# Patient Record
Sex: Female | Born: 1985 | Race: White | Hispanic: No | Marital: Married | Smoking: Former smoker
Health system: Southern US, Community
[De-identification: ages and names within clinical notes are randomized; demographics above are authoritative.]

## PROBLEM LIST (undated history)

## (undated) ENCOUNTER — Inpatient Hospital Stay (HOSPITAL_COMMUNITY): Payer: Self-pay

## (undated) DIAGNOSIS — R768 Other specified abnormal immunological findings in serum: Secondary | ICD-10-CM

## (undated) DIAGNOSIS — R87629 Unspecified abnormal cytological findings in specimens from vagina: Secondary | ICD-10-CM

## (undated) DIAGNOSIS — F419 Anxiety disorder, unspecified: Secondary | ICD-10-CM

## (undated) DIAGNOSIS — O039 Complete or unspecified spontaneous abortion without complication: Secondary | ICD-10-CM

## (undated) HISTORY — DX: Anxiety disorder, unspecified: F41.9

## (undated) HISTORY — PX: CERVICAL BIOPSY  W/ LOOP ELECTRODE EXCISION: SUR135

## (undated) HISTORY — PX: OTHER SURGICAL HISTORY: SHX169

## (undated) HISTORY — PX: WISDOM TOOTH EXTRACTION: SHX21

---

## 2007-05-26 ENCOUNTER — Emergency Department (HOSPITAL_COMMUNITY): Admission: EM | Admit: 2007-05-26 | Discharge: 2007-05-26 | Payer: Self-pay | Admitting: Emergency Medicine

## 2007-10-17 ENCOUNTER — Ambulatory Visit: Payer: Self-pay | Admitting: Obstetrics & Gynecology

## 2007-10-19 ENCOUNTER — Ambulatory Visit (HOSPITAL_COMMUNITY): Admission: RE | Admit: 2007-10-19 | Discharge: 2007-10-19 | Payer: Self-pay | Admitting: Obstetrics and Gynecology

## 2007-10-24 ENCOUNTER — Ambulatory Visit: Payer: Self-pay | Admitting: *Deleted

## 2007-10-25 ENCOUNTER — Ambulatory Visit: Payer: Self-pay | Admitting: Obstetrics & Gynecology

## 2007-10-25 ENCOUNTER — Inpatient Hospital Stay (HOSPITAL_COMMUNITY): Admission: AD | Admit: 2007-10-25 | Discharge: 2007-10-25 | Payer: Self-pay | Admitting: Obstetrics & Gynecology

## 2007-10-25 ENCOUNTER — Inpatient Hospital Stay (HOSPITAL_COMMUNITY): Admission: AD | Admit: 2007-10-25 | Discharge: 2007-10-28 | Payer: Self-pay | Admitting: Obstetrics & Gynecology

## 2007-11-13 ENCOUNTER — Other Ambulatory Visit: Payer: Self-pay | Admitting: Family Medicine

## 2007-11-13 ENCOUNTER — Ambulatory Visit: Payer: Self-pay | Admitting: Gynecology

## 2007-11-13 ENCOUNTER — Observation Stay (HOSPITAL_COMMUNITY): Admission: AD | Admit: 2007-11-13 | Discharge: 2007-11-14 | Payer: Self-pay | Admitting: Gynecology

## 2007-11-13 ENCOUNTER — Other Ambulatory Visit: Payer: Self-pay

## 2008-02-11 ENCOUNTER — Emergency Department (HOSPITAL_COMMUNITY): Admission: EM | Admit: 2008-02-11 | Discharge: 2008-02-11 | Payer: Self-pay | Admitting: Emergency Medicine

## 2008-03-28 IMAGING — US US OB LIMITED
1 series · 14 of 19 positions shown · non-contrast
Comparison: None.

CLINICAL DATA: Abdominal pain following an injury. Approximately 5 months
pregnant.

LIMITED OBSTETRICAL ULTRASOUND

[Series 1: unknown · 0.32mm/px · 14 of 19 slices shown]
[im 1/19]
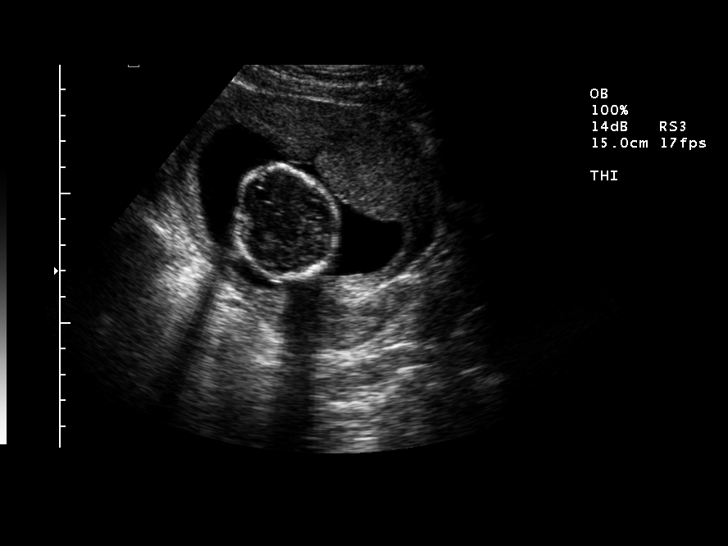
[im 3/19]
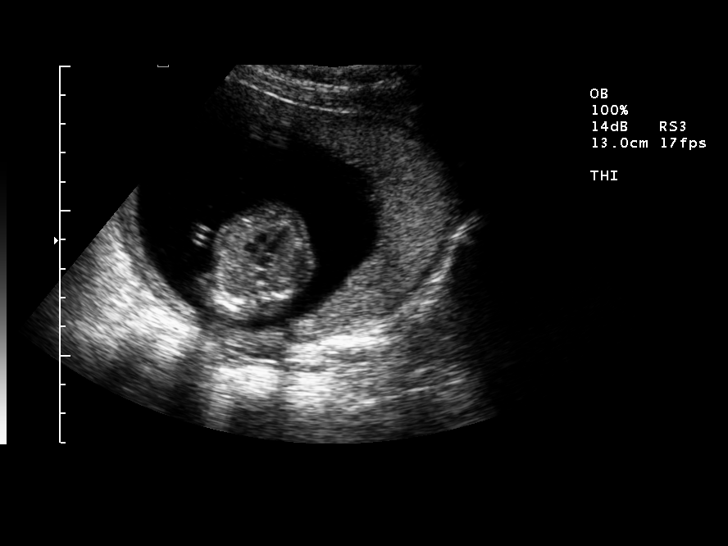
[im 4/19]
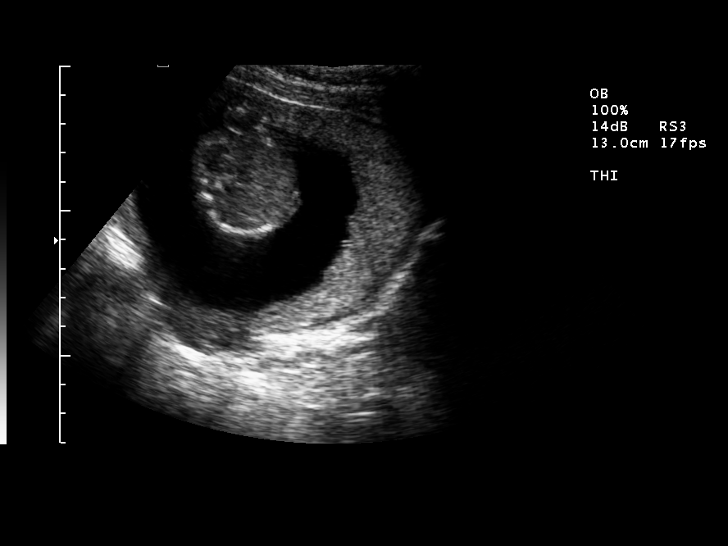
[im 5/19]
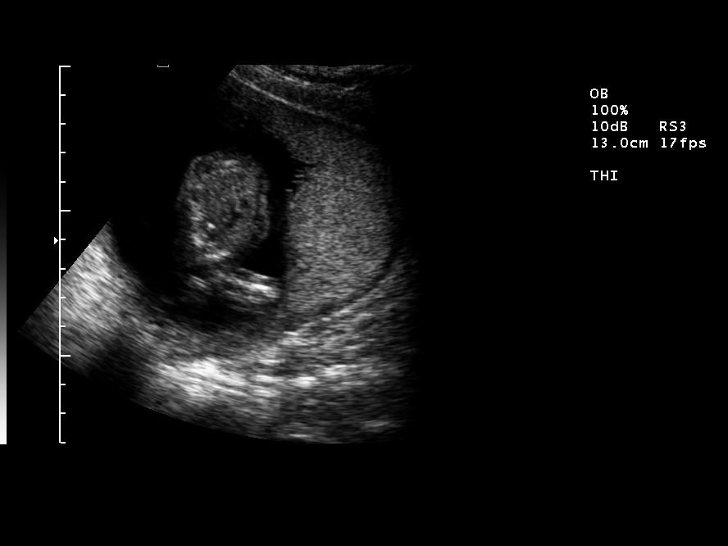
[im 7/19]
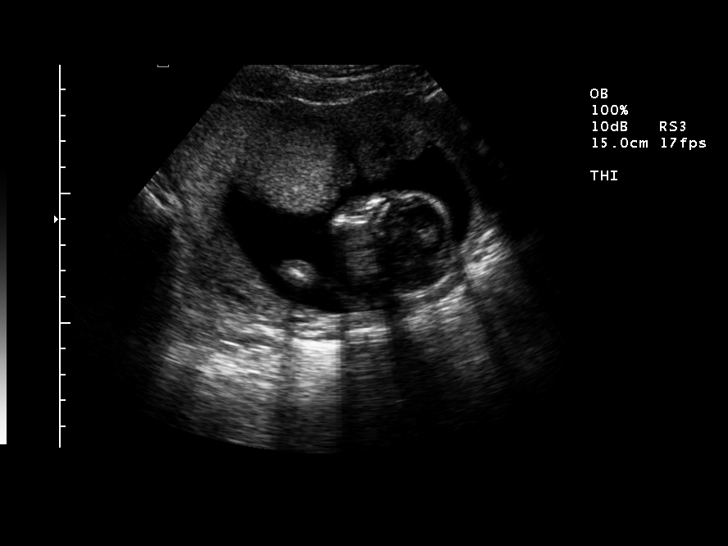
[im 8/19]
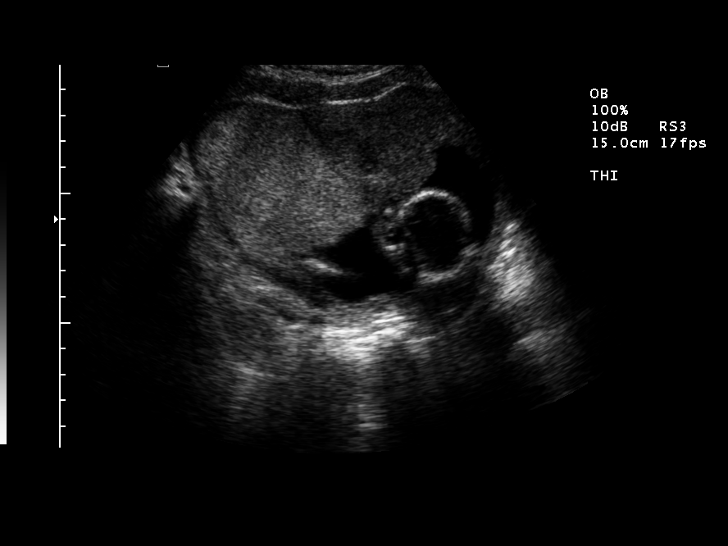
[im 9/19]
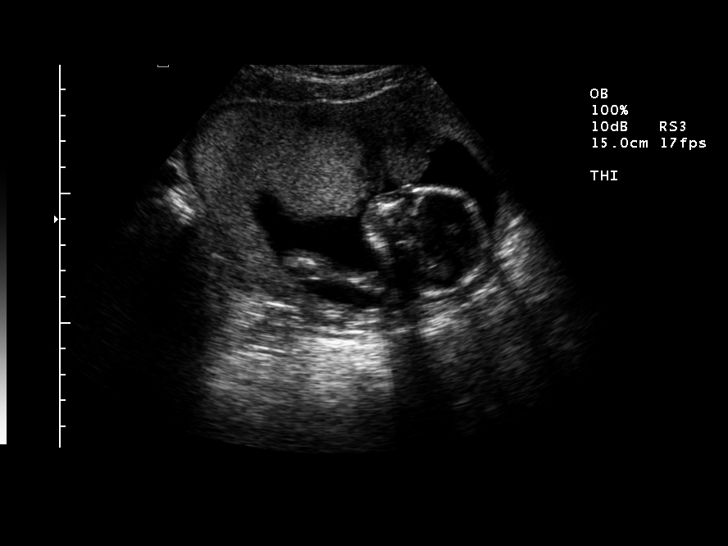
[im 11/19]
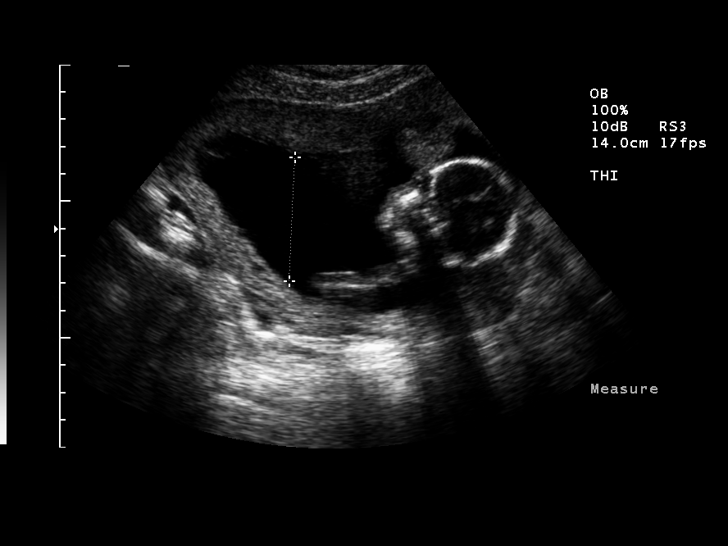
[im 12/19]
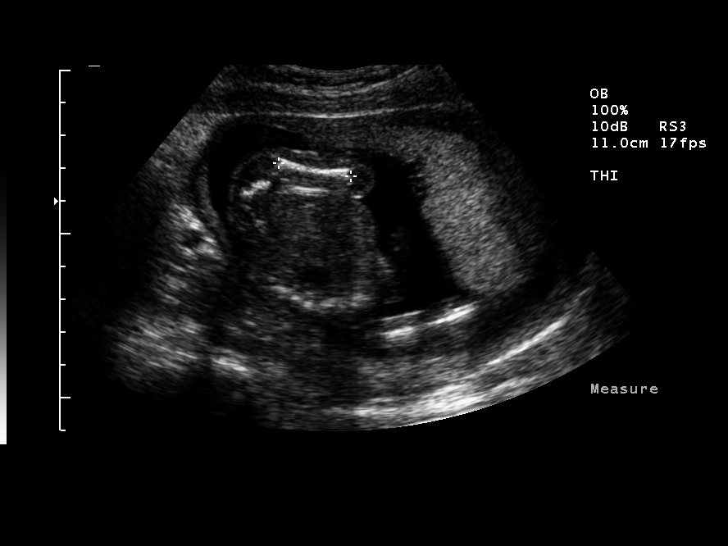
[im 13/19]
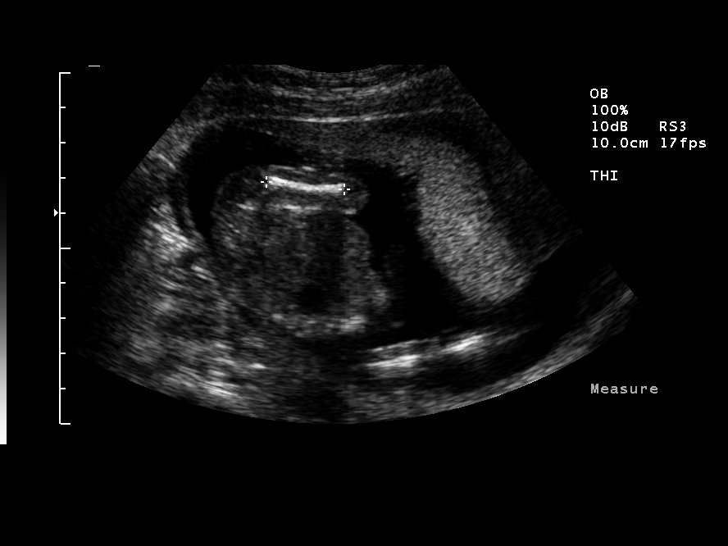
[im 15/19]
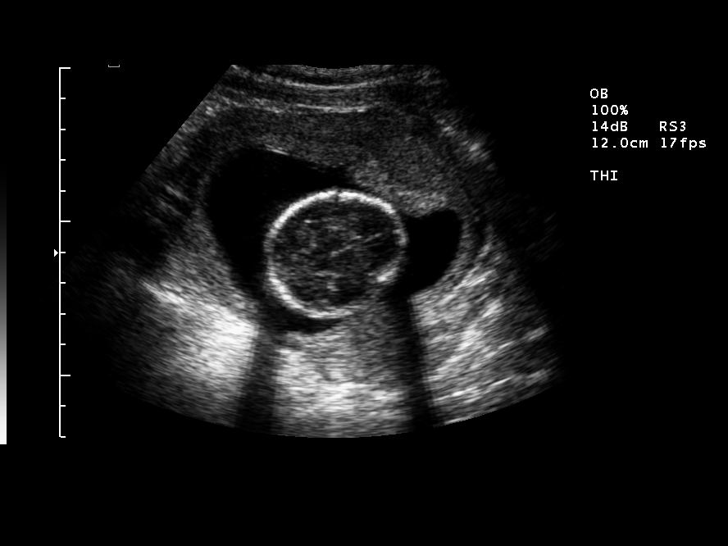
[im 16/19]
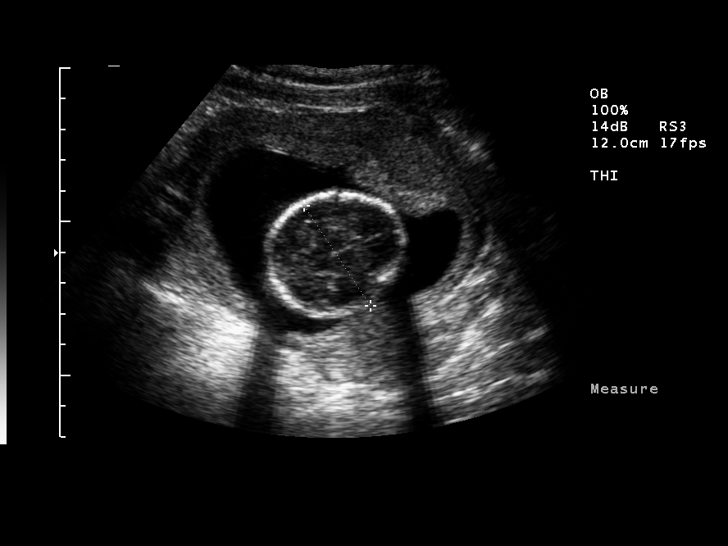
[im 17/19]
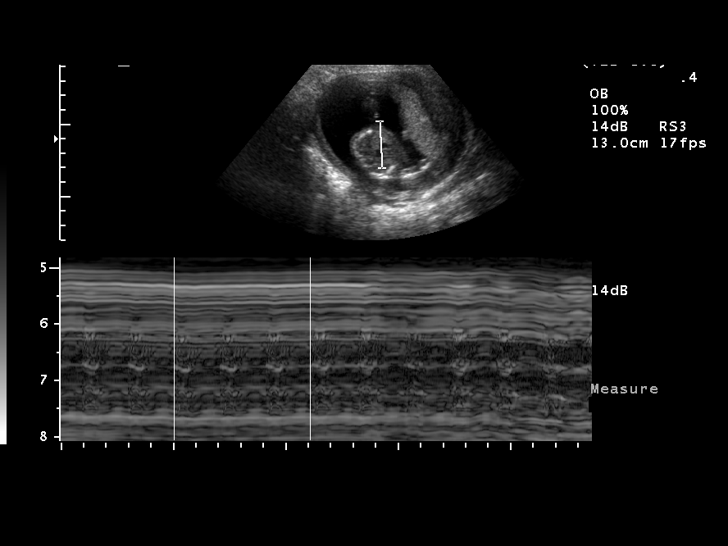
[im 19/19]
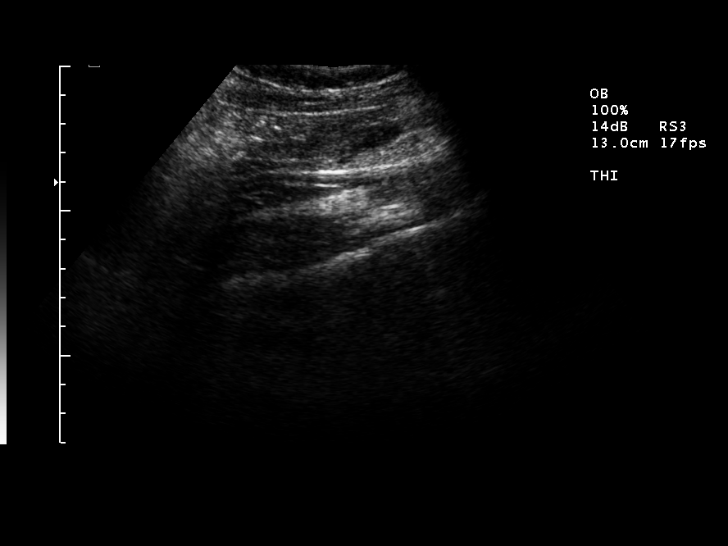

[14 of 19 positions shown; findings below may reference images not displayed]

FINDINGS: Transabdominal sonographic examination of the gravid uterus
demonstrates a single intrauterine gestation in a transverse presentation. A
normal amount of amniotic fluid is present and the placenta is located laterally
on the right without evidence of previa. Normal fetal cardiac and limb activity
with a fetal heart rate of 149 beats per minute.

The fetal BPD is 3.9 cm, giving an estimated gestational age of 17 weeks and 6
days. The femur length is 2.23 cm, giving an estimated gestational age of 16
weeks and 5 days. The maternal cervix is closed. Neither maternal ovary was
visualized.

IMPRESSION

Single live intrauterine gestation in a transverse presentation with an
estimated gestational age by today's measurements of 17 weeks and 2 days. No
complicating features visualized.

## 2008-08-21 IMAGING — US US OB COMP +14 WK
1 series · 14 of 28 positions shown · non-contrast
Comparison: none

OBSTETRICAL ULTRASOUND:

 This ultrasound exam was performed in the [HOSPITAL] Ultrasound Department.  The OB US report was generated in the AS system, and faxed to the ordering physician.  This report is also available in [REDACTED] PACS.

[Series 1: us ob comp +14 wk · 0.28mm/px · 33 acquisitions, 14 frames shown]
[im 2/33]
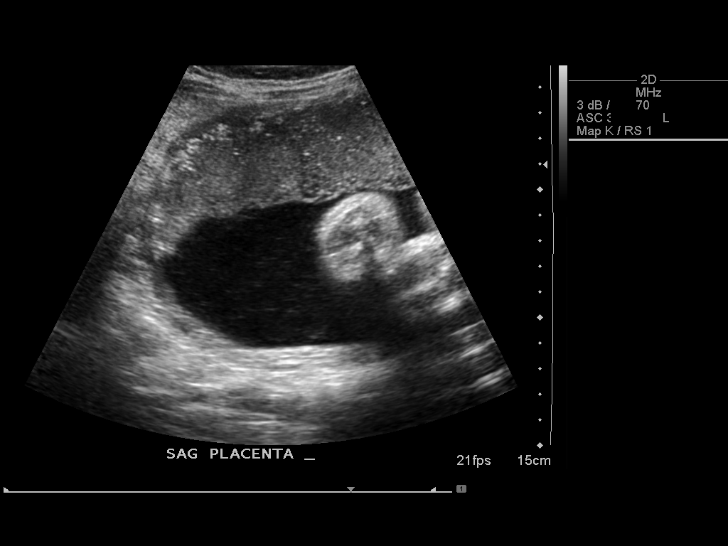
[im 4/33]
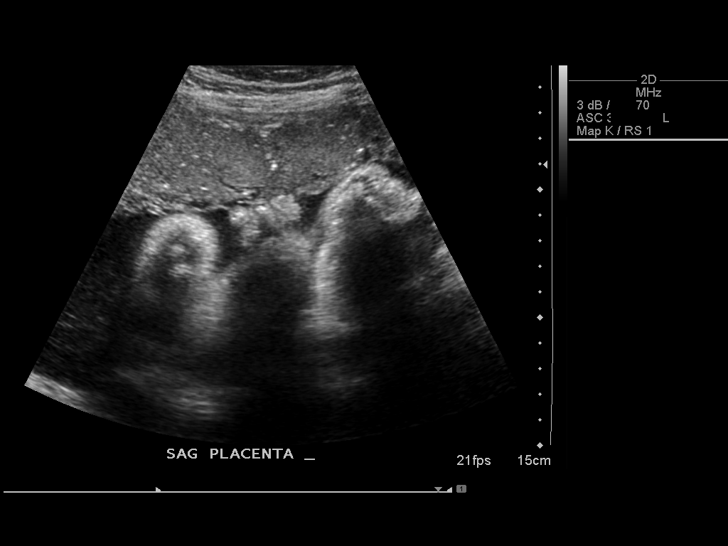
[im 6/33]
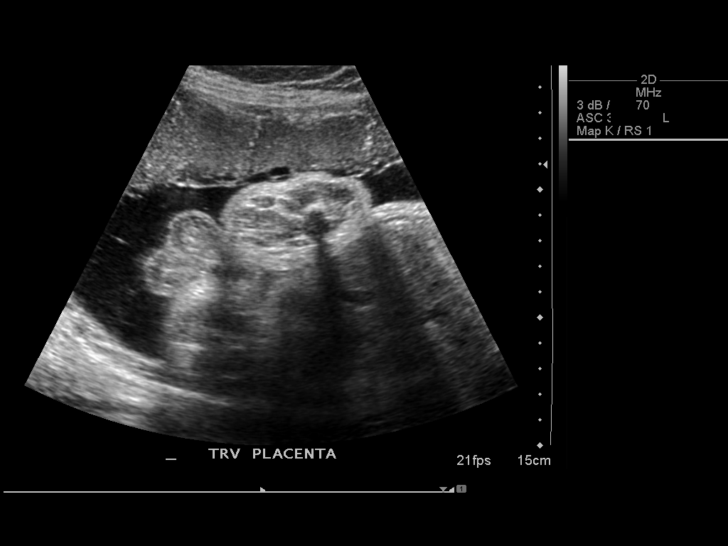
[im 9/33]
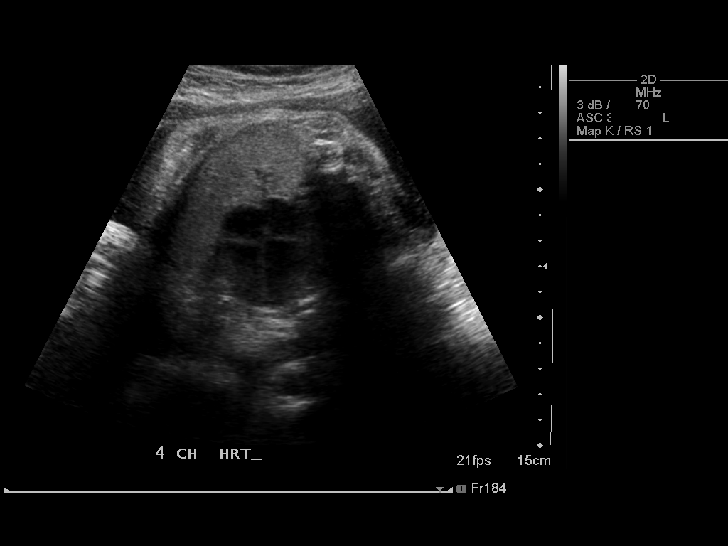
[im 11/33]
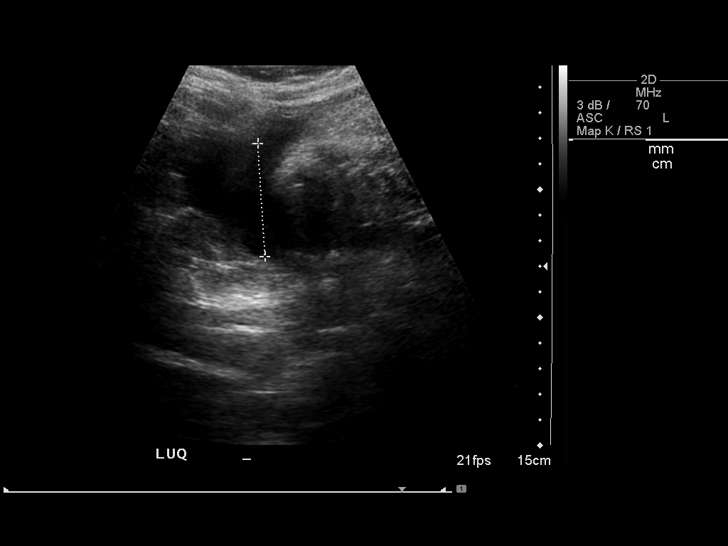
[im 14/33]
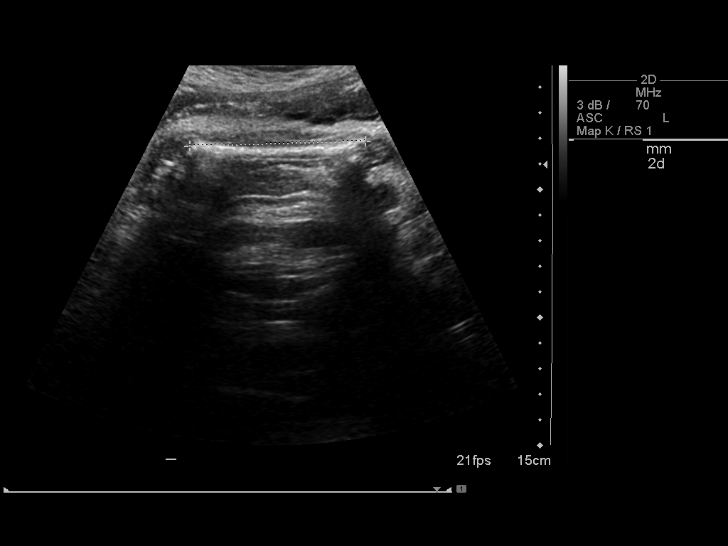
[im 16/33]
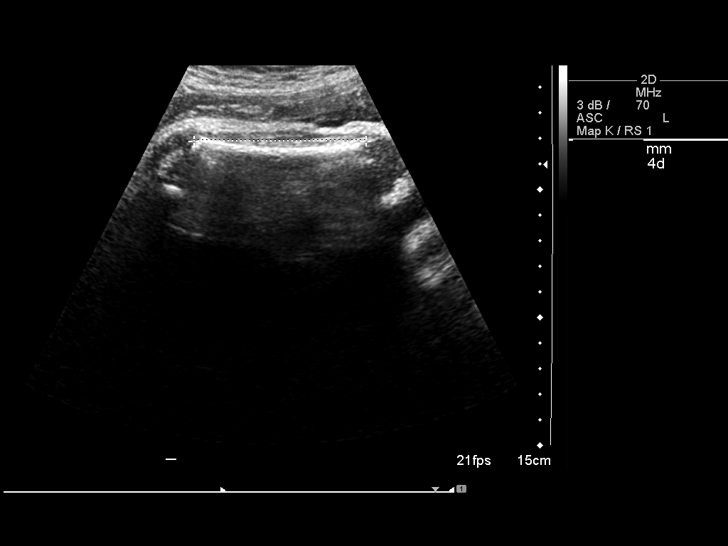
[im 18/33]
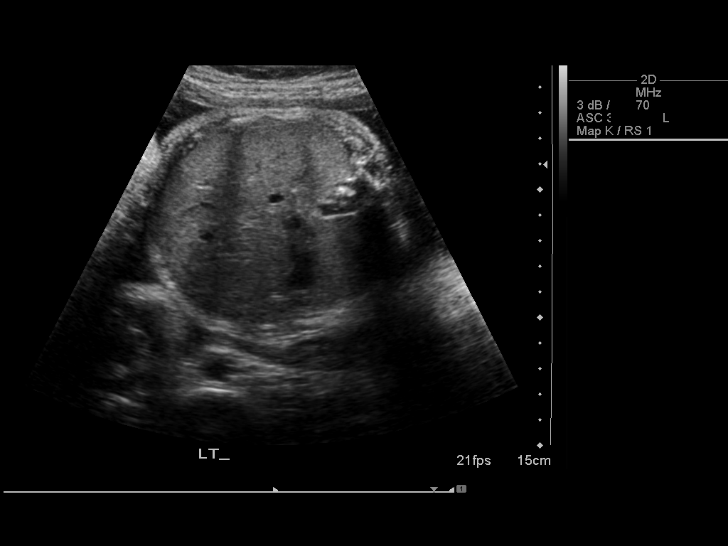
[im 21/33]
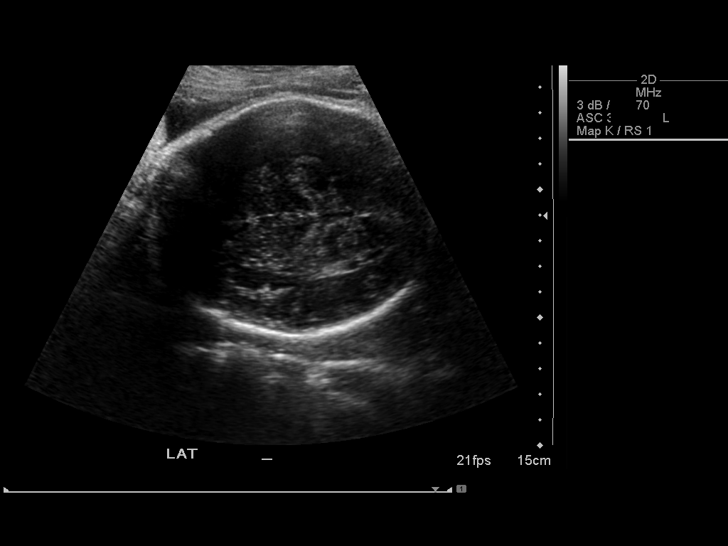
[im 23/33]
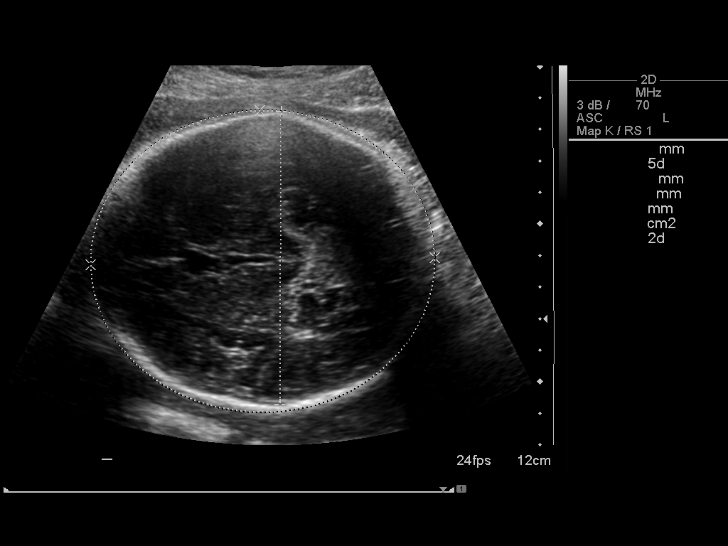
[im 25/33]
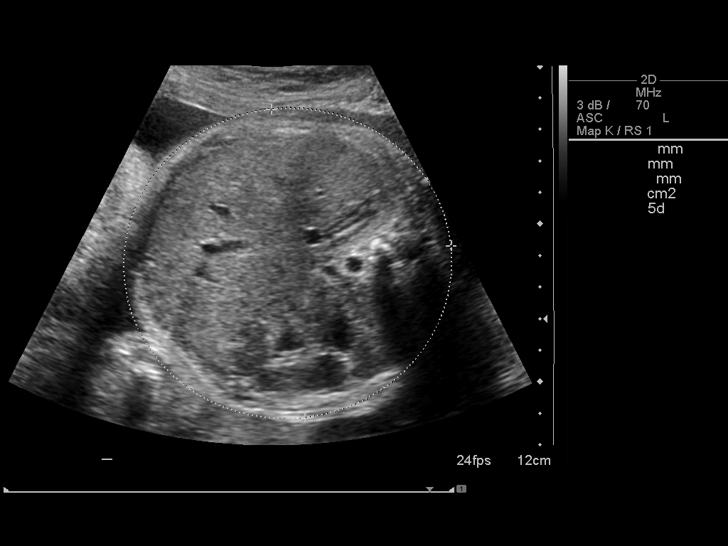
[im 28/33]
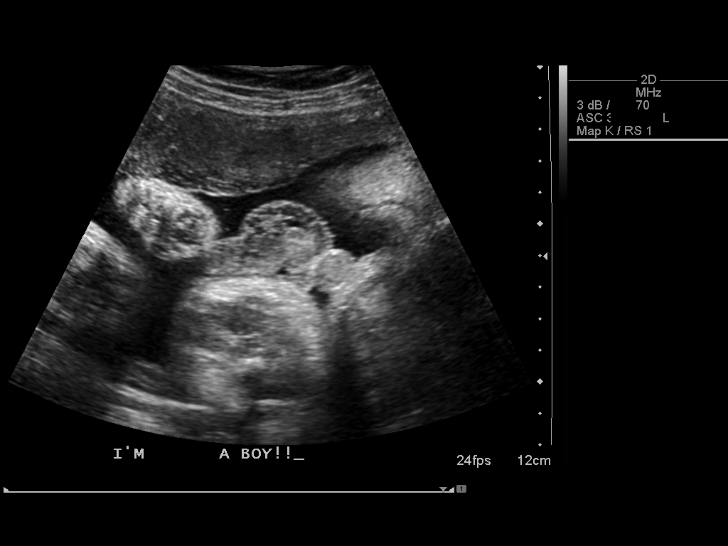
[im 30/33]
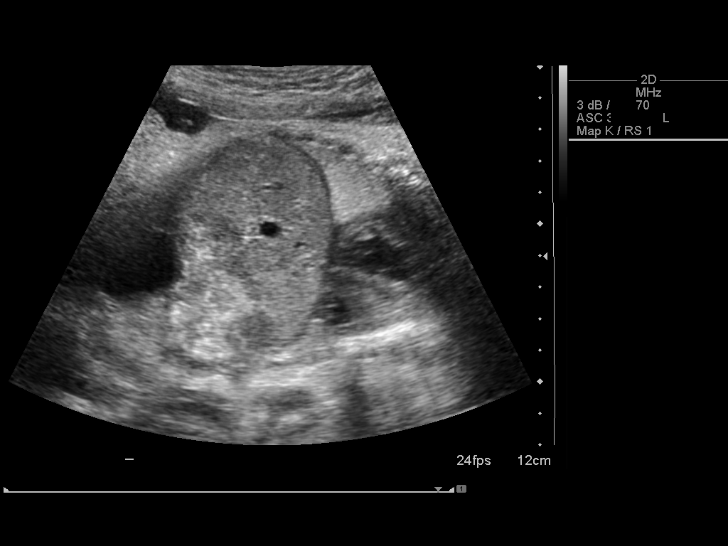
[im 33/33]
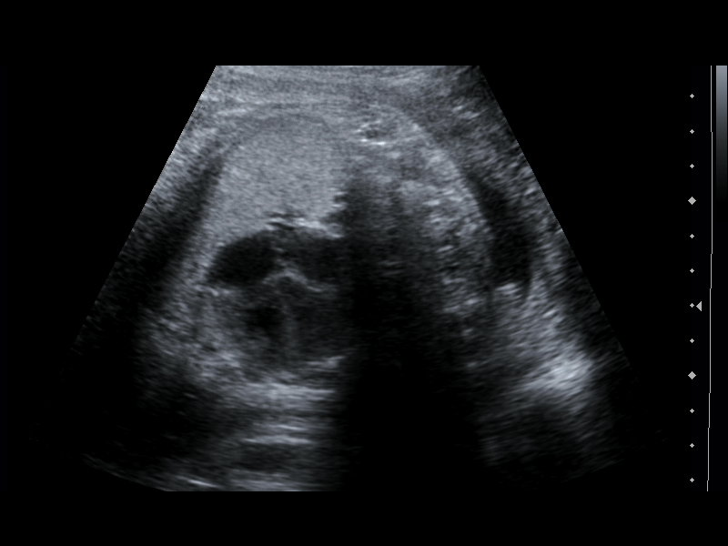

[14 of 28 positions shown; findings below may reference images not displayed]

IMPRESSION: See [facility] Obstetric US report.

## 2008-09-15 IMAGING — US US TRANSVAGINAL NON-OB
1 series · 14 of 25 positions shown · non-contrast
Comparison: none

CLINICAL DATA: Postpartum with abdominal pain.  Rule out retained products of conception.  
TRANSABDOMINAL AND TRANSVAGINAL PELVIC ULTRASOUND:
TECHNIQUE: Both transabdominal and transvaginal ultrasound examinations of the pelvis were performed including evaluation of the uterus, ovaries, adnexal regions, and pelvic cul-de-sac.

[Series 1: us transvaginal non-ob · 0.28mm/px · 14 of 55 slices shown]
[im 1/55]
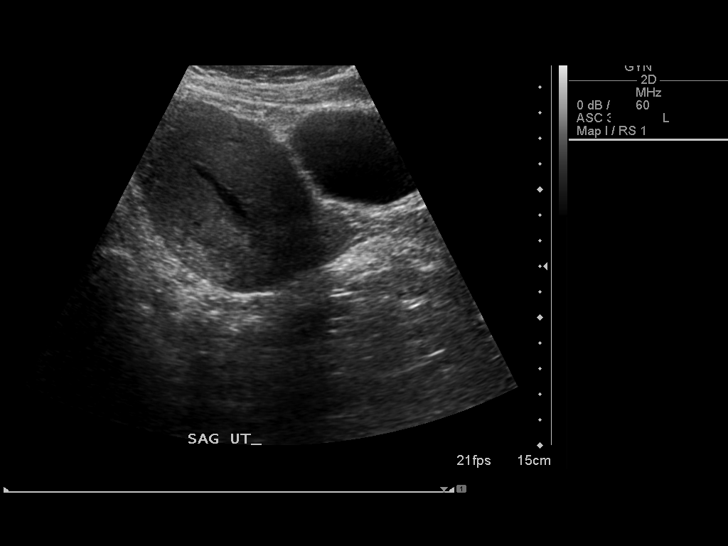
[im 5/55]
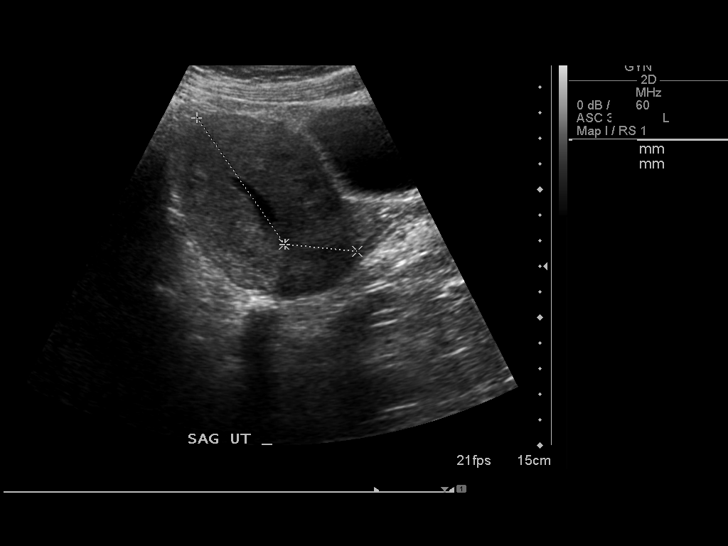
[im 10/55]
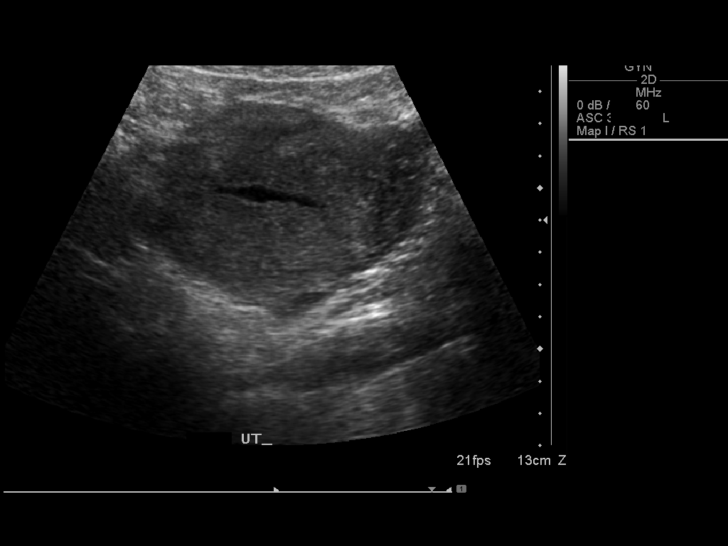
[im 14/55]
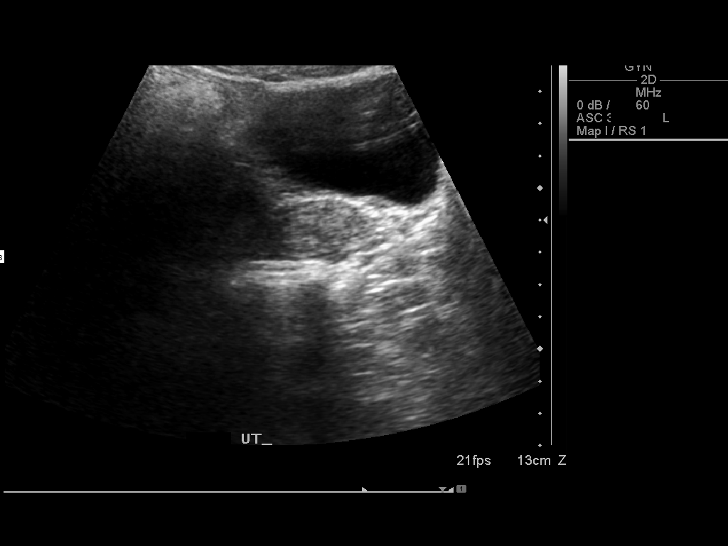
[im 19/55]
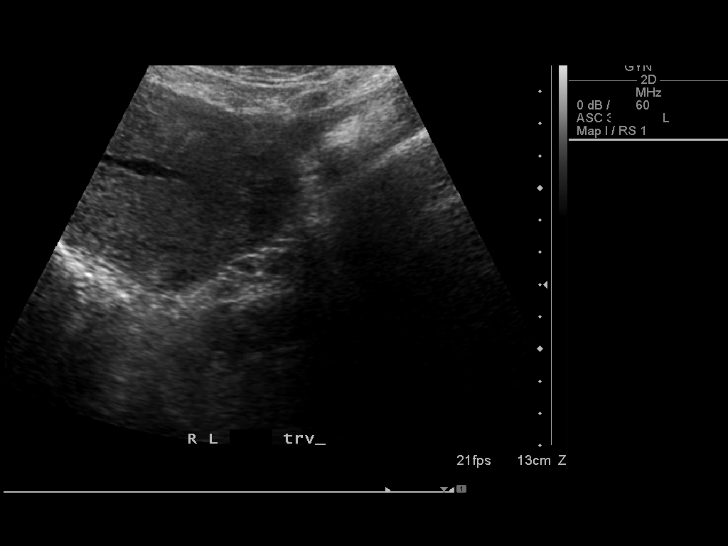
[im 21/55]
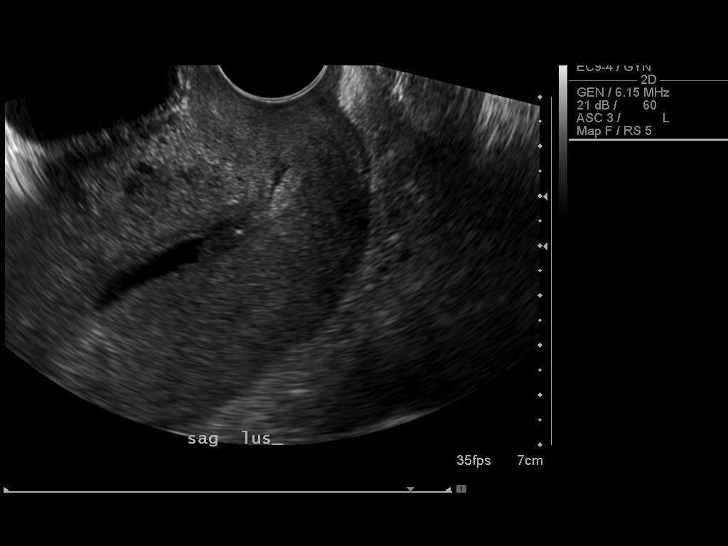
[im 25/55]
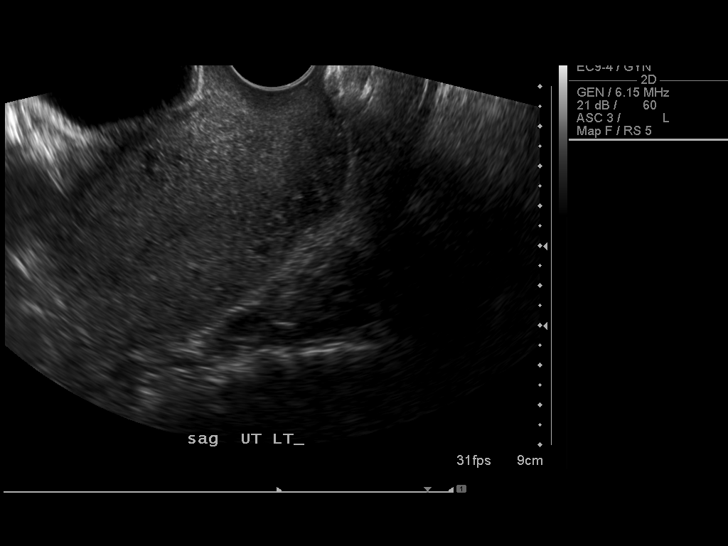
[im 30/55]
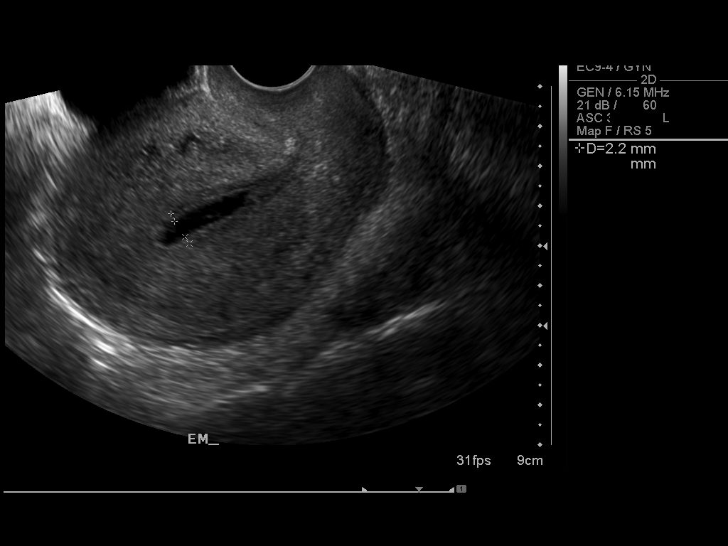
[im 34/55]
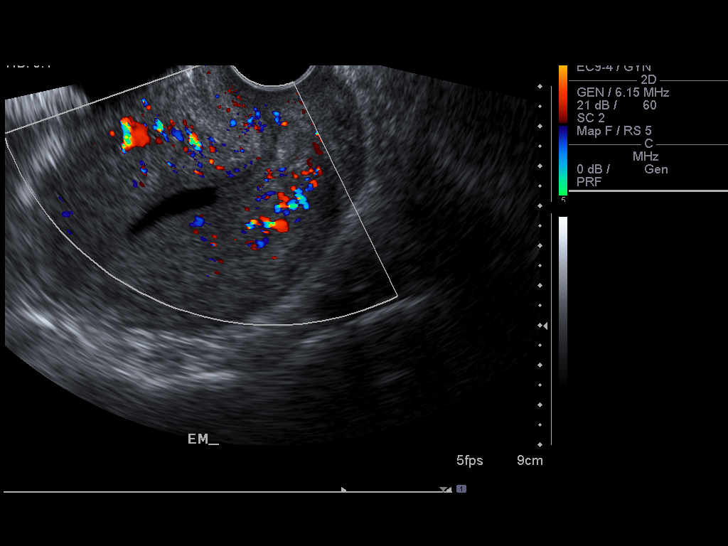
[im 37/55]
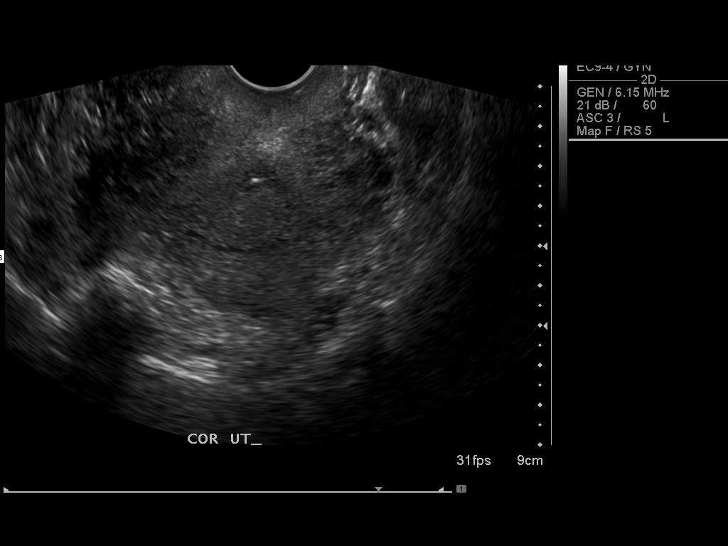
[im 41/55]
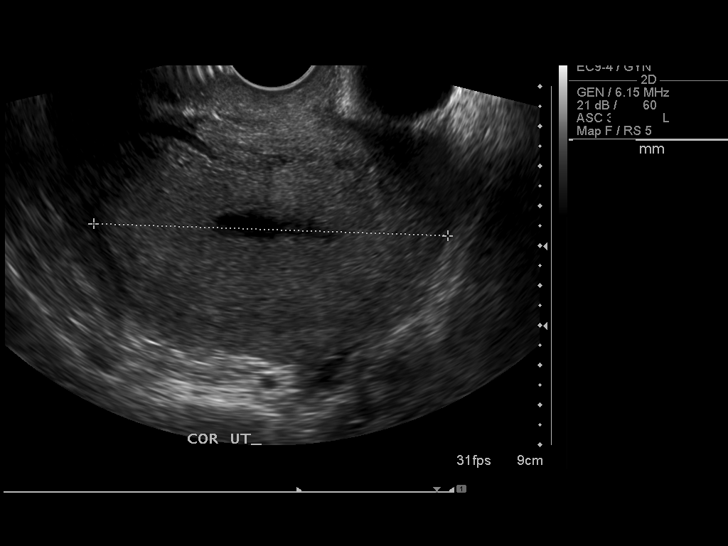
[im 46/55]
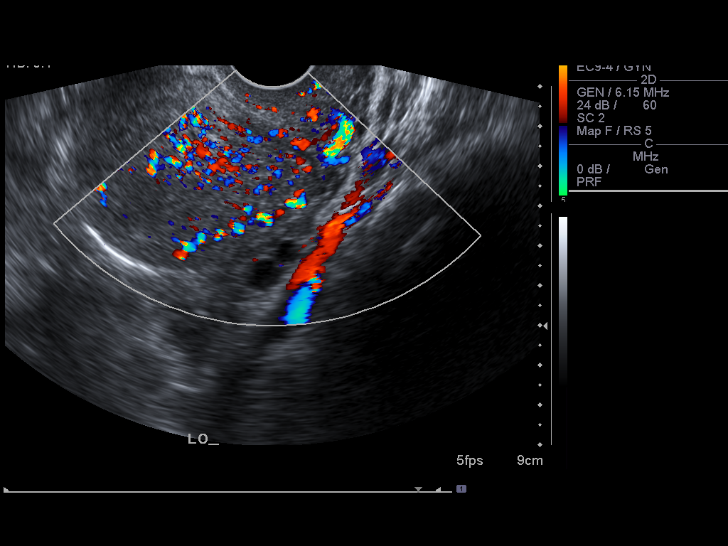
[im 50/55]
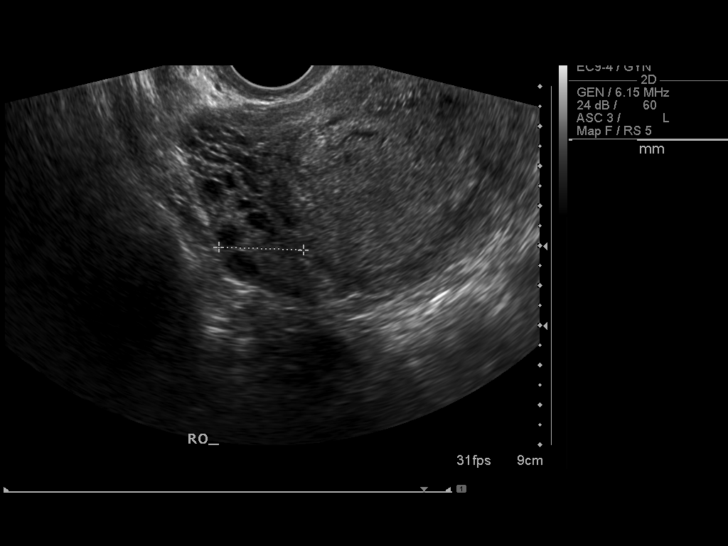
[im 55/55]
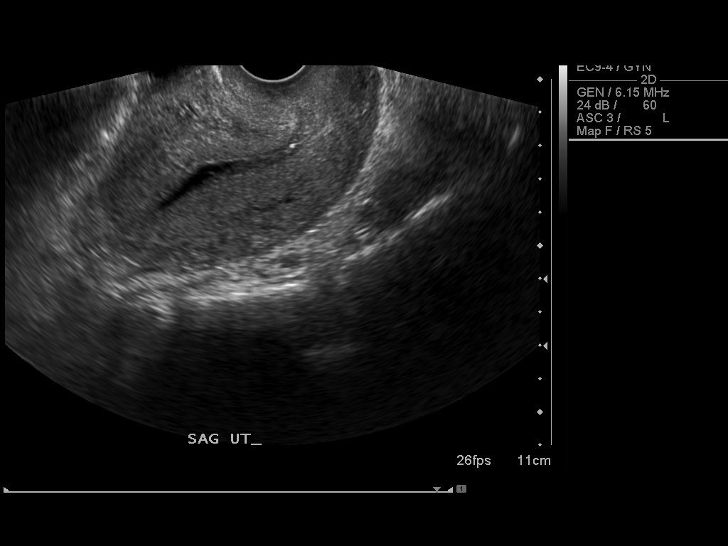

[14 of 25 positions shown; findings below may reference images not displayed]

FINDINGS: Uterus measures 9.0 x 5.6 x 9.0 cm.  There is fluid within the endometrial canal body/fundus region.  Within the lower uterine segment, echogenic material may represent a blood clot.  Small regions of retained products of conception cannot be completely excluded nor can endometritis be excluded based on the current exam.  
Right ovary measures 4.5 x 2.0 x 2.1 cm and the left ovary 3.6 x 1.1 x 1.8 cm.  Scattered small follicles without dominant mass.  No free fluid.
IMPRESSION: Fluid within the endometrial canal at the fundal and body aspect of the uterus.  Below this region in the lower uterine segment, echogenic material is noted.  This may represent clot although retained products of conception cannot be excluded.  Additionally, endometritis cannot be excluded based on these findings.

## 2008-10-07 ENCOUNTER — Emergency Department (HOSPITAL_COMMUNITY): Admission: EM | Admit: 2008-10-07 | Discharge: 2008-10-07 | Payer: Self-pay | Admitting: Emergency Medicine

## 2011-06-23 LAB — CBC
MCHC: 33.7
MCV: 86.6
Platelets: 300
RDW: 13.7
WBC: 22.7 — ABNORMAL HIGH

## 2011-06-23 LAB — POCT URINALYSIS DIP (DEVICE)
Operator id: 297281
Operator id: 297281
Protein, ur: NEGATIVE
Protein, ur: NEGATIVE
Specific Gravity, Urine: 1.025
Specific Gravity, Urine: 1.02
Urobilinogen, UA: 0.2
Urobilinogen, UA: 0.2
pH: 6
pH: 7

## 2011-06-23 LAB — RPR: RPR Ser Ql: NONREACTIVE

## 2011-06-23 LAB — RH IMMUNE GLOB WKUP(>/=20WKS)(NOT WOMEN'S HOSP)

## 2011-06-24 LAB — DIFFERENTIAL
Basophils Absolute: 0.1
Eosinophils Absolute: 0
Eosinophils Relative: 0
Lymphocytes Relative: 7 — ABNORMAL LOW
Lymphs Abs: 1
Neutrophils Relative %: 90 — ABNORMAL HIGH

## 2011-06-24 LAB — URINALYSIS, ROUTINE W REFLEX MICROSCOPIC
Nitrite: NEGATIVE
Specific Gravity, Urine: 1.041 — ABNORMAL HIGH
Urobilinogen, UA: 1
pH: 6

## 2011-06-24 LAB — GC/CHLAMYDIA PROBE AMP, GENITAL
Chlamydia, DNA Probe: NEGATIVE
GC Probe Amp, Genital: NEGATIVE

## 2011-06-24 LAB — URINE MICROSCOPIC-ADD ON

## 2011-06-24 LAB — CBC
HCT: 32.6 — ABNORMAL LOW
HCT: 36.1
MCHC: 33.9
MCV: 86.4
Platelets: 280
RBC: 3.77 — ABNORMAL LOW
RDW: 14.4
WBC: 10.5
WBC: 14.4 — ABNORMAL HIGH

## 2011-06-24 LAB — COMPREHENSIVE METABOLIC PANEL
BUN: 7
CO2: 22
Calcium: 7.8 — ABNORMAL LOW
Chloride: 110
Creatinine, Ser: 0.65
GFR calc non Af Amer: >60
Glucose, Bld: 112 — ABNORMAL HIGH
Total Bilirubin: 0.4

## 2011-06-24 LAB — BASIC METABOLIC PANEL
BUN: 9
Creatinine, Ser: 0.79
GFR calc non Af Amer: >60
Glucose, Bld: 102 — ABNORMAL HIGH

## 2011-06-24 LAB — PREGNANCY, URINE: Preg Test, Ur: NEGATIVE

## 2011-06-24 LAB — URINE CULTURE

## 2011-07-15 LAB — URINALYSIS, ROUTINE W REFLEX MICROSCOPIC
Hgb urine dipstick: NEGATIVE
Nitrite: NEGATIVE
Protein, ur: NEGATIVE
Specific Gravity, Urine: 1.017
Urobilinogen, UA: 1

## 2011-07-15 LAB — CBC
MCV: 87.3
Platelets: 245
RBC: 4.05
WBC: 10.2

## 2011-07-15 LAB — DIFFERENTIAL
Eosinophils Absolute: 0
Lymphs Abs: 2
Neutro Abs: 7.8 — ABNORMAL HIGH
Neutrophils Relative %: 77

## 2011-07-15 LAB — HCG, QUANTITATIVE, PREGNANCY: hCG, Beta Chain, Quant, S: 13480 — ABNORMAL HIGH

## 2011-07-15 LAB — PREGNANCY, URINE: Preg Test, Ur: POSITIVE

## 2011-07-15 LAB — ABO/RH: ABO/RH(D): O NEG

## 2014-04-16 ENCOUNTER — Other Ambulatory Visit: Payer: Self-pay

## 2014-04-16 ENCOUNTER — Encounter (HOSPITAL_COMMUNITY): Payer: Self-pay | Admitting: Emergency Medicine

## 2014-04-16 ENCOUNTER — Emergency Department (HOSPITAL_COMMUNITY)
Admission: EM | Admit: 2014-04-16 | Discharge: 2014-04-17 | Disposition: A | Discharge status: Home / Self Care | Payer: Medicaid Other | Attending: Emergency Medicine | Admitting: Emergency Medicine

## 2014-04-16 DIAGNOSIS — N939 Abnormal uterine and vaginal bleeding, unspecified: Principal | ICD-10-CM | POA: Insufficient documentation

## 2014-04-16 DIAGNOSIS — M549 Dorsalgia, unspecified: Secondary | ICD-10-CM | POA: Diagnosis not present

## 2014-04-16 DIAGNOSIS — N926 Irregular menstruation, unspecified: Secondary | ICD-10-CM | POA: Insufficient documentation

## 2014-04-16 DIAGNOSIS — Z3202 Encounter for pregnancy test, result negative: Secondary | ICD-10-CM | POA: Insufficient documentation

## 2014-04-16 DIAGNOSIS — Z8619 Personal history of other infectious and parasitic diseases: Secondary | ICD-10-CM | POA: Insufficient documentation

## 2014-04-16 DIAGNOSIS — R1030 Lower abdominal pain, unspecified: Secondary | ICD-10-CM

## 2014-04-16 DIAGNOSIS — R1031 Right lower quadrant pain: Secondary | ICD-10-CM | POA: Diagnosis not present

## 2014-04-16 DIAGNOSIS — N938 Other specified abnormal uterine and vaginal bleeding: Secondary | ICD-10-CM

## 2014-04-16 DIAGNOSIS — N898 Other specified noninflammatory disorders of vagina: Secondary | ICD-10-CM | POA: Diagnosis present

## 2014-04-16 DIAGNOSIS — F17200 Nicotine dependence, unspecified, uncomplicated: Secondary | ICD-10-CM | POA: Diagnosis not present

## 2014-04-16 DIAGNOSIS — R109 Unspecified abdominal pain: Secondary | ICD-10-CM | POA: Diagnosis not present

## 2014-04-16 DIAGNOSIS — R000 Tachycardia, unspecified: Secondary | ICD-10-CM | POA: Diagnosis not present

## 2014-04-16 HISTORY — DX: Other specified abnormal immunological findings in serum: R76.8

## 2014-04-16 LAB — BASIC METABOLIC PANEL
ANION GAP: 13 (ref 5–15)
BUN: 11 mg/dL (ref 6–23)
CO2: 22 meq/L (ref 19–32)
CREATININE: 0.8 mg/dL (ref 0.50–1.10)
Calcium: 9.9 mg/dL (ref 8.4–10.5)
Chloride: 105 mEq/L (ref 96–112)
GFR calc Af Amer: >90 mL/min (ref >90)
GFR calc non Af Amer: >90 mL/min (ref >90)
Glucose, Bld: 110 mg/dL — ABNORMAL HIGH (ref 70–99)
Potassium: 3.9 mEq/L (ref 3.7–5.3)
Sodium: 140 mEq/L (ref 137–147)

## 2014-04-16 LAB — CBC
HEMATOCRIT: 45.4 % (ref 36.0–46.0)
HEMOGLOBIN: 15.2 g/dL — AB (ref 12.0–15.0)
MCH: 30.6 pg (ref 26.0–34.0)
MCHC: 33.5 g/dL (ref 30.0–36.0)
MCV: 91.5 fL (ref 78.0–100.0)
Platelets: 294 10*3/uL (ref 150–400)
RBC: 4.96 MIL/uL (ref 3.87–5.11)
RDW: 13 % (ref 11.5–15.5)
WBC: 15.8 10*3/uL — AB (ref 4.0–10.5)

## 2014-04-16 LAB — WET PREP, GENITAL
TRICH WET PREP: NONE SEEN
Yeast Wet Prep HPF POC: NONE SEEN

## 2014-04-16 LAB — POC URINE PREG, ED: Preg Test, Ur: NEGATIVE

## 2014-04-16 MED ORDER — SODIUM CHLORIDE 0.9 % IV BOLUS (SEPSIS)
1000.0000 mL | Freq: Once | INTRAVENOUS | Status: AC
  Administered 2014-04-16: 1000 mL via INTRAVENOUS

## 2014-04-16 MED ORDER — IBUPROFEN 200 MG PO TABS
600.0000 mg | ORAL_TABLET | Freq: Once | ORAL | Status: AC
  Administered 2014-04-16: 600 mg via ORAL
  Filled 2014-04-16: qty 3

## 2014-04-16 NOTE — ED Notes (Signed)
RN at bedside starting IV 

## 2014-04-16 NOTE — ED Notes (Signed)
Pt states she has been on her period for going on 4 mths  Pt states she has pain in her back and also having sharp shooting pain in her pelvic region   Pt states she thinks she may also have a sinus infection

## 2014-04-16 NOTE — ED Provider Notes (Signed)
CSN: [id number]     Arrival date & time 04/16/14  1951 History   First MD Initiated Contact with Patient 04/16/14 2221     Chief Complaint  Patient presents with  . Vaginal Bleeding     (Consider location/radiation/quality/duration/timing/severity/associated sxs/prior Treatment) HPI 28 year old female presents with vaginal bleeding for the last 4 months. She states she's feeling she's been on her period this entire time. She goes through a pad every couple days. Sometimes she has clots. No urinary symptoms but she questions if she's having a UTI due to low back pain and lower abdominal pain for the past month. Pain is a 7/10. No fevers. No vomiting. Also having a "sinus infection" since over 1 year ago but states no medicines work for her. No congestion. Dry cough for over one year, mostly at night. Is a smoker. States her HR intermittently "goes out of rhythm" due to her mother doing cocaine during her pregnancy.  Past Medical History  Diagnosis Date  . Hepatitis B antibody positive    Past Surgical History  Procedure Laterality Date  . Extraction of wisdom teeth     Family History  Problem Relation Age of Onset  . Adopted: Yes   History  Substance Use Topics  . Smoking status: Current Every Day Smoker -- 0.25 packs/day  . Smokeless tobacco: Not on file  . Alcohol Use: Yes     Comment: rare   OB History   Grav Para Term Preterm Abortions TAB SAB Ect Mult Living                 Review of Systems  Constitutional: Negative for fever.  Gastrointestinal: Positive for abdominal pain. Negative for vomiting.  Genitourinary: Positive for vaginal bleeding and menstrual problem.  Musculoskeletal: Positive for back pain.  All other systems reviewed and are negative.     Allergies  Toradol; Augmentin; Naldecon senior; Ultram; Careers adviserAllegra; Benadryl; Sudafed; and Zyrtec  Home Medications   Prior to Admission medications   Medication Sig Start Date End Date Taking? Authorizing  Provider  etonogestrel (NEXPLANON) 68 MG IMPL implant Inject 1 each into the skin once.   Yes Historical Provider, MD   BP 139/81  Pulse 137  Temp(Src) 98.6 F (37 C) (Oral)  Resp 20  SpO2 100% Physical Exam  Nursing note and vitals reviewed. Constitutional: She is oriented to person, place, and time. She appears well-developed and well-nourished. No distress.  Laying in bed comfortably  HENT:  Head: Normocephalic and atraumatic.  Right Ear: External ear normal.  Left Ear: External ear normal.  Nose: Nose normal.  Eyes: Right eye exhibits no discharge. Left eye exhibits no discharge.  Cardiovascular: Regular rhythm and normal heart sounds.  Tachycardia present.   Pulmonary/Chest: Effort normal and breath sounds normal.  Abdominal: Soft. There is tenderness (mild) in the right lower quadrant and suprapubic area.  Genitourinary: Cervix exhibits no motion tenderness. Right adnexum displays no mass. Left adnexum displays no mass. There is bleeding (mild blood in vault) around the vagina.  No active bleeding  Neurological: She is alert and oriented to person, place, and time.  Skin: Skin is warm and dry.    ED Course  Procedures (including critical care time) Labs Review Labs Reviewed  WET PREP, GENITAL - Abnormal; Notable for the following:    Clue Cells Wet Prep HPF POC FEW (*)    WBC, Wet Prep HPF POC RARE (*)    All other components within normal limits  CBC - Abnormal;  Notable for the following:    WBC 15.8 (*)    Hemoglobin 15.2 (*)    All other components within normal limits  BASIC METABOLIC PANEL - Abnormal; Notable for the following:    Glucose, Bld 110 (*)    All other components within normal limits  GC/CHLAMYDIA PROBE AMP  URINALYSIS, ROUTINE W REFLEX MICROSCOPIC  POC URINE PREG, ED    Imaging Review No results found.   Date: 04/16/2014  Rate: 107  Rhythm: sinus tachycardia  QRS Axis: normal  Intervals: normal  ST/T Wave abnormalities: normal   Conduction Disutrbances:none  Narrative Interpretation:   Old EKG Reviewed: none available    MDM   Final diagnoses:  Dysfunctional uterine bleeding  Lower abdominal pain    Patient with multiple complaints, each at least one month in duration. She's not having any signs of symptomatic anemia. She is quite tachycardic in triage, however on recheck RA is just over 100. She was given IV fluids although it times her heart rate would jump up without symptoms. It was then returned to normal. EKG shows sinus rhythm. She's not having any chest symptoms. No signs of this is from anemia as her hemoglobin is 15. Exam is unconcerning at this time. Patient is stable for discharge. U/A pending at time of care being transferred to Dr. Hyacinth Meeker. Plan is for discharge with addition of antibiotics is urine is c/w a UTI. Will refer to GYN.    Audree Camel, MD 04/17/14 [phone]

## 2014-04-17 LAB — URINALYSIS, ROUTINE W REFLEX MICROSCOPIC
Bilirubin Urine: NEGATIVE
Glucose, UA: NEGATIVE mg/dL
Ketones, ur: NEGATIVE mg/dL
Leukocytes, UA: NEGATIVE
NITRITE: NEGATIVE
Protein, ur: NEGATIVE mg/dL
SPECIFIC GRAVITY, URINE: 1.009 (ref 1.005–1.030)
UROBILINOGEN UA: 0.2 mg/dL (ref 0.0–1.0)
pH: 5.5 (ref 5.0–8.0)

## 2014-04-17 LAB — GC/CHLAMYDIA PROBE AMP
CT Probe RNA: NEGATIVE
GC Probe RNA: NEGATIVE

## 2014-04-17 LAB — URINE MICROSCOPIC-ADD ON

## 2014-04-17 MED ORDER — HYDROCODONE-ACETAMINOPHEN 5-325 MG PO TABS: 2.0000 | ORAL_TABLET | ORAL | Status: DC | PRN

## 2014-04-17 MED ORDER — AZITHROMYCIN 250 MG PO TABS: 250.0000 mg | ORAL_TABLET | Freq: Every day | ORAL | Status: DC

## 2014-04-17 MED ORDER — HYDROCODONE-ACETAMINOPHEN 5-325 MG PO TABS
2.0000 | ORAL_TABLET | Freq: Once | ORAL | Status: AC
  Administered 2014-04-17: 2 via ORAL
  Filled 2014-04-17: qty 2

## 2014-10-11 ENCOUNTER — Emergency Department (HOSPITAL_COMMUNITY): Payer: Medicaid Other

## 2014-10-11 ENCOUNTER — Encounter (HOSPITAL_COMMUNITY): Payer: Self-pay | Admitting: Emergency Medicine

## 2014-10-11 ENCOUNTER — Emergency Department (HOSPITAL_COMMUNITY)
Admission: EM | Admit: 2014-10-11 | Discharge: 2014-10-11 | Disposition: A | Discharge status: Home / Self Care | Payer: Medicaid Other | Attending: Emergency Medicine | Admitting: Emergency Medicine

## 2014-10-11 DIAGNOSIS — R05 Cough: Secondary | ICD-10-CM | POA: Diagnosis present

## 2014-10-11 DIAGNOSIS — Z3202 Encounter for pregnancy test, result negative: Secondary | ICD-10-CM | POA: Insufficient documentation

## 2014-10-11 DIAGNOSIS — J189 Pneumonia, unspecified organism: Secondary | ICD-10-CM | POA: Insufficient documentation

## 2014-10-11 DIAGNOSIS — Z792 Long term (current) use of antibiotics: Secondary | ICD-10-CM | POA: Diagnosis not present

## 2014-10-11 DIAGNOSIS — Z79899 Other long term (current) drug therapy: Secondary | ICD-10-CM | POA: Insufficient documentation

## 2014-10-11 DIAGNOSIS — Z720 Tobacco use: Secondary | ICD-10-CM | POA: Diagnosis not present

## 2014-10-11 LAB — CBC WITH DIFFERENTIAL/PLATELET
Basophils Absolute: 0 10*3/uL (ref 0.0–0.1)
Basophils Relative: 0 % (ref 0–1)
EOS ABS: 0 10*3/uL (ref 0.0–0.7)
EOS PCT: 1 % (ref 0–5)
HEMATOCRIT: 39.3 % (ref 36.0–46.0)
HEMOGLOBIN: 12.5 g/dL (ref 12.0–15.0)
Lymphocytes Relative: 28 % (ref 12–46)
Lymphs Abs: 2.2 10*3/uL (ref 0.7–4.0)
MCH: 28.9 pg (ref 26.0–34.0)
MCHC: 31.8 g/dL (ref 30.0–36.0)
MCV: 91 fL (ref 78.0–100.0)
Monocytes Absolute: 0.6 10*3/uL (ref 0.1–1.0)
Monocytes Relative: 8 % (ref 3–12)
NEUTROS ABS: 4.9 10*3/uL (ref 1.7–7.7)
Neutrophils Relative %: 63 % (ref 43–77)
Platelets: 239 10*3/uL (ref 150–400)
RBC: 4.32 MIL/uL (ref 3.87–5.11)
RDW: 14.2 % (ref 11.5–15.5)
WBC: 7.7 10*3/uL (ref 4.0–10.5)

## 2014-10-11 LAB — COMPREHENSIVE METABOLIC PANEL
ALBUMIN: 4 g/dL (ref 3.5–5.2)
ALK PHOS: 62 U/L (ref 39–117)
ALT: 18 U/L (ref 0–35)
AST: 21 U/L (ref 0–37)
Anion gap: 9 (ref 5–15)
BILIRUBIN TOTAL: 0.7 mg/dL (ref 0.3–1.2)
BUN: 13 mg/dL (ref 6–23)
CALCIUM: 8.7 mg/dL (ref 8.4–10.5)
CO2: 24 mmol/L (ref 19–32)
Chloride: 106 mEq/L (ref 96–112)
Creatinine, Ser: 0.81 mg/dL (ref 0.50–1.10)
GFR calc Af Amer: >90 mL/min (ref >90)
GFR calc non Af Amer: >90 mL/min (ref >90)
Glucose, Bld: 88 mg/dL (ref 70–99)
Potassium: 4 mmol/L (ref 3.5–5.1)
SODIUM: 139 mmol/L (ref 135–145)
Total Protein: 6.6 g/dL (ref 6.0–8.3)

## 2014-10-11 LAB — URINALYSIS, ROUTINE W REFLEX MICROSCOPIC
Bilirubin Urine: NEGATIVE
Glucose, UA: NEGATIVE mg/dL
HGB URINE DIPSTICK: NEGATIVE
Ketones, ur: NEGATIVE mg/dL
Leukocytes, UA: NEGATIVE
NITRITE: NEGATIVE
PH: 7.5 (ref 5.0–8.0)
Protein, ur: NEGATIVE mg/dL
SPECIFIC GRAVITY, URINE: 1.023 (ref 1.005–1.030)
Urobilinogen, UA: 1 mg/dL (ref 0.0–1.0)

## 2014-10-11 LAB — I-STAT TROPONIN, ED: TROPONIN I, POC: 0 ng/mL (ref 0.00–0.08)

## 2014-10-11 LAB — D-DIMER, QUANTITATIVE: D-Dimer, Quant: <0.27 ug/mL-FEU (ref 0.00–0.48)

## 2014-10-11 LAB — LIPASE, BLOOD: LIPASE: 27 U/L (ref 11–59)

## 2014-10-11 LAB — POC URINE PREG, ED: Preg Test, Ur: NEGATIVE

## 2014-10-11 MED ORDER — ACETAMINOPHEN 325 MG PO TABS
650.0000 mg | ORAL_TABLET | Freq: Once | ORAL | Status: AC
  Administered 2014-10-11: 650 mg via ORAL
  Filled 2014-10-11: qty 2

## 2014-10-11 MED ORDER — AZITHROMYCIN 250 MG PO TABS: ORAL_TABLET | ORAL | Status: DC

## 2014-10-11 MED ORDER — PROMETHAZINE-DM 6.25-15 MG/5ML PO SYRP: 5.0000 mL | ORAL_SOLUTION | Freq: Four times a day (QID) | ORAL | Status: DC | PRN

## 2014-10-11 MED ORDER — IPRATROPIUM-ALBUTEROL 0.5-2.5 (3) MG/3ML IN SOLN
3.0000 mL | Freq: Once | RESPIRATORY_TRACT | Status: AC
  Administered 2014-10-11: 3 mL via RESPIRATORY_TRACT
  Filled 2014-10-11: qty 3

## 2014-10-11 MED ORDER — ACETAMINOPHEN 500 MG PO TABS: 1000.0000 mg | ORAL_TABLET | Freq: Four times a day (QID) | ORAL | Status: DC | PRN

## 2014-10-11 NOTE — ED Notes (Addendum)
Pt c/o N/V/D x3 days. Had coughing spell last night with scant bright red blood. Also reports SOB when walking from hotel room to bus stop yesterday. C/o chest pain on right side radiating to the mid right back-dizziness with walking yesterday. Denies being around anyone sick. Has had sinus congestion and has had headache x2 days. Patient is homeless at this time and living in hotel room. Patient is ambulatory with steady gait. No other questions/concerns.

## 2014-10-11 NOTE — Discharge Instructions (Signed)
Acute Bronchitis °Bronchitis is inflammation of the airways that extend from the windpipe into the lungs (bronchi). The inflammation often causes mucus to develop. This leads to a cough, which is the most common symptom of bronchitis.  °In acute bronchitis, the condition usually develops suddenly and goes away over time, usually in a couple weeks. Smoking, allergies, and asthma can make bronchitis worse. Repeated episodes of bronchitis may cause further lung problems.  °CAUSES °Acute bronchitis is most often caused by the same virus that causes a cold. The virus can spread from person to person (contagious) through coughing, sneezing, and touching contaminated objects. °SIGNS AND SYMPTOMS  °· Cough.   °· Fever.   °· Coughing up mucus.   °· Body aches.   °· Chest congestion.   °· Chills.   °· Shortness of breath.   °· Sore throat.   °DIAGNOSIS  °Acute bronchitis is usually diagnosed through a physical exam. Your health care provider will also ask you questions about your medical history. Tests, such as chest X-rays, are sometimes done to rule out other conditions.  °TREATMENT  °Acute bronchitis usually goes away in a couple weeks. Oftentimes, no medical treatment is necessary. Medicines are sometimes given for relief of fever or cough. Antibiotic medicines are usually not needed but may be prescribed in certain situations. In some cases, an inhaler may be recommended to help reduce shortness of breath and control the cough. A cool mist vaporizer may also be used to help thin bronchial secretions and make it easier to clear the chest.  °HOME CARE INSTRUCTIONS °· Get plenty of rest.   °· Drink enough fluids to keep your urine clear or pale yellow (unless you have a medical condition that requires fluid restriction). Increasing fluids may help thin your respiratory secretions (sputum) and reduce chest congestion, and it will prevent dehydration.   °· Take medicines only as directed by your health care provider. °· If  you were prescribed an antibiotic medicine, finish it all even if you start to feel better. °· Avoid smoking and secondhand smoke. Exposure to cigarette smoke or irritating chemicals will make bronchitis worse. If you are a smoker, consider using nicotine gum or skin patches to help control withdrawal symptoms. Quitting smoking will help your lungs heal faster.   °· Reduce the chances of another bout of acute bronchitis by washing your hands frequently, avoiding people with cold symptoms, and trying not to touch your hands to your mouth, nose, or eyes.   °· Keep all follow-up visits as directed by your health care provider.   °SEEK MEDICAL CARE IF: °Your symptoms do not improve after 1 week of treatment.  °SEEK IMMEDIATE MEDICAL CARE IF: °· You develop an increased fever or chills.   °· You have chest pain.   °· You have severe shortness of breath. °· You have bloody sputum.   °· You develop dehydration. °· You faint or repeatedly feel like you are going to pass out. °· You develop repeated vomiting. °· You develop a severe headache. °MAKE SURE YOU:  °· Understand these instructions. °· Will watch your condition. °· Will get help right away if you are not doing well or get worse. °Document Released: 10/27/2004 Document Revised: 02/03/2014 Document Reviewed: 03/12/2013 °ExitCare® Patient Information ©2015 [employer]. This information is not intended to replace advice given to you by your health care provider. Make sure you discuss any questions you have with your health care provider. ° ° °Emergency Department Resource Guide °1) Find a Doctor and Pay Out of Pocket °Although you won't have to find out who is   covered by your insurance plan, it is a good idea to ask around and get recommendations. You will then need to call the office and see if the doctor you have chosen will accept you as a new patient and what types of options they offer for patients who are self-pay. Some doctors offer discounts or will set up  payment plans for their patients who do not have insurance, but you will need to ask so you aren't surprised when you get to your appointment. ° °2) Contact Your Local Health Department °Not all health departments have doctors that can see patients for sick visits, but many do, so it is worth a call to see if yours does. If you don't know where your local health department is, you can check in your phone book. The CDC also has a tool to help you locate your state's health department, and many state websites also have listings of all of their local health departments. ° °3) Find a Walk-in Clinic °If your illness is not likely to be very severe or complicated, you may want to try a walk in clinic. These are popping up all over the country in pharmacies, drugstores, and shopping centers. They're usually staffed by nurse practitioners or physician assistants that have been trained to treat common illnesses and complaints. They're usually fairly quick and inexpensive. However, if you have serious medical issues or chronic medical problems, these are probably not your best option. ° °No Primary Care Doctor: °- Call [facility] at  832-8000 - they can help you locate a primary care doctor that  accepts your insurance, provides certain services, etc. °- Physician Referral Service- 1-800-533-3463 ° °Chronic Pain Problems: °Organization         Address  Phone   Notes  °[facility]  (336) 297-2271 Patients need to be referred by their primary care doctor.  ° °Medication Assistance: °Organization         Address  Phone   Notes  °[place] [facility] [address] °[place], [place] [address] (336) 641-8030 --Must be a resident of [place] °-- Must have NO insurance coverage whatsoever (no Medicaid/ Medicare, etc.) °-- The pt. MUST have a primary care doctor that directs their care regularly and follows them in the community °  °[facility]  (866) 331-1348   °[employer]  (888) 892-1162   ° °Agencies that provide inexpensive medical care: °Organization         Address  Phone   Notes  °[facility]  (336) 832-8035   °[facility]    (336) 832-7272   °[facility] [address] °[place], [place] [address] (336) 832-4777   °[facility] [address], °[place] (336) 271-4999   °[facility]    (336) 373-0678   °[facility]    (336) 272-1050   °[facility] ° [address], [place] Phone:  (336) 832-4444, Fax:  (336) 832-4440 Hours of Operation:  9 am - 6 pm, M-F.  Also accepts Medicaid/Medicare and self-pay.  °[facility] ° [address],  Phone: (336) 832-3150, Fax: (336) 832-3151. Hours of Operation:  8:30 am - 5:30 pm, M-F.  Also accepts Medicaid and self-pay.  °[facility] [place] [address], [place] Phone: (336) 878-6027   °[facility] [address], [place], [place] (336)723-1848, Ext.   123 Mondays & Thursdays: 7-9 AM.  First 15 patients are seen on a first come, first serve basis. °  ° °Medicaid-accepting [place] Providers: ° °Organization         Address  Phone   Notes  °[facility] [address], [place] (336) 641-2100 Also accepts self-pay patients.  °[facility] [address], [place] ° (336) 856-9996   °[facility] [address], [place] (336) 288-8857   °[facility] [address] [place] [address], [place] (336) 299-7000   °Veita Bland [address], [place]  ° (336) 373-1557 Only accepts [place] Access Medicaid patients after they have their name applied to their card.  ° °Self-Pay (no insurance) in [place]: ° °Organization         Address  Phone   Notes  °Sickle Cell Patients, [facility] [address], [place]  (336) 832-1970   °[facility] [address], [place] (336) 832-4400   °[facility] ° [address], [place] (336) 992-4800   °[facility]/Dr. Osei-Bonsu ° [address] [place] [address], [place] or [address], [place] (336) 841-8500 Phone number for both [place] and [place] locations is the same.  °[facility] [address], [place] (336) 299-0000   °[facility] [address] [place] [address], [place] or [address] (336) 852-7530 °(336) 878-2260   °[facility] [address], [place] (336) 350-1642, phone; (336) 294-5005, fax Sees patients 1st and 3rd Saturday of every month.  Must not qualify for public or private insurance (i.e. Medicaid, Medicare, [place] Health Choice, Veterans' Benefits) • Household income should be no more than 200% of the poverty level •The clinic cannot treat you if you are pregnant or think you are pregnant • Sexually transmitted diseases are not treated at the clinic.  ° ° °Dental Care: °Organization         Address  Phone  Notes  °[facility] [address], [place] (336) 641-6152 Accepts children up to age 21 who are enrolled in Medicaid or [place] Health Choice; pregnant women with a Medicaid card; and children who have applied for Medicaid or [place] Health Choice, but were declined, whose parents can pay a reduced fee at time of service.  °[facility] [place]  [address], [place] (336) 641-7733 Accepts children up to age 21 who are enrolled in Medicaid or [place] Health Choice; pregnant women with a Medicaid card; and children who have applied for Medicaid or [place] Health Choice, but were declined, whose parents can pay a reduced fee at time of service.  °[facility] ° [address],  (336) 641-4533 Patients  are seen by appointment only. Walk-ins are not accepted. [facility] will see patients 18 years of age and older. °Monday - Tuesday (8am-5pm) °Most Wednesdays (8:30-5pm) °$30 per visit, cash only  °[facility] ° [address], [place] (336) 641-4533 Patients are seen by appointment only. Walk-ins are not accepted. [facility] will see patients 18 years of age and older. °One Wednesday Evening (Monthly: Volunteer Based).  $30 per visit, cash only  °[facility]  (919) [phone] for   adults; Children under age 4, call Graduate Pediatric Dentistry at ([phone]) [phone]. Children aged 4-14, please call (919) [phone] to request a pediatric application. ° Dental services are provided in all areas of dental care including fillings, crowns and bridges, complete and partial dentures, implants, gum treatment, root canals, and extractions. Preventive care is also provided. Treatment is provided to both adults and children. °Patients are selected via a lottery and there is often a waiting list. °  °[facility] [address], °[place] ° (336) 763-8833 [email or web address] °  °[facility] [address], [place], [place] (336)723-1848, Ext. 123 Second and Fourth Thursday of each month, opens at 6:30 AM; Clinic ends at 9 AM.  Patients are seen on a first-come first-served basis, and a limited number are seen during each clinic.  ° °[facility] ° [address], [place], [place] (336) 723-7904   Eligibility Requirements °You must have lived in [place], [place], or [place] counties for at least the last three months. °  You cannot be eligible for state or federal sponsored healthcare insurance, including Veterans Administration, Medicaid, or Medicare. °  You generally cannot be eligible for healthcare insurance through your employer.  °  How to apply: °Eligibility screenings are held every Tuesday and Wednesday afternoon from 1:00 pm until  4:00 pm. You do not need an appointment for the interview!  °[facility] [address], [place], [place] 336-631-2330   °[facility]  336-342-8273   °[facility]  336-703-3100   °[facility]  336-570-6415   ° °Behavioral Health Resources in the Community: °Intensive Outpatient Programs °Organization         Address  Phone  Notes  °[facility] [address], [place], [place] 336-878-6098   °[facility] [address], [place], [place] 336-832-9800   °[facility] [address], [place], [place] ° 336-882-2125   °[facility] [address],  °[place], [place] 1-800-853-5163 or 336-641-4981   °Substance Abuse Resources °Organization         Address  Phone  Notes  °[facility]  336-882-2125   °[facility]  336-784-9470   °[facility]  336-285-9073   °[facility]  336-845-3988   °Residential & Outpatient Substance Abuse Program  1-800-659-3381   °Psychological Services °Organization         Address  Phone  Notes  °[facility]  336- 832-9600   °[facility]  336- 378-7881   °[facility] [address], [place] 1-800-853-5163 or 336-641-4981   ° °Mobile Crisis Teams °Organization         Address  Phone  Notes  °[facility], Mobile Crisis Care Unit  1-877-626-1772   °Assertive °[facility] ° [address] [place], [place] 336-834-9664   °Sharon DeEsch [address] °[place] 336-554-5454   ° °Self-Help/Support Groups °Organization         Address  Phone             Notes  °[facility] - variety of support groups  336- 373-1402 Call for more information  °Narcotics Anonymous (NA), [facility] [address], °[place]  2 meetings at this location  ° °Residential Treatment Programs °Organization          Address  Phone  Notes  °[facility] [address],    °[place]  1-866-801-8205   °[facility] ° [address], [place], [place] 704-293-8524   °[facility] [address], [place] 336-845-3988 Admissions: 8am-3pm M-F  °[facility] [address]    °[place], [place] 336-841-1104   °[facility] [address], [place], [place] 336-379-7146   °[facility] [address]  °[place], [place] 336-285-9073   °[facility] - Intensive Outpatient [address], [place], [place] 336-852-3033   °[facility] [address]  °[place], [place] 1-877-615-2722 or 336-784-9470   °[facility] [address] [place], [place] 336-227-7417 Accepts Medicaid  °[facility] [address]  °[place] 1-800-659-3381 Substance Abuse/Addiction Treatment  ° °[place] Behavioral Health Resources °Organization         Address  Phone  Notes  °[facility]  (888) 581-9988   °Julie Brannon, PhD [address] [place], [place]   (336) 349-5553 or (336) 951-0000   °[facility]   [address] °[place], [place] (336) 349-4454   °[facility] [address], [place], [place] (336) 342-8316 Insurance/Medicaid/sponsorship through [facility]  °[facility] [address]                                    [place], [place] (336) 342-8316 Therapy/tele-psych/case  °[facility] [address]  ° [place], [place] (336) 349-2233    °Dr. Arfeen  (336) 349-4544   °[facility] [place]  [employer] [place] [facility] 1) [address], [place] °2) [address], [place] °3)  [address], [place] (336) 349-3220 °(336) 342-7768 ° °(336) 342-8140   °[place] [facility] (336) 342-1394 or (336) 342-3537 (After Hours)    ° ° ° °

## 2014-10-11 NOTE — ED Provider Notes (Signed)
CSN: [id number]     Arrival date & time 10/11/14  1113 History   First MD Initiated Contact with Patient 10/11/14 1155     Chief Complaint  Patient presents with  . Cough  . Nasal Congestion  . Sore Throat  . Nausea  . Emesis  . Diarrhea     (Consider location/radiation/quality/duration/timing/severity/associated sxs/prior Treatment) HPI The patient poor she's had about 3 days of cough and cold symptoms. She pushes also had nasal drainage and discharge. The cough and been nonproductive. She also notes she's been getting somewhat more short of breath with normal activities. Patient reports some associated right-sided chest pain with activity. She reports at rest the chest pain is not noticeable. She reports she's been getting hot cold episodes but has not had a thermometer to follow her temperature with. She is not having any lower extremity swelling. The patient reports that yesterday evening she had a coughing episode and some small amount of bright red blood came out. She reports she's never had that happen before. She reports she is usually very healthy. The patient does quite a bit of walking. She reports that she has to take the bus everywhere and so she walks a fair amount. Past Medical History  Diagnosis Date  . Hepatitis B antibody positive    Past Surgical History  Procedure Laterality Date  . Extraction of wisdom teeth     Family History  Problem Relation Age of Onset  . Adopted: Yes   History  Substance Use Topics  . Smoking status: Current Every Day Smoker -- 0.25 packs/day  . Smokeless tobacco: Not on file  . Alcohol Use: Yes     Comment: rare   OB History    No data available     Review of Systems 10 Systems reviewed and are negative for acute change except as noted in the HPI.   Allergies  Beef-derived products; Toradol; Augmentin; Naldecon senior; Ultram; Careers adviser; Benadryl; Sudafed; and Zyrtec  Home Medications   Prior to Admission medications    Medication Sig Start Date End Date Taking? Authorizing Provider  Acetaminophen-Caff-Pyrilamine (MIDOL COMPLETE) 500-60-15 MG TABS Take 2 tablets by mouth every 6 (six) hours as needed (for pain).   Yes Historical Provider, MD  etonogestrel (NEXPLANON) 68 MG IMPL implant Inject 1 each into the skin once. 03/2012   Yes Historical Provider, MD  ibuprofen (ADVIL,MOTRIN) 200 MG tablet Take 600 mg by mouth every 6 (six) hours as needed for headache, moderate pain or cramping.   Yes Historical Provider, MD  Probiotic Product (PROBIOTIC DAILY) CAPS Take 1 capsule by mouth daily.   Yes Historical Provider, MD  acetaminophen (TYLENOL) 500 MG tablet Take 2 tablets (1,000 mg total) by mouth every 6 (six) hours as needed. 10/11/14   Arby Barrette, MD  azithromycin (ZITHROMAX Z-PAK) 250 MG tablet Take 1 tablet (250 mg total) by mouth daily.  PO day 1, then  PO days 205 Patient not taking: Reported on 10/11/2014 04/17/14   Vida Roller, MD  azithromycin (ZITHROMAX Z-PAK) 250 MG tablet 2 po day one, then 1 daily x 4 days 10/11/14   Arby Barrette, MD  HYDROcodone-acetaminophen (NORCO/VICODIN) 5-325 MG per tablet Take 2 tablets by mouth every 4 (four) hours as needed. Patient not taking: Reported on 10/11/2014 04/17/14   Vida Roller, MD  promethazine-dextromethorphan (PROMETHAZINE-DM) 6.25-15 MG/5ML syrup Take 5 mLs by mouth 4 (four) times daily as needed for cough. 10/11/14   Arby Barrette, MD   BP 134/70  mmHg  Pulse 96  Temp(Src) 98.2 F (36.8 C) (Oral)  Resp 17  SpO2 100%  LMP 10/04/2014 Physical Exam  Constitutional: She is oriented to person, place, and time. She appears well-developed and well-nourished.  HENT:  Head: Normocephalic and atraumatic.  Eyes: EOM are normal. Pupils are equal, round, and reactive to light.  Neck: Neck supple.  Cardiovascular: Normal rate, regular rhythm, normal heart sounds and intact distal pulses.   Pulmonary/Chest: Effort normal and breath sounds normal.   Abdominal: Soft. Bowel sounds are normal. She exhibits no distension. There is no tenderness.  Musculoskeletal: Normal range of motion. She exhibits no edema.  Neurological: She is alert and oriented to person, place, and time. She has normal strength. Coordination normal. GCS eye subscore is 4. GCS verbal subscore is 5. GCS motor subscore is 6.  Skin: Skin is warm, dry and intact.  Psychiatric: She has a normal mood and affect.    ED Course  Procedures (including critical care time) Labs Review Labs Reviewed  URINALYSIS, ROUTINE W REFLEX MICROSCOPIC - Abnormal; Notable for the following:    APPearance CLOUDY (*)    All other components within normal limits  CBC WITH DIFFERENTIAL  COMPREHENSIVE METABOLIC PANEL  LIPASE, BLOOD  D-DIMER, QUANTITATIVE  POC URINE PREG, ED  Rosezena SensorI-STAT TROPOININ, ED    Imaging Review Dg Chest 2 View  10/11/2014   CLINICAL DATA:  Cough  EXAM: CHEST  2 VIEW  COMPARISON:  None.  FINDINGS: Lungs are clear.  No pleural effusion or pneumothorax.  Heart is normal in size.  Visualized osseous structures are within normal limits.  IMPRESSION: Normal chest radiographs.   Electronically Signed   By: Charline BillsSriyesh  Krishnan M.D.   On: 10/11/2014 13:35     EKG Interpretation   Date/Time:  Saturday October 11 2014 11:55:51 EST Ventricular Rate:  85 PR Interval:  124 QRS Duration: 84 QT Interval:  369 QTC Calculation: 439 R Axis:   81 Text Interpretation:  Sinus rhythm RSR' in V1 or V2, probably normal  variant agree. no change Confirmed by Donnald GarrePfeiffer, MD, Lebron ConnersMarcy [phone]([id number]) on  10/11/2014 1:40:54 PM      MDM   Final diagnoses:  Atypical pneumonia   The patient is well in general appearance. She is reporting chills and cough with other associated URI-type symptoms. Patient did report an episode of some blood-tinged sputum. At this point with a negative d-dimer and symptoms most consistent with infectious etiology, the patient will be treated for an atypical pneumonia  presentation. She is well in appearance with stable vital signs, no hypoxia and no comorbid medical history. She reports her self homeless however her general condition appears to be good. She reports she is staying in a hotel and she is well groomed and dressed. Her mental status is clear without any signs of acute psychiatric illness I feel the patient has appropriate capacity to seek follow-up care or return to the emergency department with any worsening or changing symptoms.    Arby BarretteMarcy Gennie Dib, MD 10/12/14 [phone]

## 2014-10-11 NOTE — ED Notes (Signed)
Pt states that she has had nasal congestion, sore throat, cough, vomiting and diarrhea x 1 wk.

## 2015-02-27 LAB — CYTOLOGY - PAP: PAP SMEAR: NEGATIVE

## 2015-03-31 ENCOUNTER — Telehealth (HOSPITAL_COMMUNITY): Payer: Self-pay | Admitting: *Deleted

## 2015-03-31 NOTE — Telephone Encounter (Signed)
Telephoned patient at home # and left message to return call to [facility] 

## 2015-04-03 ENCOUNTER — Telehealth (HOSPITAL_COMMUNITY): Payer: Self-pay | Admitting: *Deleted

## 2015-04-03 NOTE — Telephone Encounter (Signed)
Telephoned patient at home # and left message to return call to [facility] 

## 2015-04-21 ENCOUNTER — Telehealth (HOSPITAL_COMMUNITY): Payer: Self-pay | Admitting: *Deleted

## 2015-04-21 NOTE — Telephone Encounter (Signed)
Telephoned patient at home # and left message with female.

## 2015-09-17 ENCOUNTER — Ambulatory Visit (INDEPENDENT_AMBULATORY_CARE_PROVIDER_SITE_OTHER): Payer: Worker's Compensation | Admitting: Family Medicine

## 2015-09-17 VITALS — BP 130/78 | HR 68 | Temp 98.7°F | Resp 16 | Ht 64.0 in | Wt 139.4 lb

## 2015-09-17 DIAGNOSIS — S61412A Laceration without foreign body of left hand, initial encounter: Secondary | ICD-10-CM | POA: Diagnosis not present

## 2015-09-17 DIAGNOSIS — Z23 Encounter for immunization: Secondary | ICD-10-CM

## 2015-09-17 DIAGNOSIS — M79642 Pain in left hand: Secondary | ICD-10-CM

## 2015-09-17 NOTE — Patient Instructions (Signed)

## 2015-09-17 NOTE — Progress Notes (Signed)
By signing my name below, I, Rawaa Al Rifaie, attest thatElvina Orozco has been prepared under the direction and in the presence of Krishav Mamone, MD.  [name], Medical Scribe. 09/17/2015.  8:47 AM.     Patient ID: Christine Orozco MRN: [id number], DOB: [birth year], 29 y.o. Date of Encounter: 09/17/2015  Primary Physician: No PCP Per Patient  Chief Complaint:  Chief Complaint  Patient presents with  . left pointer finger    HPI:  Christine Orozco is a 29 y.o. female who presents to [facility] complaining of a laceration to the left pointer finger at work today. She indicate that the area was lacerated with a butcher knife. She reports that the bleeding in now controlled. Pt is not UTD with her tetanus vaccination.   Pt works at [employer].   Past Medical History  Diagnosis Date  . Hepatitis B antibody positive      Home Meds: Prior to Admission medications   Medication Sig Start Date End Date Taking? Authorizing Provider  medroxyPROGESTERone (DEPO-PROVERA) 150 MG/ML injection Inject 150 mg into the muscle every 3 (three) months.   Yes Historical Provider, MD    Allergies:  Allergies  Allergen Reactions  . Beef-Derived Products Nausea And Vomiting    Activity vomits     . Toradol [Ketorolac Tromethamine] Anaphylaxis  . Augmentin [Amoxicillin-Pot Clavulanate] Other (See Comments)    Childhood   . Naldecon Senior [Guaifenesin] Nausea And Vomiting    Hives   . Ultram [Tramadol] Other (See Comments)    Hallucinate and heart stop  . Allegra [Fexofenadine] Palpitations    hyperactive  . Benadryl [Diphenhydramine] Palpitations  . Sudafed [Pseudoephedrine] Palpitations    hyperactivity   . Zyrtec [Cetirizine] Palpitations    Hyperactive     Social History   Social History  . Marital Status: Divorced    Spouse Name: N/A  . Number of Children: N/A  . Years of Education: N/A   Occupational History  . Not on file.   Social  History Main Topics  . Smoking status: Current Every Day Smoker -- 0.25 packs/day  . Smokeless tobacco: Not on file  . Alcohol Use: Yes     Comment: rare  . Drug Use: Yes    Special: Marijuana     Comment: occ  . Sexual Activity: Yes    Birth Control/ Protection: Implant   Other Topics Concern  . Not on file   Social History Narrative     Review of Systems: Constitutional: negative for chills, fever, night sweats, weight changes, or fatigue  HEENT: negative for vision changes, hearing loss, congestion, rhinorrhea, ST, epistaxis, or sinus pressure Cardiovascular: negative for chest pain or palpitations Respiratory: negative for hemoptysis, wheezing, shortness of breath, or cough Abdominal: negative for abdominal pain, nausea, vomiting, diarrhea, or constipation Dermatological: negative for rash. Positive for laceration.  Neurologic: negative for headache, dizziness, or syncope All other systems reviewed and are otherwise negative with the exception to those above and in the HPI.  Physical Exam: Blood pressure 130/78, pulse 68, temperature 98.7 F (37.1 C), temperature source Oral, resp. rate 16, height  (1.626 m), weight 139 lb 6.4 oz (63.231 kg), SpO2 98 %., Body mass index is 23.92 kg/(m^2). General: Well developed, well nourished, in no acute distress. Head: Normocephalic, atraumatic, eyes without discharge, sclera non-icteric, nares are without discharge. Bilateral auditory canals clear, TM's are without perforation, pearly grey and translucent with reflective cone of light bilaterally. Oral  cavity moist, posterior pharynx without exudate, erythema, peritonsillar abscess, or post nasal drip.  Neck: Supple. No thyromegaly. Full ROM. No lymphadenopathy. Lungs: Clear bilaterally to auscultation without wheezes, rales, or rhonchi. Breathing is unlabored. Heart: RRR with S1 S2. No murmurs, rubs, or gallops appreciated. Msk:  Strength and tone normal for age. Extremities/Skin:  Warm and dry. No clubbing or cyanosis. No edema. No rashes. 1 cm laceration at the base of the left pointer finger.  Neuro: Alert and oriented X 3. Moves all extremities spontaneously. Gait is normal. CNII-XII grossly in tact. Psych:  Responds to questions appropriately with a normal affect.    ASSESSMENT AND PLAN:  29 y.o. year old female with  This chart was scribed in my presence and reviewed by me personally.    ICD-9-CM ICD-10-CM   1. Laceration of hand, left, initial encounter 882.0 S61.412A   2. Hand pain, left 729.5 M79.642 Tdap vaccine greater than or equal to 7yo IM    Wound instructions provided. Patient to return if all goes well in one week for suture removal. Patient to take the rest of the day off before returning tomorrow.   Signed, Christine SidleKurt Larhonda Dettloff, MD 09/17/2015 8:43 AM

## 2015-09-17 NOTE — Progress Notes (Signed)
Verbal consent obtained from patient.  Local anesthesia with 2cc Lidocaine 2% without epinephrine.  Wound scrubbed with soap and water and rinsed.  Wound closed with #3 4-0 Prolene simple interrupted sutures.  Wound cleansed and dressed.

## 2015-11-28 ENCOUNTER — Encounter (HOSPITAL_COMMUNITY): Payer: Self-pay | Admitting: Emergency Medicine

## 2015-11-28 ENCOUNTER — Emergency Department (HOSPITAL_COMMUNITY)
Admission: EM | Admit: 2015-11-28 | Discharge: 2015-11-28 | Disposition: A | Discharge status: Home / Self Care | Payer: Medicaid Other | Attending: Emergency Medicine | Admitting: Emergency Medicine

## 2015-11-28 DIAGNOSIS — F17200 Nicotine dependence, unspecified, uncomplicated: Secondary | ICD-10-CM | POA: Insufficient documentation

## 2015-11-28 DIAGNOSIS — H5712 Ocular pain, left eye: Secondary | ICD-10-CM | POA: Insufficient documentation

## 2015-11-28 MED ORDER — CIPROFLOXACIN HCL 0.3 % OP SOLN: 2.0000 [drp] | OPHTHALMIC | Status: DC

## 2015-11-28 MED ORDER — FLUORESCEIN SODIUM 1 MG OP STRP
1.0000 | ORAL_STRIP | Freq: Once | OPHTHALMIC | Status: AC
  Administered 2015-11-28: 1 via OPHTHALMIC
  Filled 2015-11-28: qty 1

## 2015-11-28 MED ORDER — TETRACAINE HCL 0.5 % OP SOLN
2.0000 [drp] | Freq: Once | OPHTHALMIC | Status: AC
  Administered 2015-11-28: 2 [drp] via OPHTHALMIC
  Filled 2015-11-28: qty 4

## 2015-11-28 NOTE — ED Provider Notes (Signed)
CSN: [id number]     Arrival date & time 11/28/15  4098 History   First MD Initiated Contact with Patient 11/28/15 [phone]     Chief Complaint  Patient presents with  . Eye Pain   (Consider location/radiation/quality/duration/timing/severity/associated sxs/prior Treatment) HPI  30 y.o. female presents to the Emergency Department today complaining of left eye pain since yesterday. Notes that when she woke up she noticed her left eye was red and throbbing. Does not endorse any injury to the area. Does wear contacts. Has happened in the past and notes using expired contact solution. States that the pain is a 7/10. No discharge/drainage. No itching. No loss of vision. Did not wear contacts today due to pain. No N/V/D. No CP/SOB/ABD pain. No other symptoms noted.    Past Medical History  Diagnosis Date  . Hepatitis B antibody positive    Past Surgical History  Procedure Laterality Date  . Extraction of wisdom teeth     Family History  Problem Relation Age of Onset  . Adopted: Yes   Social History  Substance Use Topics  . Smoking status: Current Every Day Smoker -- 0.25 packs/day  . Smokeless tobacco: None  . Alcohol Use: Yes     Comment: rare   OB History    No data available     Review of Systems ROS reviewed and all are negative for acute change except as noted in the HPI.  Allergies  Beef-derived products; Toradol; Augmentin; Naldecon senior; Ultram; Careers adviser; Benadryl; Sudafed; and Zyrtec  Home Medications   Prior to Admission medications   Medication Sig Start Date End Date Taking? Authorizing Provider  medroxyPROGESTERone (DEPO-PROVERA) 150 MG/ML injection Inject 150 mg into the muscle every 3 (three) months.    Historical Provider, MD   BP 120/72 mmHg  Pulse 82  Temp(Src) 97.6 F (36.4 C) (Oral)  Resp 16  SpO2 100%   Physical Exam  Constitutional: She is oriented to person, place, and time. She appears well-developed and well-nourished.  HENT:  Head: Normocephalic  and atraumatic.  Eyes: EOM are normal. Pupils are equal, round, and reactive to light. Right eye exhibits no discharge and no exudate. No foreign body present in the right eye. Left eye exhibits no discharge and no exudate. No foreign body present in the left eye. Right conjunctiva is not injected. Left conjunctiva is injected.  EOM intact. No pain with movement. Left conjunctiva injected. Pupils equal and reactive to light.   Cardiovascular: Normal rate, regular rhythm and normal heart sounds.   Pulmonary/Chest: Breath sounds normal.  Abdominal: Soft.  Musculoskeletal: Normal range of motion.  Neurological: She is alert and oriented to person, place, and time.  Skin: Skin is warm and dry.  Psychiatric: She has a normal mood and affect. Her behavior is normal. Thought content normal.  Nursing note and vitals reviewed. 8:42 AM Two drops of tetracaine/proparacaine instilled into affected eye.   Fluorescein strip applied to affected eye. Wood's lamp used to assess for corneal abrasion. No corneal abrasion identified. No foreign bodies noted. No visible hyphema.   Tonometry performed. Left eye pressure: 19  Patient tolerated procedure well without immediate complication.   ED Course  Procedures (including critical care time) Labs Review Labs Reviewed - No data to display  Imaging Review No results found. I have personally reviewed and evaluated these images and lab results as part of my medical decision-making.   EKG Interpretation None     MDM  I have reviewed the relevant previous healthcare  records. I obtained HPI from historian. Patient discussed with supervising physician  ED Course:  Assessment: 4y F presents with left eye pain since yesterday. No foreign bodies noted. No surrounding erythema, swelling, vision changes/loss suspicious for orbital or periorbital cellulitis. No signs of iritis. No signs of glaucoma, intraocular pressures normal. No symptoms of retinal  detachment. No ophthalmologic emergency suspected. Outpatient referral given in case of no improvement. Given Rx Erythromycin. At time of discharge, Patient is in no acute distress. Vital Signs are stable. Patient is able to ambulate. Patient able to tolerate PO.    Disposition/Plan:  DC Home Additional Verbal discharge instructions given and discussed with patient.  Pt Instructed to f/u with Opthalmology  Return precautions given Pt acknowledges and agrees with plan  Supervising Physician Donnetta Hutching, MD   Final diagnoses:  Eye pain, left      Audry Pili, PA-C 11/28/15 0932  Donnetta Hutching, MD 11/29/15 [phone]

## 2015-11-28 NOTE — Discharge Instructions (Signed)
Please read and follow all provided instructions.  Your diagnoses today include:  1. Eye pain, left    Tests performed today include:  Vital signs. See below for your results today.   Medications prescribed:   Take an medications as prescribed.   Home care instructions:  Follow any educational materials contained in this packet.  Follow-up instructions: Please follow-up with Opthalmology. Call and make an appointment if symptoms do not improve.   Return instructions:   Please return to the Emergency Department if you do not get better, if you get worse, or new symptoms OR  - Fever (temperature greater than 101.57F)  - Bleeding that does not stop with holding pressure to the area    -Severe pain (please note that you may be more sore the day after your accident)  - Chest Pain  - Difficulty breathing  - Severe nausea or vomiting  - Inability to tolerate food and liquids  - Passing out  - Skin becoming red around your wounds  - Change in mental status (confusion or lethargy)  - New numbness or weakness     Please return if you have any other emergent concerns.  Additional Information:  Your vital signs today were: BP 120/72 mmHg   Pulse 82   Temp(Src) 97.6 F (36.4 C) (Oral)   Resp 16   SpO2 100% If your blood pressure (BP) was elevated above 135/85 this visit, please have this repeated by your doctor within one month. ---------------

## 2015-11-28 NOTE — ED Notes (Signed)
Pt does not have contact in L eye. Could not read chart with that eye but could count fingers.

## 2015-11-28 NOTE — ED Notes (Signed)
Pt reports L eye pain and redness since yesterday. No injuries that she can recall. Pt has had eye intermittent eye irritation since 2006 when she used bad contact lens solution. Pt also having watery drainage.

## 2015-12-02 ENCOUNTER — Emergency Department (HOSPITAL_COMMUNITY)
Admission: EM | Admit: 2015-12-02 | Discharge: 2015-12-02 | Disposition: A | Discharge status: Home / Self Care | Payer: Medicaid Other | Attending: Emergency Medicine | Admitting: Emergency Medicine

## 2015-12-02 ENCOUNTER — Encounter (HOSPITAL_COMMUNITY): Payer: Self-pay

## 2015-12-02 DIAGNOSIS — Z3202 Encounter for pregnancy test, result negative: Secondary | ICD-10-CM | POA: Diagnosis not present

## 2015-12-02 DIAGNOSIS — R112 Nausea with vomiting, unspecified: Secondary | ICD-10-CM | POA: Diagnosis not present

## 2015-12-02 DIAGNOSIS — R1110 Vomiting, unspecified: Secondary | ICD-10-CM | POA: Diagnosis present

## 2015-12-02 DIAGNOSIS — R51 Headache: Secondary | ICD-10-CM | POA: Insufficient documentation

## 2015-12-02 DIAGNOSIS — Z87891 Personal history of nicotine dependence: Secondary | ICD-10-CM | POA: Diagnosis not present

## 2015-12-02 LAB — URINALYSIS, ROUTINE W REFLEX MICROSCOPIC
GLUCOSE, UA: NEGATIVE mg/dL
Hgb urine dipstick: NEGATIVE
KETONES UR: NEGATIVE mg/dL
Nitrite: NEGATIVE
Protein, ur: NEGATIVE mg/dL
Specific Gravity, Urine: 1.031 — ABNORMAL HIGH (ref 1.005–1.030)
pH: 5.5 (ref 5.0–8.0)

## 2015-12-02 LAB — PREGNANCY, URINE: Preg Test, Ur: NEGATIVE

## 2015-12-02 LAB — URINE MICROSCOPIC-ADD ON
Bacteria, UA: NONE SEEN
RBC / HPF: NONE SEEN RBC/hpf (ref 0–5)

## 2015-12-02 MED ORDER — ONDANSETRON 8 MG PO TBDP: 8.0000 mg | ORAL_TABLET | Freq: Once | ORAL | Status: DC

## 2015-12-02 MED ORDER — ONDANSETRON HCL 4 MG/2ML IJ SOLN
4.0000 mg | Freq: Once | INTRAMUSCULAR | Status: DC | PRN
  Administered 2015-12-02: 4 mg via INTRAVENOUS
  Filled 2015-12-02: qty 2

## 2015-12-02 MED ORDER — ONDANSETRON HCL 8 MG PO TABS: 8.0000 mg | ORAL_TABLET | Freq: Three times a day (TID) | ORAL | Status: DC | PRN

## 2015-12-02 NOTE — Discharge Instructions (Signed)
Nausea and Vomiting °Nausea is a sick feeling that often comes before throwing up (vomiting). Vomiting is a reflex where stomach contents come out of your mouth. Vomiting can cause severe loss of body fluids (dehydration). Children and elderly adults can become dehydrated quickly, especially if they also have diarrhea. Nausea and vomiting are symptoms of a condition or disease. It is important to find the cause of your symptoms. °CAUSES  °· Direct irritation of the stomach lining. This irritation can result from increased acid production (gastroesophageal reflux disease), infection, food poisoning, taking certain medicines (such as nonsteroidal anti-inflammatory drugs), alcohol use, or tobacco use. °· Signals from the brain. These signals could be caused by a headache, heat exposure, an inner ear disturbance, increased pressure in the brain from injury, infection, a tumor, or a concussion, pain, emotional stimulus, or metabolic problems. °· An obstruction in the gastrointestinal tract (bowel obstruction). °· Illnesses such as diabetes, hepatitis, gallbladder problems, appendicitis, kidney problems, cancer, sepsis, atypical symptoms of a heart attack, or eating disorders. °· Medical treatments such as chemotherapy and radiation. °· Receiving medicine that makes you sleep (general anesthetic) during surgery. °DIAGNOSIS °Your caregiver may ask for tests to be done if the problems do not improve after a few days. Tests may also be done if symptoms are severe or if the reason for the nausea and vomiting is not clear. Tests may include: °· Urine tests. °· Blood tests. °· Stool tests. °· Cultures (to look for evidence of infection). °· X-rays or other imaging studies. °Test results can help your caregiver make decisions about treatment or the need for additional tests. °TREATMENT °You need to stay well hydrated. Drink frequently but in small amounts. You may wish to drink water, sports drinks, clear broth, or eat frozen  ice pops or gelatin dessert to help stay hydrated. When you eat, eating slowly may help prevent nausea. There are also some antinausea medicines that may help prevent nausea. °HOME CARE INSTRUCTIONS  °· Take all medicine as directed by your caregiver. °· If you do not have an appetite, do not force yourself to eat. However, you must continue to drink fluids. °· If you have an appetite, eat a normal diet unless your caregiver tells you differently. °¨ Eat a variety of complex carbohydrates (rice, wheat, potatoes, bread), lean meats, yogurt, fruits, and vegetables. °¨ Avoid high-fat foods because they are more difficult to digest. °· Drink enough water and fluids to keep your urine clear or pale yellow. °· If you are dehydrated, ask your caregiver for specific rehydration instructions. Signs of dehydration may include: °¨ Severe thirst. °¨ Dry lips and mouth. °¨ Dizziness. °¨ Dark urine. °¨ Decreasing urine frequency and amount. °¨ Confusion. °¨ Rapid breathing or pulse. °SEEK IMMEDIATE MEDICAL CARE IF:  °· You have blood or brown flecks (like coffee grounds) in your vomit. °· You have black or bloody stools. °· You have a severe headache or stiff neck. °· You are confused. °· You have severe abdominal pain. °· You have chest pain or trouble breathing. °· You do not urinate at least once every 8 hours. °· You develop cold or clammy skin. °· You continue to vomit for longer than 24 to 48 hours. °· You have a fever. °MAKE SURE YOU:  °· Understand these instructions. °· Will watch your condition. °· Will get help right away if you are not doing well or get worse. °  °This information is not intended to replace advice given to you by your health care provider. Make sure   you discuss any questions you have with your health care provider.   Document Released: 09/19/2005 Document Revised: 12/12/2011 Document Reviewed: 02/16/2011 Elsevier Interactive Patient Education 2016 [employer].   [employer] Guide -  Urgent [facility] Centers    Other Local Resources (Updated 10/2015)  Urgent Care Centers   Hours of Operation and Other Information    Address and Phone Number  [place] [facility]  Hours: Monday - Friday, 7 am - 8 pm, Saturday and Sunday, 8 am - 4 pm  Accepts Medicare, Medicaid, and private insurance  Offers sliding scale for uninsured [phone] [address] [place], [place] [address]   [facility], [facility]    Hours: Monday - Friday, 8 am - 8 pm, Saturday 9 am - 6 pm, and Sunday 11 am - 6 pm  Accepts Medicare, Medicaid, and private insurance  Offers sliding scale for uninsured [phone] [address] [place], [place] [address]  [facility]   Hours: Monday - Sunday, 1 pm - 8 pm  Accepts Medicare, Medicaid, and private insurance  Offers sliding scale for uninsured [phone] [address] [place], [place] [address]   [facility]   Hours: Monday - Thursday, 8 am - 8:30 pm, Friday 8 am - 6 pm, Saturday and Sunday 8 am - 4 pm  Accepts Medicare, Medicaid, and private insurance  Offers sliding scale for uninsured [phone] [address], [place], [place]

## 2015-12-02 NOTE — ED Notes (Signed)
Pt c/o intermittent emesis and headache x 1 day.  Pain score 6/10.  Pt has not taken anything for symptoms.  Denies abdominal pain and diarrhea.  Sts "it has a mucusy taste to it, so it may just be sinuses."  Pt reports "I just wanted to go home a lay down, but my job told me that I needed to go to the hospital for a note."

## 2015-12-02 NOTE — ED Provider Notes (Signed)
CSN: [id number]     Arrival date & time 12/02/15  0744 History   First MD Initiated Contact with Patient 12/02/15 [phone]     Chief Complaint  Patient presents with  . Emesis  . Headache     (Consider location/radiation/quality/duration/timing/severity/associated sxs/prior Treatment) Patient is a 30 y.o. female presenting with vomiting and headaches. The history is provided by the patient.  Emesis Associated symptoms: headaches   Headache Associated symptoms: vomiting    Christine Orozco is a 30 y.o. female here for evaluation of headache and vomiting which started yesterday. No fever, chills, diarrhea, weakness, dizziness. She went to work this morning, but then worsened with recurrent vomiting, so decided to come here for evaluation. She cannot recall when her last menstrual cycle was. She thinks she could be pregnant. She denies abdominal pain. There are no other known modifying factors.    Past Medical History  Diagnosis Date  . Hepatitis B antibody positive    Past Surgical History  Procedure Laterality Date  . Extraction of wisdom teeth     Family History  Problem Relation Age of Onset  . Adopted: Yes   Social History  Substance Use Topics  . Smoking status: Former Smoker -- 0.25 packs/day  . Smokeless tobacco: None  . Alcohol Use: Yes     Comment: rare   OB History    No data available     Review of Systems  Gastrointestinal: Positive for vomiting.  Neurological: Positive for headaches.  All other systems reviewed and are negative.     Allergies  Beef-derived products; Toradol; Augmentin; Naldecon senior; Ultram; Careers adviser; Benadryl; Sudafed; and Zyrtec  Home Medications   Prior to Admission medications   Medication Sig Start Date End Date Taking? Authorizing Provider  ciprofloxacin (CILOXAN) 0.3 % ophthalmic solution Place 2 drops into the left eye every 4 (four) hours while awake. Administer 1 drop, every 2 hours, while awake, for 2 days. Then 1 drop,  every 4 hours, while awake, for the next 5 days. 11/28/15  Yes Audry Pili, PA-C  DOXYLAMINE SUCCINATE PO Take 1 tablet by mouth at bedtime as needed (sleep).   Yes Historical Provider, MD  ibuprofen (ADVIL,MOTRIN) 200 MG tablet Take 400-800 mg by mouth every 6 (six) hours as needed for headache or moderate pain.   Yes Historical Provider, MD  ondansetron (ZOFRAN) 8 MG tablet Take 1 tablet (8 mg total) by mouth every 8 (eight) hours as needed for nausea or vomiting. 12/02/15   Mancel Bale, MD   BP 121/84 mmHg  Pulse 92  Temp(Src) 98 F (36.7 C) (Oral)  Resp 16  SpO2 99% Physical Exam  Constitutional: She is oriented to person, place, and time. She appears well-developed and well-nourished. No distress (she is nontoxic).  HENT:  Head: Normocephalic and atraumatic.  Right Ear: External ear normal.  Left Ear: External ear normal.  Eyes: Conjunctivae and EOM are normal. Pupils are equal, round, and reactive to light.  Neck: Normal range of motion and phonation normal. Neck supple.  Cardiovascular: Normal rate, regular rhythm and normal heart sounds.   Pulmonary/Chest: Effort normal and breath sounds normal. She exhibits no bony tenderness.  Abdominal: Soft. There is no tenderness. There is no guarding.  Musculoskeletal: Normal range of motion.  Neurological: She is alert and oriented to person, place, and time. No cranial nerve deficit or sensory deficit. She exhibits normal muscle tone. Coordination normal.  Skin: Skin is warm, dry and intact.  Psychiatric: She has a normal  mood and affect. Her behavior is normal. Judgment and thought content normal.  Nursing note and vitals reviewed.   ED Course  Procedures (including critical care time)  Medications  ondansetron (ZOFRAN-ODT) disintegrating tablet 8 mg (0 mg Oral Hold 12/02/15 0840)    Patient Vitals for the past 24 hrs:  BP Temp Temp src Pulse Resp SpO2  12/02/15 0757 121/84 mmHg 98 F (36.7 C) Oral 92 16 99 %    10:26 AM  Reevaluation with update and discussion. After initial assessment and treatment, an updated evaluation reveals tolerating oral liquids at this time. Nausea is controlled. Findings discussed with patient and all questions were answered. [name]    Labs Review Labs Reviewed  URINALYSIS, ROUTINE W REFLEX MICROSCOPIC (NOT AT [facility]) - Abnormal; Notable for the following:    Color, Urine AMBER (*)    APPearance CLOUDY (*)    Specific Gravity, Urine 1.031 (*)    Bilirubin Urine SMALL (*)    Leukocytes, UA TRACE (*)    All other components within normal limits  URINE MICROSCOPIC-ADD ON - Abnormal; Notable for the following:    Squamous Epithelial / LPF 6-30 (*)    All other components within normal limits  PREGNANCY, URINE    Imaging Review No results found. I have personally reviewed and evaluated these images and lab results as part of my medical decision-making.   EKG Interpretation None      MDM   Final diagnoses:  Nausea and vomiting, vomiting of unspecified type    Nonspecific, nausea and vomiting. Doubt serious bacterial infection. Metabolic instability or impending vascular collapse.  Nursing Notes Reviewed/ Care Coordinated Applicable Imaging Reviewed Interpretation of Laboratory Data incorporated into ED treatment  The patient appears reasonably screened and/or stabilized for discharge and I doubt any other medical condition or other [facility] requiring further screening, evaluation, or treatment in the ED at this time prior to discharge.  Plan: Home Medications- Zofran; Home Treatments- gradually advanced diet; return here if the recommended treatment, does not improve the symptoms; Recommended follow up- PCP, when necessary     Mancel Bale, MD 12/02/15 1027

## 2016-01-22 ENCOUNTER — Emergency Department (HOSPITAL_COMMUNITY)
Admission: EM | Admit: 2016-01-22 | Discharge: 2016-01-22 | Disposition: A | Discharge status: Home / Self Care | Payer: Medicaid Other | Attending: Emergency Medicine | Admitting: Emergency Medicine

## 2016-01-22 ENCOUNTER — Encounter (HOSPITAL_COMMUNITY): Payer: Self-pay

## 2016-01-22 ENCOUNTER — Emergency Department (HOSPITAL_COMMUNITY): Payer: Medicaid Other

## 2016-01-22 DIAGNOSIS — R1031 Right lower quadrant pain: Secondary | ICD-10-CM

## 2016-01-22 DIAGNOSIS — N200 Calculus of kidney: Secondary | ICD-10-CM | POA: Diagnosis not present

## 2016-01-22 DIAGNOSIS — Z79899 Other long term (current) drug therapy: Secondary | ICD-10-CM | POA: Diagnosis not present

## 2016-01-22 DIAGNOSIS — R112 Nausea with vomiting, unspecified: Secondary | ICD-10-CM | POA: Diagnosis present

## 2016-01-22 DIAGNOSIS — F17200 Nicotine dependence, unspecified, uncomplicated: Secondary | ICD-10-CM | POA: Insufficient documentation

## 2016-01-22 DIAGNOSIS — R109 Unspecified abdominal pain: Secondary | ICD-10-CM

## 2016-01-22 LAB — CBC
HEMATOCRIT: 42.5 % (ref 36.0–46.0)
HEMOGLOBIN: 14 g/dL (ref 12.0–15.0)
MCH: 29.7 pg (ref 26.0–34.0)
MCHC: 32.9 g/dL (ref 30.0–36.0)
MCV: 90.2 fL (ref 78.0–100.0)
Platelets: 252 10*3/uL (ref 150–400)
RBC: 4.71 MIL/uL (ref 3.87–5.11)
RDW: 13.5 % (ref 11.5–15.5)
WBC: 10.4 10*3/uL (ref 4.0–10.5)

## 2016-01-22 LAB — COMPREHENSIVE METABOLIC PANEL
ALBUMIN: 4.9 g/dL (ref 3.5–5.0)
ALT: 23 U/L (ref 14–54)
ANION GAP: 10 (ref 5–15)
AST: 23 U/L (ref 15–41)
Alkaline Phosphatase: 64 U/L (ref 38–126)
BUN: 11 mg/dL (ref 6–20)
CO2: 23 mmol/L (ref 22–32)
Calcium: 9.4 mg/dL (ref 8.9–10.3)
Chloride: 105 mmol/L (ref 101–111)
Creatinine, Ser: 0.89 mg/dL (ref 0.44–1.00)
GFR calc Af Amer: >60 mL/min (ref >60)
GFR calc non Af Amer: >60 mL/min (ref >60)
GLUCOSE: 102 mg/dL — AB (ref 65–99)
POTASSIUM: 3.8 mmol/L (ref 3.5–5.1)
SODIUM: 138 mmol/L (ref 135–145)
Total Bilirubin: 0.9 mg/dL (ref 0.3–1.2)
Total Protein: 7.5 g/dL (ref 6.5–8.1)

## 2016-01-22 LAB — I-STAT BETA HCG BLOOD, ED (MC, WL, AP ONLY): I-stat hCG, quantitative: <5 m[IU]/mL (ref <5)

## 2016-01-22 LAB — URINALYSIS, ROUTINE W REFLEX MICROSCOPIC
Glucose, UA: NEGATIVE mg/dL
Ketones, ur: 15 mg/dL — AB
Leukocytes, UA: NEGATIVE
NITRITE: NEGATIVE
PH: 5 (ref 5.0–8.0)
Protein, ur: NEGATIVE mg/dL
Specific Gravity, Urine: 1.029 (ref 1.005–1.030)

## 2016-01-22 LAB — URINE MICROSCOPIC-ADD ON

## 2016-01-22 LAB — LIPASE, BLOOD: Lipase: 13 U/L (ref 11–51)

## 2016-01-22 MED ORDER — IBUPROFEN 600 MG PO TABS: 600.0000 mg | ORAL_TABLET | Freq: Four times a day (QID) | ORAL | Status: DC | PRN

## 2016-01-22 MED ORDER — ACETAMINOPHEN 500 MG PO TABS: 1000.0000 mg | ORAL_TABLET | Freq: Three times a day (TID) | ORAL | Status: DC | PRN

## 2016-01-22 MED ORDER — MORPHINE SULFATE (PF) 4 MG/ML IV SOLN
4.0000 mg | Freq: Once | INTRAVENOUS | Status: AC
  Administered 2016-01-22: 4 mg via INTRAVENOUS
  Filled 2016-01-22: qty 1

## 2016-01-22 MED ORDER — SODIUM CHLORIDE 0.9 % IV BOLUS (SEPSIS)
1000.0000 mL | Freq: Once | INTRAVENOUS | Status: AC
Start: 2016-01-22 — End: 2016-01-22
  Administered 2016-01-22: 1000 mL via INTRAVENOUS

## 2016-01-22 MED ORDER — ONDANSETRON HCL 4 MG/2ML IJ SOLN
4.0000 mg | Freq: Once | INTRAMUSCULAR | Status: AC
  Administered 2016-01-22: 4 mg via INTRAVENOUS
  Filled 2016-01-22: qty 2

## 2016-01-22 NOTE — Discharge Instructions (Signed)
You do have a kidney stone on the right side that has not dropped or become stuck. Your CT scan does not show any serious cause of your symptoms. Continue to take tylenol and motrin as needed for pain. Return for worsening symptoms, including fever, vomiting and unable to keep down food/fluids, or any other symptoms concerning to you.  Flank Pain Flank pain refers to pain that is located on the side of the body between the upper abdomen and the back. The pain may occur over a short period of time (acute) or may be long-term or reoccurring (chronic). It may be mild or severe. Flank pain can be caused by many things. CAUSES  Some of the more common causes of flank pain include:  Muscle strains.   Muscle spasms.   A disease of your spine (vertebral disk disease).   A lung infection (pneumonia).   Fluid around your lungs (pulmonary edema).   A kidney infection.   Kidney stones.   A very painful skin rash caused by the chickenpox virus (shingles).   Gallbladder disease.  HOME CARE INSTRUCTIONS  Home care will depend on the cause of your pain. In general,  Rest as directed by your caregiver.  Drink enough fluids to keep your urine clear or pale yellow.  Only take over-the-counter or prescription medicines as directed by your caregiver. Some medicines may help relieve the pain.  Tell your caregiver about any changes in your pain.  Follow up with your caregiver as directed. SEEK IMMEDIATE MEDICAL CARE IF:   Your pain is not controlled with medicine.   You have new or worsening symptoms.  Your pain increases.   You have abdominal pain.   You have shortness of breath.   You have persistent nausea or vomiting.   You have swelling in your abdomen.   You feel faint or pass out.   You have blood in your urine.  You have a fever or persistent symptoms for more than 2-3 days.  You have a fever and your symptoms suddenly get worse. MAKE SURE YOU:    Understand these instructions.  Will watch your condition.  Will get help right away if you are not doing well or get worse.   This information is not intended to replace advice given to you by your health care provider. Make sure you discuss any questions you have with your health care provider.   Document Released: 11/10/2005 Document Revised: 06/13/2012 Document Reviewed: 05/03/2012 Elsevier Interactive Patient Education [employer]2016 Elsevier Inc.

## 2016-01-22 NOTE — ED Provider Notes (Signed)
CSN: [id number]     Arrival date & time 01/22/16  1217 History   First MD Initiated Contact with Patient 01/22/16 1444     Chief Complaint  Patient presents with  . Abdominal Pain  . Emesis  . Flank Pain     (Consider location/radiation/quality/duration/timing/severity/associated sxs/prior Treatment) HPI 30 year old female who presents with right flank pain and RLQ abdominal pain. History of kidney stones. States several days of right flank pain radiating into the right lower abdomin and right pelvis. Associated with some nausea and vomiting, but no diarrhea or constipation. No vaginal bleeding or discharge. No dysuria, urinary frequency, or hematuria. Lifting heavy boxes at work, and states pain may also be worse with movement. No prior abdominal surgeries.  Past Medical History  Diagnosis Date  . Hepatitis B antibody positive    Past Surgical History  Procedure Laterality Date  . Extraction of wisdom teeth     Family History  Problem Relation Age of Onset  . Adopted: Yes   Social History  Substance Use Topics  . Smoking status: Former Smoker -- 0.25 packs/day  . Smokeless tobacco: None  . Alcohol Use: Yes     Comment: rare   OB History    No data available     Review of Systems    Allergies  Beef-derived products; Toradol; Augmentin; Naldecon senior; Ultram; Careers adviser; Benadryl; Sudafed; and Zyrtec  Home Medications   Prior to Admission medications   Medication Sig Start Date End Date Taking? Authorizing Provider  ibuprofen (ADVIL,MOTRIN) 200 MG tablet Take 400-800 mg by mouth every 6 (six) hours as needed for headache or moderate pain.   Yes Historical Provider, MD  acetaminophen (TYLENOL) 500 MG tablet Take 2 tablets (1,000 mg total) by mouth every 8 (eight) hours as needed for mild pain or moderate pain. 01/22/16   Lavera Guise, MD  ciprofloxacin (CILOXAN) 0.3 % ophthalmic solution Place 2 drops into the left eye every 4 (four) hours while awake. Administer 1  drop, every 2 hours, while awake, for 2 days. Then 1 drop, every 4 hours, while awake, for the next 5 days. 11/28/15   Audry Pili, PA-C  ibuprofen (ADVIL,MOTRIN) 600 MG tablet Take 1 tablet (600 mg total) by mouth every 6 (six) hours as needed for mild pain or moderate pain. 01/22/16   Lavera Guise, MD  ondansetron (ZOFRAN) 8 MG tablet Take 1 tablet (8 mg total) by mouth every 8 (eight) hours as needed for nausea or vomiting. 12/02/15   Mancel Bale, MD   BP 130/86 mmHg  Pulse 93  Temp(Src) 98.3 F (36.8 C) (Oral)  Resp 16  SpO2 97%  LMP  Physical Exam Physical Exam  Nursing note and vitals reviewed. Constitutional: Well developed, well nourished, non-toxic, and in no acute distress Head: Normocephalic and atraumatic.  Mouth/Throat: Oropharynx is clear and moist.  Neck: Normal range of motion. Neck supple.  Cardiovascular: Normal rate and regular rhythm.   Pulmonary/Chest: Effort normal and breath sounds normal.  Abdominal: Soft. There is RLQ and right adnexal tenderness. There is no rebound and no guarding. Minimal right CVA tenderness. Musculoskeletal: Normal range of motion.  Neurological: Alert, no facial droop, fluent speech, moves all extremities symmetrically Skin: Skin is warm and dry.  Psychiatric: Cooperative  ED Course  Procedures (including critical care time) Labs Review Labs Reviewed  COMPREHENSIVE METABOLIC PANEL - Abnormal; Notable for the following:    Glucose, Bld 102 (*)    All other components within normal  limits  URINALYSIS, ROUTINE W REFLEX MICROSCOPIC (NOT AT [facility]) - Abnormal; Notable for the following:    APPearance CLOUDY (*)    Hgb urine dipstick TRACE (*)    Bilirubin Urine SMALL (*)    Ketones, ur 15 (*)    All other components within normal limits  URINE MICROSCOPIC-ADD ON - Abnormal; Notable for the following:    Squamous Epithelial / LPF 6-30 (*)    Bacteria, UA FEW (*)    All other components within normal limits  LIPASE, BLOOD  CBC  I-STAT  BETA HCG BLOOD, ED ([facility], [facility], [facility] ONLY)    Imaging Review Ct Renal Stone Study  01/22/2016  CLINICAL DATA:  Right lower quadrant pain and right flank pain. Vomiting. EXAM: CT ABDOMEN AND PELVIS WITHOUT CONTRAST TECHNIQUE: Multidetector CT imaging of the abdomen and pelvis was performed following the standard protocol without IV contrast. COMPARISON:  None. FINDINGS: Lower chest:  Normal. Hepatobiliary: Normal. Pancreas: Normal. Spleen: Normal. Adrenals/Urinary Tract: 2 mm stone in the lower pole of the right kidney. Adrenal glands and left kidney are normal. No hydronephrosis. Ureters and bladder appear normal. Stomach/Bowel: Normal including the terminal ileum and appendix. Vascular/Lymphatic: Normal. Reproductive: 2 cm low-density area in the left ovary, most likely a dominant follicle. Right ovary and uterus appear normal. Other: No free air or free fluid. Musculoskeletal: Normal. IMPRESSION: No acute abnormalities of the abdomen or pelvis. 2 mm stone in the lower pole of the right kidney. Probable dominant follicle on the left ovary. Electronically Signed   By: Francene BoyersJames  Maxwell M.D.   On: 01/22/2016 16:23   I have personally reviewed and evaluated these images and lab results as part of my medical decision-making.   EKG Interpretation None      MDM   Final diagnoses:  Right flank pain  Right lower quadrant abdominal pain  Kidney stone   30 year old female who presents with RLQ and right flank pain. AF and HD stable on arrival. Soft and nonsurgical abdomen. With RLQ and right adnexal tenderness. Mild CVA tenderness on right. UA without infection or and minimal blood. Unremarkable CBC, CMP and negative pregnancy test. CT abd/pelvis perform. No acute pelvic processes and normal appendix. Non-obstructing right renal stone but no ureteral stone to explain pain. Question MSK pain given heavy lifting at work.   Recommending OTC tylenol and motrin for pain control. Requesting stronger medication and  becomes very upset when I reviewed that she does seem to have anything on her work-up that would require stronger medication. Then states that she is taking tylenol #3 and not helping and needs stronger narcotic medication. Discussed that she should continue taking what she is taking and continue supportive care. Then states she finished tylenol #3, and needs refill at least, but this is declined. I do not suspect serious or toxic etiology of her symptoms currently. Feel that she is appropriate for discharge and outpatient supportive care.Strict return and follow-up instructions reviewed. She expressed understanding of all discharge instructions and felt comfortable with the plan of care.    Lavera Guiseana Duo Patsye Sullivant, MD 01/22/16 [phone]

## 2016-01-22 NOTE — ED Notes (Signed)
Pt c/o RLQ abdominal pain. R flank pain and emesis starting this afternoon.  Pain score 8/10.  Pt reports discolored urine.  Denies dysuria.

## 2016-02-23 ENCOUNTER — Encounter (HOSPITAL_COMMUNITY): Payer: Self-pay

## 2016-02-23 ENCOUNTER — Emergency Department (HOSPITAL_COMMUNITY)
Admission: EM | Admit: 2016-02-23 | Discharge: 2016-02-23 | Disposition: A | Discharge status: Home / Self Care | Payer: Medicaid Other | Attending: Emergency Medicine | Admitting: Emergency Medicine

## 2016-02-23 DIAGNOSIS — X58XXXA Exposure to other specified factors, initial encounter: Secondary | ICD-10-CM | POA: Diagnosis not present

## 2016-02-23 DIAGNOSIS — S46911A Strain of unspecified muscle, fascia and tendon at shoulder and upper arm level, right arm, initial encounter: Secondary | ICD-10-CM | POA: Diagnosis not present

## 2016-02-23 DIAGNOSIS — M25511 Pain in right shoulder: Secondary | ICD-10-CM | POA: Diagnosis present

## 2016-02-23 DIAGNOSIS — Y9289 Other specified places as the place of occurrence of the external cause: Secondary | ICD-10-CM | POA: Diagnosis not present

## 2016-02-23 DIAGNOSIS — Y998 Other external cause status: Secondary | ICD-10-CM | POA: Diagnosis not present

## 2016-02-23 DIAGNOSIS — Z87891 Personal history of nicotine dependence: Secondary | ICD-10-CM | POA: Diagnosis not present

## 2016-02-23 DIAGNOSIS — Y9389 Activity, other specified: Secondary | ICD-10-CM | POA: Insufficient documentation

## 2016-02-23 MED ORDER — CYCLOBENZAPRINE HCL 10 MG PO TABS: 10.0000 mg | ORAL_TABLET | Freq: Two times a day (BID) | ORAL | Status: DC | PRN

## 2016-02-23 MED ORDER — DEXAMETHASONE SODIUM PHOSPHATE 10 MG/ML IJ SOLN
10.0000 mg | Freq: Once | INTRAMUSCULAR | Status: AC
  Administered 2016-02-23: 10 mg via INTRAMUSCULAR
  Filled 2016-02-23: qty 1

## 2016-02-23 MED ORDER — IBUPROFEN 800 MG PO TABS: 800.0000 mg | ORAL_TABLET | Freq: Three times a day (TID) | ORAL | Status: DC

## 2016-02-23 MED ORDER — HYDROCODONE-ACETAMINOPHEN 5-325 MG PO TABS: 2.0000 | ORAL_TABLET | ORAL | Status: DC | PRN

## 2016-02-23 NOTE — ED Notes (Signed)
Patient here with right shoulder pain since Friday. 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

## 2016-02-23 NOTE — ED Provider Notes (Signed)
CSN: [id number]     Arrival date & time 02/23/16  1146 History  By signing my name below, I, [name], attest that this documentation has been prepared under the direction and in the presence of Danelle Berry, PA-C. Electronically Signed: [name], ED Scribe. 02/23/2016. 2:12 PM.    Chief Complaint  Patient presents with  . Shoulder Pain   The history is provided by the patient. No language interpreter was used.    HPI Comments: Christine Orozco is a 30 y.o. female who presents to the Emergency Department complaining of gradual-onset, constant, worsening right shoulder pain that began 4 days ago. 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 She reports associated difficulty sleeping secondary to her pain. No treatments or modifying factors were noted. Patient reports a history of left shoulder injury but denies a history of right shoulder issues. She denies redness, fever, or swelling.   Past Medical History  Diagnosis Date  . Hepatitis B antibody positive    Past Surgical History  Procedure Laterality Date  . Extraction of wisdom teeth     Family History  Problem Relation Age of Onset  . Adopted: Yes   Social History  Substance Use Topics  . Smoking status: Former Smoker -- 0.25 packs/day  . Smokeless tobacco: None  . Alcohol Use: Yes     Comment: rare   OB History    No data available     Review of Systems  Constitutional: Negative for fever.  Musculoskeletal: Positive for arthralgias. Negative for joint swelling.  Skin: Negative for color change.  Psychiatric/Behavioral: Positive for sleep disturbance (secondary to pain).  All other systems reviewed and are negative.   Allergies  Beef-derived products; Toradol; Augmentin; Naldecon senior; Ultram; Careers adviser; Benadryl; Sudafed; and Zyrtec  Home Medications   Prior to Admission medications   Medication  Sig Start Date End Date Taking? Authorizing Provider  acetaminophen (TYLENOL) 500 MG tablet Take 2 tablets (1,000 mg total) by mouth every 8 (eight) hours as needed for mild pain or moderate pain. 01/22/16   Lavera Guise, MD  ciprofloxacin (CILOXAN) 0.3 % ophthalmic solution Place 2 drops into the left eye every 4 (four) hours while awake. Administer 1 drop, every 2 hours, while awake, for 2 days. Then 1 drop, every 4 hours, while awake, for the next 5 days. 11/28/15   Audry Pili, PA-C  cyclobenzaprine (FLEXERIL) 10 MG tablet Take 1 tablet (10 mg total) by mouth 2 (two) times daily as needed for muscle spasms. 02/23/16   Danelle Berry, PA-C  HYDROcodone-acetaminophen (NORCO/VICODIN) 5-325 MG tablet Take 2 tablets by mouth every 4 (four) hours as needed. 02/23/16   Danelle Berry, PA-C  ibuprofen (ADVIL,MOTRIN) 800 MG tablet Take 1 tablet (800 mg total) by mouth 3 (three) times daily. 02/23/16   Danelle Berry, PA-C  ondansetron (ZOFRAN) 8 MG tablet Take 1 tablet (8 mg total) by mouth every 8 (eight) hours as needed for nausea or vomiting. 12/02/15   Mancel Bale, MD   BP 124/86 mmHg  Pulse 96  Temp(Src) 98.1 F (36.7 C) (Oral)  Resp 18  SpO2 100% Physical Exam  Constitutional: She is oriented to person, place, and time. She appears well-developed and well-nourished. No distress.  HENT:  Head: Normocephalic and atraumatic.  Eyes: Conjunctivae and EOM are normal.  Neck: Neck supple. No tracheal deviation present.  Cardiovascular: Normal  rate.   Pulmonary/Chest: Effort normal. No respiratory distress.  Musculoskeletal: Normal range of motion.  No bony tenderness from sternoclavicular joint, clavicle, AC, bicipital groove, glenohumeral fossa, or scapula. No edema or erythema. Full passive and active ROM. Normal sensation and strength. Positive Apley's scratch test. Negative empty can test.   Neurological: She is alert and oriented to person, place, and time.  Skin: Skin is warm and dry.  Psychiatric: She has  a normal mood and affect. Her behavior is normal.  Nursing note and vitals reviewed.   ED Course  Procedures (including critical care time)  DIAGNOSTIC STUDIES: Oxygen Saturation is 100% on RA, normal by my interpretation.    COORDINATION OF CARE: 1:57 PM - Discussed treatment plan with pt at bedside which includes Norco, Flexeril, and ibuprofen Advised follow-up with orthopedics. Pt verbalized understanding and agreed to plan.   MDM   Final diagnoses:  Right shoulder strain, initial encounter   Patient with shoulder pain without any known injury. No bony tenderness on exam, medication for imaging.  Pain managed in ED. Pt advised to follow up with orthopedics. Conservative therapy recommended and discussed. Patient will be dc home with Norco, Flexeril, and ibuprofen, & is agreeable with above plan.   I personally performed the services described in this documentation, which was scribed in my presence. The recorded information has been reviewed and is accurate.       Danelle BerryLeisa Tomeko Scov[place], PA-C 02/28/16 0005  [name], MD 03/01/16 1328

## 2016-02-23 NOTE — ED Notes (Signed)
Pt is in stable condition upon d/c and ambulates from ED. 

## 2016-02-23 NOTE — Discharge Instructions (Signed)

## 2016-03-16 ENCOUNTER — Encounter (HOSPITAL_COMMUNITY): Payer: Self-pay | Admitting: *Deleted

## 2016-03-16 ENCOUNTER — Inpatient Hospital Stay (HOSPITAL_COMMUNITY)
Admission: AD | Admit: 2016-03-16 | Discharge: 2016-03-16 | Disposition: A | Discharge status: Home / Self Care | Payer: Medicaid Other | Source: Ambulatory Visit | Attending: Family Medicine | Admitting: Family Medicine

## 2016-03-16 DIAGNOSIS — Z114 Encounter for screening for human immunodeficiency virus [HIV]: Secondary | ICD-10-CM | POA: Insufficient documentation

## 2016-03-16 DIAGNOSIS — A499 Bacterial infection, unspecified: Secondary | ICD-10-CM

## 2016-03-16 DIAGNOSIS — Z91018 Allergy to other foods: Secondary | ICD-10-CM | POA: Insufficient documentation

## 2016-03-16 DIAGNOSIS — Z113 Encounter for screening for infections with a predominantly sexual mode of transmission: Secondary | ICD-10-CM | POA: Diagnosis not present

## 2016-03-16 DIAGNOSIS — Z3202 Encounter for pregnancy test, result negative: Secondary | ICD-10-CM | POA: Diagnosis not present

## 2016-03-16 DIAGNOSIS — N760 Acute vaginitis: Secondary | ICD-10-CM | POA: Diagnosis not present

## 2016-03-16 DIAGNOSIS — Z888 Allergy status to other drugs, medicaments and biological substances status: Secondary | ICD-10-CM | POA: Diagnosis not present

## 2016-03-16 DIAGNOSIS — Z8619 Personal history of other infectious and parasitic diseases: Secondary | ICD-10-CM | POA: Diagnosis not present

## 2016-03-16 DIAGNOSIS — R109 Unspecified abdominal pain: Secondary | ICD-10-CM | POA: Insufficient documentation

## 2016-03-16 DIAGNOSIS — Z8742 Personal history of other diseases of the female genital tract: Secondary | ICD-10-CM | POA: Insufficient documentation

## 2016-03-16 DIAGNOSIS — Z87891 Personal history of nicotine dependence: Secondary | ICD-10-CM | POA: Insufficient documentation

## 2016-03-16 DIAGNOSIS — Z880 Allergy status to penicillin: Secondary | ICD-10-CM | POA: Insufficient documentation

## 2016-03-16 DIAGNOSIS — R300 Dysuria: Secondary | ICD-10-CM | POA: Diagnosis not present

## 2016-03-16 DIAGNOSIS — B9689 Other specified bacterial agents as the cause of diseases classified elsewhere: Secondary | ICD-10-CM

## 2016-03-16 HISTORY — DX: Unspecified abnormal cytological findings in specimens from vagina: R87.629

## 2016-03-16 LAB — URINE MICROSCOPIC-ADD ON

## 2016-03-16 LAB — WET PREP, GENITAL
SPERM: NONE SEEN
TRICH WET PREP: NONE SEEN
Yeast Wet Prep HPF POC: NONE SEEN

## 2016-03-16 LAB — CBC
HEMATOCRIT: 39.1 % (ref 36.0–46.0)
HEMOGLOBIN: 13.3 g/dL (ref 12.0–15.0)
MCH: 30 pg (ref 26.0–34.0)
MCHC: 34 g/dL (ref 30.0–36.0)
MCV: 88.1 fL (ref 78.0–100.0)
Platelets: 227 10*3/uL (ref 150–400)
RBC: 4.44 MIL/uL (ref 3.87–5.11)
RDW: 13.9 % (ref 11.5–15.5)
WBC: 9.9 10*3/uL (ref 4.0–10.5)

## 2016-03-16 LAB — URINALYSIS, ROUTINE W REFLEX MICROSCOPIC
Bilirubin Urine: NEGATIVE
Glucose, UA: NEGATIVE mg/dL
Ketones, ur: NEGATIVE mg/dL
LEUKOCYTES UA: NEGATIVE
NITRITE: NEGATIVE
Protein, ur: NEGATIVE mg/dL
SPECIFIC GRAVITY, URINE: 1.025 (ref 1.005–1.030)
pH: 5.5 (ref 5.0–8.0)

## 2016-03-16 LAB — POCT PREGNANCY, URINE: PREG TEST UR: NEGATIVE

## 2016-03-16 MED ORDER — PHENAZOPYRIDINE HCL 100 MG PO TABS
100.0000 mg | ORAL_TABLET | Freq: Once | ORAL | Status: AC
  Administered 2016-03-16: 100 mg via ORAL
  Filled 2016-03-16: qty 1

## 2016-03-16 MED ORDER — METRONIDAZOLE 500 MG PO TABS: 500.0000 mg | ORAL_TABLET | Freq: Two times a day (BID) | ORAL | Status: DC

## 2016-03-16 MED ORDER — NORGESTIMATE-ETH ESTRADIOL 0.25-35 MG-MCG PO TABS: 1.0000 | ORAL_TABLET | Freq: Every day | ORAL | Status: DC

## 2016-03-16 MED ORDER — PHENAZOPYRIDINE HCL 200 MG PO TABS
200.0000 mg | ORAL_TABLET | Freq: Three times a day (TID) | ORAL | Status: DC
Start: 2016-03-16 — End: 2016-04-25

## 2016-03-16 NOTE — MAU Note (Signed)
Pt states she started to have bilateral lower abd pain since last night.  Constant stabbing and burning pain that wont stop.  Hx of cyst.  Pt states she is currently living in a hotel and trying to get back on her feet.  She had a pap smear July/August of last year at [facility] de[facility]  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  Pt was unable to go to appointments due to homelessness.  Pt LMP 05-2016.  Last Depo shot in Aug.

## 2016-03-16 NOTE — MAU Provider Note (Signed)
History     CSN: [id number]  Arrival date and time: 03/16/16 [id number]    First Provider Initiated Contact with Patient 03/16/16 [phone]       Chief Complaint  Patient presents with  . Abdominal Pain   HPI  Christine Orozco is a 30 y.o. [id number] female who presents with abdominal pain & dysuria. Symptoms began last night. Reports burning pain in suprapubic area that is constant. Rates pain 8/10. Took ibuprofen at 6 am with minima relief. Associated symptom includes dysuria. Denies n/v, fever/chills, vaginal bleeding, vaginal discharge, flank pain, or hematuria. Pt is sexually active & desires STD testing today.  LMP 07/2015. Last depo injection 05/2015. No contraception since then. 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 Now has active medicaid & is concerned about waiting too long for her follow up.   OB History    Gravida Para Term Preterm AB TAB SAB Ectopic Multiple Living   2 1 1  1  1   1       Past Medical History  Diagnosis Date  . Hepatitis B antibody positive   . Vaginal Pap smear, abnormal     Past Surgical History  Procedure Laterality Date  . Extraction of wisdom teeth      Family History  Problem Relation Age of Onset  . Adopted: Yes    Social History  Substance Use Topics  . Smoking status: Former Smoker -- 0.25 packs/day  . Smokeless tobacco: None  . Alcohol Use: Yes     Comment: rare    Allergies:  Allergies  Allergen Reactions  . Beef-Derived Products Nausea And Vomiting    Activity vomits     . Toradol [Ketorolac Tromethamine] Anaphylaxis  . Augmentin [Amoxicillin-Pot Clavulanate] Other (See Comments)    Childhood, pt states she can take penicillin  . Naldecon Senior [Guaifenesin] Nausea And Vomiting    Hives   . Ultram [Tramadol] Other (See Comments)    Hallucinate and heart stop  . Allegra [Fexofenadine]  Palpitations    hyperactive  . Benadryl [Diphenhydramine] Palpitations  . Sudafed [Pseudoephedrine] Palpitations    hyperactivity   . Zyrtec [Cetirizine] Palpitations    Hyperactive     Prescriptions prior to admission  Medication Sig Dispense Refill Last Dose  . acetaminophen (TYLENOL) 500 MG tablet Take 2 tablets (1,000 mg total) by mouth every 8 (eight) hours as needed for mild pain or moderate pain. 30 tablet 0   . ciprofloxacin (CILOXAN) 0.3 % ophthalmic solution Place 2 drops into the left eye every 4 (four) hours while awake. Administer 1 drop, every 2 hours, while awake, for 2 days. Then 1 drop, every 4 hours, while awake, for the next 5 days. 5 mL 0 12/01/2015 at Unknown time  . cyclobenzaprine (FLEXERIL) 10 MG tablet Take 1 tablet (10 mg total) by mouth 2 (two) times daily as needed for muscle spasms. 20 tablet 0   . HYDROcodone-acetaminophen (NORCO/VICODIN) 5-325 MG tablet Take 2 tablets by mouth every 4 (four) hours as needed. 6 tablet 0   . ibuprofen (ADVIL,MOTRIN) 800 MG tablet Take 1 tablet (800 mg total) by mouth 3 (three) times daily. 21 tablet 0   . ondansetron (ZOFRAN) 8 MG tablet Take 1 tablet (8 mg total) by mouth every 8 (eight) hours as needed for nausea or vomiting. 12 tablet 0  Review of Systems  Constitutional: Negative for fever and chills.  Gastrointestinal: Positive for abdominal pain. Negative for nausea, vomiting, diarrhea and constipation.  Genitourinary: Positive for dysuria. Negative for frequency, hematuria and flank pain.       No vaginal bleeding or discharge No dyspareunia or postcoital bleeding   Physical Exam   Blood pressure 131/87, pulse 85, temperature 98.2 F (36.8 C), temperature source Oral, resp. rate 18, last menstrual period 05/17/2015, SpO2 99 %.  Physical Exam  Nursing note and vitals reviewed. Constitutional: She is oriented to person, place, and time. She appears well-developed and well-nourished. No distress.  HENT:  Head:  Normocephalic and atraumatic.  Eyes: Conjunctivae are normal. Right eye exhibits no discharge. Left eye exhibits no discharge. No scleral icterus.  Neck: Normal range of motion.  Cardiovascular: Normal rate, regular rhythm and normal heart sounds.   No murmur heard. Respiratory: Effort normal and breath sounds normal. No respiratory distress. She has no wheezes.  GI: Soft. Bowel sounds are normal. She exhibits no distension. There is no tenderness. There is no rebound and no CVA tenderness.  Genitourinary: Vagina normal and uterus normal. Cervix exhibits discharge (small amount of thin white discharge). Cervix exhibits no motion tenderness and no friability. Right adnexum displays no mass, no tenderness and no fullness. Left adnexum displays no mass, no tenderness and no fullness.  Neurological: She is alert and oriented to person, place, and time.  Skin: Skin is warm and dry. She is not diaphoretic.  Psychiatric: She has a normal mood and affect. Her behavior is normal. Judgment and thought content normal.    MAU Course  Procedures Results for orders placed or performed during the hospital encounter of 03/16/16 (from the past 24 hour(s))  Urinalysis, Routine w reflex microscopic (not at [facility])     Status: Abnormal   Collection Time: 03/16/16  8:08 AM  Result Value Ref Range   Color, Urine YELLOW YELLOW   APPearance CLEAR CLEAR   Specific Gravity, Urine 1.025 1.005 - 1.030   pH 5.5 5.0 - 8.0   Glucose, UA NEGATIVE NEGATIVE mg/dL   Hgb urine dipstick TRACE (A) NEGATIVE   Bilirubin Urine NEGATIVE NEGATIVE   Ketones, ur NEGATIVE NEGATIVE mg/dL   Protein, ur NEGATIVE NEGATIVE mg/dL   Nitrite NEGATIVE NEGATIVE   Leukocytes, UA NEGATIVE NEGATIVE  Urine microscopic-add on     Status: Abnormal   Collection Time: 03/16/16  8:08 AM  Result Value Ref Range   Squamous Epithelial / LPF 6-30 (A) NONE SEEN   WBC, UA 0-5 0 - 5 WBC/hpf   RBC / HPF 0-5 0 - 5 RBC/hpf   Bacteria, UA MANY (A) NONE SEEN    Urine-Other MUCOUS PRESENT   Pregnancy, urine POC     Status: None   Collection Time: 03/16/16  8:14 AM  Result Value Ref Range   Preg Test, Ur NEGATIVE NEGATIVE  CBC     Status: None   Collection Time: 03/16/16  8:48 AM  Result Value Ref Range   WBC 9.9 4.0 - 10.5 K/uL   RBC 4.44 3.87 - 5.11 MIL/uL   Hemoglobin 13.3 12.0 - 15.0 g/dL   HCT 16.1 09.6 - 04.5 %   MCV 88.1 78.0 - 100.0 fL   MCH 30.0 26.0 - 34.0 pg   MCHC 34.0 30.0 - 36.0 g/dL   RDW 40.9 81.1 - 91.4 %   Platelets 227 150 - 400 K/uL  Wet prep, genital     Status: Abnormal  Collection Time: 03/16/16  9:10 AM  Result Value Ref Range   Yeast Wet Prep HPF POC NONE SEEN NONE SEEN   Trich, Wet Prep NONE SEEN NONE SEEN   Clue Cells Wet Prep HPF POC PRESENT (A) NONE SEEN   WBC, Wet Prep HPF POC FEW (A) NONE SEEN   Sperm NONE SEEN     MDM UPT negative CBC, HIV, GC/CT, wet prep Pyridium  Records requested from Health Dept Urine culture sent Assessment and Plan  A: 1. BV (bacterial vaginosis)   2. Screen for STD (sexually transmitted disease)   3. Dysuria     P: Discharge home Rx flagyl, sprintec, pyridium Urine culture pending GC/CT & HIV pending Msg sent to Clinic regarding f/u for abnormal pap  Judeth Horn 03/16/2016, 8:28 AM

## 2016-03-16 NOTE — Discharge Instructions (Signed)
Bacterial Vaginosis Bacterial vaginosis is a vaginal infection that occurs when the normal balance of bacteria in the vagina is disrupted. It results from an overgrowth of certain bacteria. This is the most common vaginal infection in women of childbearing age. Treatment is important to prevent complications, especially in pregnant women, as it can cause a premature delivery. CAUSES  Bacterial vaginosis is caused by an increase in harmful bacteria that are normally present in smaller amounts in the vagina. Several different kinds of bacteria can cause bacterial vaginosis. However, the reason that the condition develops is not fully understood. RISK FACTORS Certain activities or behaviors can put you at an increased risk of developing bacterial vaginosis, including:  Having a new sex partner or multiple sex partners.  Douching.  Using an intrauterine device (IUD) for contraception. Women do not get bacterial vaginosis from toilet seats, bedding, swimming pools, or contact with objects around them. SIGNS AND SYMPTOMS  Some women with bacterial vaginosis have no signs or symptoms. Common symptoms include:  Grey vaginal discharge.  A fishlike odor with discharge, especially after sexual intercourse.  Itching or burning of the vagina and vulva.  Burning or pain with urination. DIAGNOSIS  Your health care provider will take a medical history and examine the vagina for signs of bacterial vaginosis. A sample of vaginal fluid may be taken. Your health care provider will look at this sample under a microscope to check for bacteria and abnormal cells. A vaginal pH test may also be done.  TREATMENT  Bacterial vaginosis may be treated with antibiotic medicines. These may be given in the form of a pill or a vaginal cream. A second round of antibiotics may be prescribed if the condition comes back after treatment. Because bacterial vaginosis increases your risk for sexually transmitted diseases, getting  treated can help reduce your risk for chlamydia, gonorrhea, HIV, and herpes. HOME CARE INSTRUCTIONS   Only take over-the-counter or prescription medicines as directed by your health care provider.  If antibiotic medicine was prescribed, take it as directed. Make sure you finish it even if you start to feel better.  During treatment, it is important that you follow these instructions:  Avoid sexual activity or use condoms correctly.  Do not douche.  Avoid alcohol as directed by your health care provider.  Avoid breastfeeding as directed by your health care provider. SEEK MEDICAL CARE IF:   Your symptoms are not improving after 3 days of treatment.  You have increased discharge or pain.  You have a fever. MAKE SURE YOU:   Understand these instructions.  Will watch your condition.  Will get help right away if you are not doing well or get worse. FOR MORE INFORMATION  Centers for Disease Control and Prevention, Division of STD Prevention: [employer]s.[employer] American Sexual Health Association (ASHA): www.ashastd.org    This information is not intended to replace advice given to you by your health care provider. Make sure you discuss any questions you have with your health care provider.   Document Released: 09/19/2005 Document Revised: 10/10/2014 Document Reviewed: 05/01/2013 Elsevier Interactive Patient Education 2016 Elsevier Inc. Dysuria Dysuria is pain or discomfort while urinating. The pain or discomfort may be felt in the tube that carries urine out of the bladder (urethra) or in the surrounding tissue of the genitals. The pain may also be felt in the groin area, lower abdomen, and lower back. You may have to urinate frequently or have the sudden feeling that you have to urinate (urgency). Dysuria can affect  both men and women, but is more common in women. Dysuria can be caused by many different things, including:  Urinary tract infection in women.  Infection of the kidney  or bladder.  Kidney stones or bladder stones.  Certain sexually transmitted infections (STIs), such as chlamydia.  Dehydration.  Inflammation of the vagina.  Use of certain medicines.  Use of certain soaps or scented products that cause irritation. HOME CARE INSTRUCTIONS Watch your dysuria for any changes. The following actions may help to reduce any discomfort you are feeling:  Drink enough fluid to keep your urine clear or pale yellow.  Empty your bladder often. Avoid holding urine for long periods of time.  After a bowel movement or urination, women should cleanse from front to back, using each tissue only once.  Empty your bladder after sexual intercourse.  Take medicines only as directed by your health care provider.  If you were prescribed an antibiotic medicine, finish it all even if you start to feel better.  Avoid caffeine, tea, and alcohol. They can irritate the bladder and make dysuria worse. In men, alcohol may irritate the prostate.  Keep all follow-up visits as directed by your health care provider. This is important.  If you had any tests done to find the cause of dysuria, it is your responsibility to obtain your test results. Ask the lab or department performing the test when and how you will get your results. Talk with your health care provider if you have any questions about your results. SEEK MEDICAL CARE IF:  You develop pain in your back or sides.  You have a fever.  You have nausea or vomiting.  You have blood in your urine.  You are not urinating as often as you usually do. SEEK IMMEDIATE MEDICAL CARE IF:  You pain is severe and not relieved with medicines.  You are unable to hold down any fluids.  You or someone else notices a change in your mental function.  You have a rapid heartbeat at rest.  You have shaking or chills.  You feel extremely weak.   This information is not intended to replace advice given to you by your health care  provider. Make sure you discuss any questions you have with your health care provider.   Document Released: 06/17/2004 Document Revised: 10/10/2014 Document Reviewed: 05/15/2014 Elsevier Interactive Patient Education [employer]2016 Elsevier Inc.

## 2016-03-17 ENCOUNTER — Encounter: Payer: Self-pay | Admitting: *Deleted

## 2016-03-17 LAB — HIV ANTIBODY (ROUTINE TESTING W REFLEX): HIV Screen 4th Generation wRfx: NONREACTIVE

## 2016-03-17 LAB — GC/CHLAMYDIA PROBE AMP (~~LOC~~) NOT AT ARMC
CHLAMYDIA, DNA PROBE: NEGATIVE
Neisseria Gonorrhea: NEGATIVE

## 2016-03-17 LAB — URINE CULTURE

## 2016-03-17 LAB — RPR: RPR Ser Ql: NONREACTIVE

## 2016-03-24 ENCOUNTER — Encounter: Payer: Self-pay | Admitting: Obstetrics and Gynecology

## 2016-03-24 ENCOUNTER — Ambulatory Visit (INDEPENDENT_AMBULATORY_CARE_PROVIDER_SITE_OTHER): Payer: Medicaid Other | Admitting: Obstetrics and Gynecology

## 2016-03-24 ENCOUNTER — Other Ambulatory Visit (HOSPITAL_COMMUNITY)
Admission: RE | Admit: 2016-03-24 | Discharge: 2016-03-24 | Disposition: A | Discharge status: Home / Self Care | Payer: Medicaid Other | Source: Ambulatory Visit | Attending: Obstetrics and Gynecology | Admitting: Obstetrics and Gynecology

## 2016-03-24 VITALS — BP 124/64 | HR 79 | Wt 126.8 lb

## 2016-03-24 DIAGNOSIS — R87613 High grade squamous intraepithelial lesion on cytologic smear of cervix (HGSIL): Secondary | ICD-10-CM | POA: Diagnosis present

## 2016-03-24 LAB — POCT PREGNANCY, URINE: PREG TEST UR: NEGATIVE

## 2016-03-24 NOTE — Addendum Note (Signed)
Addended by: Faythe CasaBELLAMY, Chalyn Amescua M on: 03/24/2016 03:14 PM   Modules accepted: Orders

## 2016-03-24 NOTE — Progress Notes (Signed)
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  Placed in lithotomy position. Cervix viewed with speculum and colposcope after application of acetic acid.   Colposcopy adequate?  no Acetowhite lesions?yes 2, 6 and 9 o'clock Punctation?no Mosaicism?  no Abnormal vasculature?  no Biopsies?yes 2, 6 and 9 o'clock ECC?yes  COMMENTS: Patient was given post procedure instructions.  She will return in 2 weeks for results and LEEP  Christine Shelburne, MD

## 2016-03-28 ENCOUNTER — Telehealth: Payer: Self-pay

## 2016-03-28 ENCOUNTER — Encounter: Payer: Self-pay | Admitting: Obstetrics and Gynecology

## 2016-03-28 ENCOUNTER — Encounter: Payer: Self-pay | Admitting: Family Medicine

## 2016-03-28 NOTE — Telephone Encounter (Signed)
Called pt to try to inform her of results did not speak to pt but instructed her to call at her convince for her results.

## 2016-03-28 NOTE — Telephone Encounter (Signed)
-----   Message from Catalina AntiguaPeggy Constant, MD sent at 03/26/2016  9:54 AM EDT ----- Inform the patient of colpo consistent with pap smear. She will need a LEEP as previously discussed

## 2016-03-28 NOTE — Telephone Encounter (Signed)
Patient called returning my phone call. I was able to inform her of her colpo results and told her she should return back for her LEEP procedure on 04/25/16. Pt states understanding and has no further questions at this time and said she will see us on the 7/24 appt.

## 2016-04-25 ENCOUNTER — Other Ambulatory Visit (HOSPITAL_COMMUNITY)
Admission: RE | Admit: 2016-04-25 | Discharge: 2016-04-25 | Disposition: A | Discharge status: Home / Self Care | Payer: Medicaid Other | Source: Ambulatory Visit | Attending: Obstetrics and Gynecology | Admitting: Obstetrics and Gynecology

## 2016-04-25 ENCOUNTER — Ambulatory Visit (INDEPENDENT_AMBULATORY_CARE_PROVIDER_SITE_OTHER): Payer: Medicaid Other | Admitting: Obstetrics and Gynecology

## 2016-04-25 ENCOUNTER — Encounter: Payer: Self-pay | Admitting: Obstetrics and Gynecology

## 2016-04-25 VITALS — BP 124/74 | HR 61 | Ht 63.0 in | Wt 130.0 lb

## 2016-04-25 DIAGNOSIS — R87613 High grade squamous intraepithelial lesion on cytologic smear of cervix (HGSIL): Secondary | ICD-10-CM | POA: Insufficient documentation

## 2016-04-25 DIAGNOSIS — Z3202 Encounter for pregnancy test, result negative: Secondary | ICD-10-CM

## 2016-04-25 NOTE — Progress Notes (Signed)
Patient identified, informed consent obtained, signed copy in chart, time out performed.  Pap smear and colposcopy reviewed.   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  Cervix visualized. Paracervical block placed.  Large size LOOP used to remove cone of cervix using blend of cut and cautery on LEEP machine.  Edges/Base cauterized with Ball.  Monsel's solution used for hemostasis.  Patient tolerated procedure well.  Patient given post procedure instructions.  Follow up in 6 months for repeat pap or as needed.

## 2016-04-27 LAB — POCT PREGNANCY, URINE: Preg Test, Ur: NEGATIVE

## 2016-05-16 ENCOUNTER — Encounter (HOSPITAL_COMMUNITY): Payer: Self-pay

## 2016-05-16 ENCOUNTER — Emergency Department (HOSPITAL_COMMUNITY): Payer: Medicaid Other

## 2016-05-16 ENCOUNTER — Emergency Department (HOSPITAL_COMMUNITY)
Admission: EM | Admit: 2016-05-16 | Discharge: 2016-05-16 | Disposition: A | Discharge status: Home / Self Care | Payer: Medicaid Other | Attending: Emergency Medicine | Admitting: Emergency Medicine

## 2016-05-16 DIAGNOSIS — Z791 Long term (current) use of non-steroidal anti-inflammatories (NSAID): Secondary | ICD-10-CM | POA: Insufficient documentation

## 2016-05-16 DIAGNOSIS — R1031 Right lower quadrant pain: Secondary | ICD-10-CM | POA: Insufficient documentation

## 2016-05-16 DIAGNOSIS — R112 Nausea with vomiting, unspecified: Secondary | ICD-10-CM | POA: Insufficient documentation

## 2016-05-16 DIAGNOSIS — R197 Diarrhea, unspecified: Secondary | ICD-10-CM | POA: Insufficient documentation

## 2016-05-16 LAB — CBC
HEMATOCRIT: 42 % (ref 36.0–46.0)
Hemoglobin: 14 g/dL (ref 12.0–15.0)
MCH: 30.4 pg (ref 26.0–34.0)
MCHC: 33.3 g/dL (ref 30.0–36.0)
MCV: 91.3 fL (ref 78.0–100.0)
Platelets: 222 10*3/uL (ref 150–400)
RBC: 4.6 MIL/uL (ref 3.87–5.11)
RDW: 14 % (ref 11.5–15.5)
WBC: 7.1 10*3/uL (ref 4.0–10.5)

## 2016-05-16 LAB — COMPREHENSIVE METABOLIC PANEL
ALK PHOS: 62 U/L (ref 38–126)
ALT: 16 U/L (ref 14–54)
ANION GAP: 7 (ref 5–15)
AST: 25 U/L (ref 15–41)
Albumin: 4 g/dL (ref 3.5–5.0)
BILIRUBIN TOTAL: 0.3 mg/dL (ref 0.3–1.2)
BUN: 11 mg/dL (ref 6–20)
CALCIUM: 8.3 mg/dL — AB (ref 8.9–10.3)
CO2: 21 mmol/L — ABNORMAL LOW (ref 22–32)
Chloride: 108 mmol/L (ref 101–111)
Creatinine, Ser: 0.94 mg/dL (ref 0.44–1.00)
GFR calc Af Amer: >60 mL/min (ref >60)
Glucose, Bld: 89 mg/dL (ref 65–99)
POTASSIUM: 3.5 mmol/L (ref 3.5–5.1)
Sodium: 136 mmol/L (ref 135–145)
TOTAL PROTEIN: 7.4 g/dL (ref 6.5–8.1)

## 2016-05-16 LAB — URINE MICROSCOPIC-ADD ON: RBC / HPF: NONE SEEN RBC/hpf (ref 0–5)

## 2016-05-16 LAB — URINALYSIS, ROUTINE W REFLEX MICROSCOPIC
Glucose, UA: NEGATIVE mg/dL
Hgb urine dipstick: NEGATIVE
KETONES UR: NEGATIVE mg/dL
NITRITE: NEGATIVE
PH: 5.5 (ref 5.0–8.0)
PROTEIN: 30 mg/dL — AB
Specific Gravity, Urine: 1.034 — ABNORMAL HIGH (ref 1.005–1.030)

## 2016-05-16 LAB — LIPASE, BLOOD: Lipase: 18 U/L (ref 11–51)

## 2016-05-16 LAB — I-STAT BETA HCG BLOOD, ED (MC, WL, AP ONLY)

## 2016-05-16 MED ORDER — ONDANSETRON HCL 4 MG/2ML IJ SOLN
4.0000 mg | Freq: Once | INTRAMUSCULAR | Status: AC
  Administered 2016-05-16: 4 mg via INTRAVENOUS
  Filled 2016-05-16: qty 2

## 2016-05-16 MED ORDER — BUTALBITAL-APAP-CAFFEINE 50-325-40 MG PO TABS
1.0000 | ORAL_TABLET | Freq: Once | ORAL | Status: AC
  Administered 2016-05-16: 1 via ORAL
  Filled 2016-05-16: qty 1

## 2016-05-16 MED ORDER — MORPHINE SULFATE (PF) 4 MG/ML IV SOLN
4.0000 mg | Freq: Once | INTRAVENOUS | Status: AC
Start: 2016-05-16 — End: 2016-05-16
  Administered 2016-05-16: 4 mg via INTRAVENOUS
  Filled 2016-05-16: qty 1

## 2016-05-16 MED ORDER — LORATADINE 10 MG PO TABS
5.0000 mg | ORAL_TABLET | Freq: Every day | ORAL | Status: DC
  Administered 2016-05-16: 5 mg via ORAL
  Filled 2016-05-16: qty 1

## 2016-05-16 NOTE — ED Notes (Signed)
Redness to IV site and left forearm has resolved. Decreased itching. No signs of infiltration.

## 2016-05-16 NOTE — ED Notes (Signed)
MD at bedside. 

## 2016-05-16 NOTE — Discharge Instructions (Signed)
Take 4 over the counter ibuprofen tablets 3 times a day or 2 over-the-counter naproxen tablets twice a day for pain. Also take tylenol 1000mg(2 extra strength) four times a day.    

## 2016-05-16 NOTE — ED Notes (Signed)
Pt made aware of needed urine specimen  

## 2016-05-16 NOTE — ED Triage Notes (Signed)
Pt c/o anterior headache and R side abdominal pain x 1 day and n/v/d starting this morning.  Pain score 9/10.  Pt has not taken anything for symptoms.

## 2016-05-16 NOTE — ED Provider Notes (Signed)
[facility]-EMERGENCY DEPT Provider Note   CSN: [id number] Arrival date & time: 05/16/16  1033     History   Chief Complaint Chief Complaint  Patient presents with  . Migraine  . Abdominal Pain  . Emesis  . Diarrhea    HPI Christine Orozco is a 30 y.o. female.  30 yo F with a chief complaint of right lower quadrant abdominal pain. Going on for the past couple days. Associated with some vomiting and diarrhea. Denies fevers or chills. Denies injury. Denies vaginal bleeding or vaginal discharge. Nothing seems to make this better or worse. Denies radiation of pain. Constant pain.   The history is provided by the patient.  Migraine  Associated symptoms include abdominal pain. Pertinent negatives include no chest pain, no headaches and no shortness of breath.  Abdominal Pain   This is a new problem. The current episode started 2 days ago. The problem occurs constantly. The problem has been gradually worsening. The pain is associated with eating. The pain is located in the RLQ. The quality of the pain is sharp and shooting. The pain is at a severity of 10/10. The pain is severe. Associated symptoms include diarrhea, nausea and vomiting. Pertinent negatives include fever, dysuria, headaches, arthralgias and myalgias. Nothing aggravates the symptoms. Nothing relieves the symptoms.  Emesis   Associated symptoms include abdominal pain and diarrhea. Pertinent negatives include no arthralgias, no chills, no fever, no headaches and no myalgias.  Diarrhea   Associated symptoms include abdominal pain and vomiting. Pertinent negatives include no chills, no headaches, no arthralgias and no myalgias.    Past Medical History:  Diagnosis Date  . Hepatitis B antibody positive   . Vaginal Pap smear, abnormal     Patient Active Problem List   Diagnosis Date Noted  . 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 CERVICAL BIOPSY  W/ LOOP ELECTRODE  EXCISION    . extraction of wisdom teeth      OB History    Gravida Para Term Preterm AB Living   2 1 1   1 1    SAB TAB Ectopic Multiple Live Births   1               Home Medications    Prior to Admission medications   Medication Sig Start Date End Date Taking? Authorizing Provider  ibuprofen (ADVIL,MOTRIN) 200 MG tablet Take 400 mg by mouth every 6 (six) hours as needed for moderate pain or cramping.   Yes Historical Provider, MD  norgestimate-ethinyl estradiol (ORTHO-CYCLEN,SPRINTEC,PREVIFEM) 0.25-35 MG-MCG tablet Take 1 tablet by mouth daily. 03/16/16  Yes Judeth HornErin Lawrence, NP    Family History Family History  Problem Relation Age of Onset  . Adopted: Yes    Social History Social History  Substance Use Topics  . Smoking status: Former Smoker    Packs/day: 0.25  . Smokeless tobacco: Never Used  . Alcohol use Yes     Comment: rare     Allergies   Beef-derived products; Toradol [ketorolac tromethamine]; Augmentin [amoxicillin-pot clavulanate]; Naldecon senior [guaifenesin]; Ultram [tramadol]; Allegra [fexofenadine]; Benadryl [diphenhydramine]; Sudafed [pseudoephedrine]; and Zyrtec [cetirizine]   Review of Systems Review of Systems  Constitutional: Negative for chills and fever.  HENT: Negative for congestion and rhinorrhea.   Eyes: Negative for redness and visual disturbance.  Respiratory: Negative for shortness of breath and wheezing.   Cardiovascular: Negative for chest pain and palpitations.  Gastrointestinal: Positive for abdominal pain,  diarrhea, nausea and vomiting.  Genitourinary: Negative for dysuria and urgency.  Musculoskeletal: Negative for arthralgias and myalgias.  Skin: Negative for pallor and wound.  Neurological: Negative for dizziness and headaches.     Physical Exam Updated Vital Signs BP 123/80 (BP Location: Left Arm)   Pulse 77   Temp 98.4 F (36.9 C) (Oral)   Resp 18   LMP 04/20/2016 (Exact Date) Comment: HCG  negative  SpO2 100%    Physical Exam  Constitutional: She is oriented to person, place, and time. She appears well-developed and well-nourished. No distress.  HENT:  Head: Normocephalic and atraumatic.  Eyes: EOM are normal. Pupils are equal, round, and reactive to light.  Neck: Normal range of motion. Neck supple.  Cardiovascular: Normal rate and regular rhythm.  Exam reveals no gallop and no friction rub.   No murmur heard. Pulmonary/Chest: Effort normal. She has no wheezes. She has no rales.  Abdominal: Soft. She exhibits no distension and no mass. There is tenderness (worst to the R flank and RLQ). There is no guarding.  Musculoskeletal: She exhibits no edema or tenderness.  Neurological: She is alert and oriented to person, place, and time.  Skin: Skin is warm and dry. She is not diaphoretic.  Psychiatric: She has a normal mood and affect. Her behavior is normal.  Nursing note and vitals reviewed.    ED Treatments / Results  Labs (all labs ordered are listed, but only abnormal results are displayed) Labs Reviewed  COMPREHENSIVE METABOLIC PANEL - Abnormal; Notable for the following:       Result Value   CO2 21 (*)    Calcium 8.3 (*)    All other components within normal limits  URINALYSIS, ROUTINE W REFLEX MICROSCOPIC (NOT AT [facility]) - Abnormal; Notable for the following:    Color, Urine AMBER (*)    APPearance CLOUDY (*)    Specific Gravity, Urine 1.034 (*)    Bilirubin Urine SMALL (*)    Protein, ur 30 (*)    Leukocytes, UA SMALL (*)    All other components within normal limits  URINE MICROSCOPIC-ADD ON - Abnormal; Notable for the following:    Squamous Epithelial / LPF 0-5 (*)    Bacteria, UA FEW (*)    All other components within normal limits  LIPASE, BLOOD  CBC  I-STAT BETA HCG BLOOD, ED ([facility], [facility], [facility] ONLY)    EKG  EKG Interpretation None       Radiology Ct Renal Stone Study  Result Date: 05/16/2016 CLINICAL DATA:  Right flank pain with nausea and vomiting beginning this  morning. Nephrolithiasis. EXAM: CT ABDOMEN AND PELVIS WITHOUT CONTRAST TECHNIQUE: Multidetector CT imaging of the abdomen and pelvis was performed following the standard protocol without IV contrast. COMPARISON:  01/22/2016 FINDINGS: Lower chest:  No acute findings. Hepatobiliary: No mass visualized on this un-enhanced exam. Gallbladder is unremarkable. Pancreas: No mass or inflammatory process identified on this un-enhanced exam. Spleen: Within normal limits in size. Adrenals/Urinary Tract: 1 mm nonobstructive calculus noted in midpole of right kidney. No evidence of ureteral calculi or hydronephrosis. Urinary bladder is nearly completely collapsed, but no bladder calculi visualized. Stomach/Bowel: No evidence of obstruction, inflammatory process, or abnormal fluid collections. Normal appendix visualized. Vascular/Lymphatic: No pathologically enlarged lymph nodes. No evidence of abdominal aortic aneurysm. Reproductive: No mass or other significant abnormality. Other: None. Musculoskeletal:  No suspicious bone lesions identified. IMPRESSION: 1 mm nonobstructive right renal calculus. No evidence of ureteral calculi, hydronephrosis, or other acute findings. Electronically Signed  By: Myles RosenthalJohn  Stahl M.D.   On: 05/16/2016 13:08    Procedures Procedures (including critical care time)  Medications Ordered in ED Medications  loratadine (CLARITIN) tablet 5 mg (5 mg Oral Given 05/16/16 1324)  morphine 4 MG/ML injection 4 mg (4 mg Intravenous Given 05/16/16 1218)  ondansetron (ZOFRAN) injection 4 mg (4 mg Intravenous Given 05/16/16 1217)  butalbital-acetaminophen-caffeine (FIORICET, ESGIC) 50-325-40 MG per tablet 1 tablet (1 tablet Oral Given 05/16/16 1448)     Initial Impression / Assessment and Plan / ED Course  I have reviewed the triage vital signs and the nursing notes.  Pertinent labs & imaging results that were available during my care of the patient were reviewed by me and considered in my medical decision  making (see chart for details).  Clinical Course    30 yo F With a chief complaint of right lower quadrant tenderness. Patient has a history of multiple presentations with similar complaints. Has had kidney stones in the past. Recently seen by OB and treated for BV. Will obtain a CT stone study.  Negative for acute pathology. Discussed results with patient. Discharge home.   3:48 PM:  I have discussed the diagnosis/risks/treatment options with the patient and family and believe the pt to be eligible for discharge home to follow-up with PCP. We also discussed returning to the ED immediately if new or worsening sx occur. We discussed the sx which are most concerning (e.g., sudden worsening pain, fever, inability to tolerate by mouth) that necessitate immediate return. Medications administered to the patient during their visit and any new prescriptions provided to the patient are listed below.  Medications given during this visit Medications  loratadine (CLARITIN) tablet 5 mg (5 mg Oral Given 05/16/16 1324)  morphine 4 MG/ML injection 4 mg (4 mg Intravenous Given 05/16/16 1218)  ondansetron (ZOFRAN) injection 4 mg (4 mg Intravenous Given 05/16/16 1217)  butalbital-acetaminophen-caffeine (FIORICET, ESGIC) 50-325-40 MG per tablet 1 tablet (1 tablet Oral Given 05/16/16 1448)     The patient appears reasonably screen and/or stabilized for discharge and I doubt any other medical condition or other [facility] requiring further screening, evaluation, or treatment in the ED at this time prior to discharge.    Final Clinical Impressions(s) / ED Diagnoses   Final diagnoses:  Right lower quadrant abdominal pain    New Prescriptions New Prescriptions   No medications on file     Melene PlanDan Neysa Arts, DO 05/16/16 1548

## 2016-05-16 NOTE — ED Notes (Signed)
Patient c/o itching at IV site and left forearm, redness noted to forearm. No signs of infiltration. EDP, Adela LankFloyd made aware of same. Orders as placed.

## 2016-05-17 ENCOUNTER — Emergency Department (HOSPITAL_COMMUNITY)
Admission: EM | Admit: 2016-05-17 | Discharge: 2016-05-17 | Disposition: A | Discharge status: Home / Self Care | Payer: Medicaid Other | Attending: Emergency Medicine | Admitting: Emergency Medicine

## 2016-05-17 ENCOUNTER — Encounter (HOSPITAL_COMMUNITY): Payer: Self-pay

## 2016-05-17 DIAGNOSIS — R519 Headache, unspecified: Secondary | ICD-10-CM

## 2016-05-17 DIAGNOSIS — R0981 Nasal congestion: Secondary | ICD-10-CM

## 2016-05-17 DIAGNOSIS — Z87891 Personal history of nicotine dependence: Secondary | ICD-10-CM | POA: Insufficient documentation

## 2016-05-17 DIAGNOSIS — R51 Headache: Secondary | ICD-10-CM | POA: Diagnosis present

## 2016-05-17 LAB — POC URINE PREG, ED: PREG TEST UR: NEGATIVE

## 2016-05-17 LAB — URINE MICROSCOPIC-ADD ON

## 2016-05-17 LAB — URINALYSIS, ROUTINE W REFLEX MICROSCOPIC
Bilirubin Urine: NEGATIVE
Glucose, UA: NEGATIVE mg/dL
KETONES UR: NEGATIVE mg/dL
LEUKOCYTES UA: NEGATIVE
Nitrite: NEGATIVE
PROTEIN: 30 mg/dL — AB
Specific Gravity, Urine: 1.037 — ABNORMAL HIGH (ref 1.005–1.030)
pH: 5.5 (ref 5.0–8.0)

## 2016-05-17 MED ORDER — ONDANSETRON HCL 4 MG PO TABS: 4.0000 mg | ORAL_TABLET | Freq: Four times a day (QID) | ORAL | 0 refills | Status: DC

## 2016-05-17 MED ORDER — ACETAMINOPHEN 500 MG PO TABS: 500.0000 mg | ORAL_TABLET | Freq: Four times a day (QID) | ORAL | 0 refills | Status: DC | PRN

## 2016-05-17 MED ORDER — SALINE SPRAY 0.65 % NA SOLN: 1.0000 | NASAL | 0 refills | Status: DC | PRN

## 2016-05-17 MED ORDER — METOCLOPRAMIDE HCL 10 MG PO TABS
10.0000 mg | ORAL_TABLET | Freq: Once | ORAL | Status: AC
  Administered 2016-05-17: 10 mg via ORAL
  Filled 2016-05-17: qty 1

## 2016-05-17 MED ORDER — SALINE SPRAY 0.65 % NA SOLN
1.0000 | Freq: Once | NASAL | Status: AC
  Administered 2016-05-17: 1 via NASAL
  Filled 2016-05-17: qty 44

## 2016-05-17 MED ORDER — ACETAMINOPHEN 500 MG PO TABS
1000.0000 mg | ORAL_TABLET | Freq: Once | ORAL | Status: AC
  Administered 2016-05-17: 1000 mg via ORAL
  Filled 2016-05-17: qty 2

## 2016-05-17 NOTE — ED Provider Notes (Signed)
[facility]-EMERGENCY DEPT Provider Note   CSN: [id number] Arrival date & time: 05/17/16  0746  History   Chief Complaint Chief Complaint  Patient presents with  . Facial Pain   HPI Christine Orozco is a 30 y.o. female with history of seasonal allergies who presents with 2-3 days of sinus pressure, pressure like sensation in her bilateral frontal sinuses, nausea. She attempted to take ibuprofen at home without improvement in her symptoms. She endorsed fever 2 days ago however none currently. She is able tolerate oral intake. There are no aggravating or alleviating symptoms.  The history is provided by the patient. No language interpreter was used.   Past Medical History:  Diagnosis Date  . Hepatitis B antibody positive   . Vaginal Pap smear, abnormal     Patient Active Problem List   Diagnosis Date Noted  . 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 CERVICAL BIOPSY  W/ LOOP ELECTRODE EXCISION    . extraction of wisdom teeth      OB History    Gravida Para Term Preterm AB Living   2 1 1   1 1    SAB TAB Ectopic Multiple Live Births   1               Home Medications    Prior to Admission medications   Medication Sig Start Date End Date Taking? Authorizing Provider  ibuprofen (ADVIL,MOTRIN) 200 MG tablet Take 400 mg by mouth every 6 (six) hours as needed for moderate pain or cramping.   Yes Historical Provider, MD  norgestimate-ethinyl estradiol (ORTHO-CYCLEN,SPRINTEC,PREVIFEM) 0.25-35 MG-MCG tablet Take 1 tablet by mouth daily. 03/16/16  Yes Judeth HornErin Lawrence, NP  OVER THE COUNTER MEDICATION Take 1 tablet by mouth as needed (for congestion). Pseudophed PE   Yes Historical Provider, MD  acetaminophen (TYLENOL) 500 MG tablet Take 1 tablet (500 mg total) by mouth every 6 (six) hours as needed. 05/17/16   Dan HumphreysMichael Rhyanna Sorce, MD  ondansetron (ZOFRAN) 4 MG tablet Take 1 tablet (4 mg total) by mouth every 6 (six) hours.  05/17/16   Dan HumphreysMichael Camarie Mctigue, MD  sodium chloride (OCEAN) 0.65 % SOLN nasal spray Place 1 spray into both nostrils as needed for congestion. 05/17/16   Dan HumphreysMichael Montreal Steidle, MD    Family History Family History  Problem Relation Age of Onset  . Adopted: Yes    Social History Social History  Substance Use Topics  . Smoking status: Former Smoker    Packs/day: 0.25  . Smokeless tobacco: Never Used  . Alcohol use Yes     Comment: rare     Allergies   Beef-derived products; Toradol [ketorolac tromethamine]; Ultram [tramadol]; Allegra [fexofenadine]; Augmentin [amoxicillin-pot clavulanate]; Benadryl [diphenhydramine]; Naldecon senior [guaifenesin]; Sudafed [pseudoephedrine]; and Zyrtec [cetirizine]   Review of Systems Review of Systems  Constitutional: Negative for chills and fever.  HENT: Positive for congestion and sinus pressure. Negative for ear pain, facial swelling and sore throat.   Eyes: Negative for pain and visual disturbance.  Respiratory: Negative for cough and shortness of breath.   Cardiovascular: Negative for chest pain and palpitations.  Gastrointestinal: Negative for abdominal pain and vomiting.  Genitourinary: Negative for dysuria and hematuria.  Musculoskeletal: Negative for arthralgias and back pain.  Skin: Negative for color change and rash.  Neurological: Negative for seizures and syncope.  All other systems reviewed and are negative.    Physical Exam Updated Vital Signs BP 116/55   Pulse Marland Kitchen(!)  55   Temp 98.1 F (36.7 C) (Oral)   Resp 18   Ht 5\' 3"  (1.6 m)   Wt 59 kg   LMP 04/20/2016 (Exact Date) Comment: HCG  negative  SpO2 100%   BMI 23.03 kg/m   Physical Exam  Constitutional: She appears well-developed and well-nourished. No distress.  HENT:  Head: Normocephalic and atraumatic.    Eyes: Conjunctivae are normal.  Neck: Neck supple.  Cardiovascular: Normal rate and regular rhythm.   No murmur heard. Pulmonary/Chest: Effort normal and breath sounds  normal. No respiratory distress.  Abdominal: Soft. There is no tenderness.  Musculoskeletal: She exhibits no edema.  Neurological: She is alert.  Skin: Skin is warm and dry.  Psychiatric: She has a normal mood and affect.  Nursing note and vitals reviewed.   ED Treatments / Results  Labs (all labs ordered are listed, but only abnormal results are displayed) Labs Reviewed  URINALYSIS, ROUTINE W REFLEX MICROSCOPIC (NOT AT [facility]) - Abnormal; Notable for the following:       Result Value   Color, Urine AMBER (*)    APPearance CLOUDY (*)    Specific Gravity, Urine 1.037 (*)    Hgb urine dipstick SMALL (*)    Protein, ur 30 (*)    All other components within normal limits  URINE MICROSCOPIC-ADD ON - Abnormal; Notable for the following:    Squamous Epithelial / LPF 0-5 (*)    Bacteria, UA FEW (*)    Crystals URIC ACID CRYSTALS (*)    All other components within normal limits  POC URINE PREG, ED    EKG  EKG Interpretation None       Radiology Ct Renal Stone Study  Result Date: 05/16/2016 CLINICAL DATA:  Right flank pain with nausea and vomiting beginning this morning. Nephrolithiasis. EXAM: CT ABDOMEN AND PELVIS WITHOUT CONTRAST TECHNIQUE: Multidetector CT imaging of the abdomen and pelvis was performed following the standard protocol without IV contrast. COMPARISON:  01/22/2016 FINDINGS: Lower chest:  No acute findings. Hepatobiliary: No mass visualized on this un-enhanced exam. Gallbladder is unremarkable. Pancreas: No mass or inflammatory process identified on this un-enhanced exam. Spleen: Within normal limits in size. Adrenals/Urinary Tract: 1 mm nonobstructive calculus noted in midpole of right kidney. No evidence of ureteral calculi or hydronephrosis. Urinary bladder is nearly completely collapsed, but no bladder calculi visualized. Stomach/Bowel: No evidence of obstruction, inflammatory process, or abnormal fluid collections. Normal appendix visualized. Vascular/Lymphatic: No  pathologically enlarged lymph nodes. No evidence of abdominal aortic aneurysm. Reproductive: No mass or other significant abnormality. Other: None. Musculoskeletal:  No suspicious bone lesions identified. IMPRESSION: 1 mm nonobstructive right renal calculus. No evidence of ureteral calculi, hydronephrosis, or other acute findings. Electronically Signed   By: Myles Rosenthal M.D.   On: 05/16/2016 13:08    Procedures Procedures (including critical care time)  Medications Ordered in ED Medications  sodium chloride (OCEAN) 0.65 % nasal spray 1 spray (1 spray Each Nare Given 05/17/16 0828)  metoCLOPramide (REGLAN) tablet 10 mg (10 mg Oral Given 05/17/16 0827)  acetaminophen (TYLENOL) tablet 1,000 mg (1,000 mg Oral Given 05/17/16 0827)     Initial Impression / Assessment and Plan / ED Course  I have reviewed the triage vital signs and the nursing notes.  Pertinent labs & imaging results that were available during my care of the patient were reviewed by me and considered in my medical decision making (see chart for details).  Clinical Course    Patient with sinus pressure, mild frontal  headache, nausea 2-3 days. She is well-appearing, vital signs unremarkable. No tenderness palpation of his sinuses. Rest of HEENT exam unremarkable.  Differential diagnosis includes viral congestion versus allergic rhinitis versus sinusitis. Due to short duration of symptoms, will treat symptomatically with Reglan for headache/nausea, nasal saline spray for sinus congestion. Will check urinalysis and pregnancy test.   No nuchal rigidity, fever to suggest meningitis, slow onset of headache, not consistent with subarachnoid hemorrhage.  9:20 AM: Patient reevaluated. Improved headache, nausea. Urine with small hemoglobin. Reviewed her CT images from yesterday, 1 mm nonobstructing stone seen. Likely cause of patient's right flank pain. Urinalysis without signs of infection, negative pregnancy test. Will discharge patient  with Zofran, Tylenol/ibuprofen, normal saline spray.  Work note provided at [employer]'s request. Refer to PCP for long-term establishment of care.  Discussed case with my attending, Dr. Jodi MourningZavitz.   Final Clinical Impressions(s) / ED Diagnoses   Final diagnoses:  Facial pain  Sinus congestion    New Prescriptions New Prescriptions   ACETAMINOPHEN (TYLENOL) 500 MG TABLET    Take 1 tablet (500 mg total) by mouth every 6 (six) hours as needed.   ONDANSETRON (ZOFRAN) 4 MG TABLET    Take 1 tablet (4 mg total) by mouth every 6 (six) hours.   SODIUM CHLORIDE (OCEAN) 0.65 % SOLN NASAL SPRAY    Place 1 spray into both nostrils as needed for congestion.     Dan HumphreysMichael Rommie Dunn, MD 05/17/16 [id number]    Blane OharaJoshua Zavitz, MD 05/17/16 [phone]

## 2016-05-17 NOTE — ED Notes (Signed)
Dr. Zavits at bedside   

## 2016-05-17 NOTE — ED Notes (Signed)
Pt ambulated to restroom without difficulty

## 2016-05-17 NOTE — ED Notes (Signed)
PT states that she took "sudafed PE this morning before work, I just started taking it last night, I only took about 3 pills of it".

## 2016-05-17 NOTE — ED Triage Notes (Signed)
Per pt: Pt states that "I have been nauseated and vomiting for the past two days now, I am pretty sure it's sinus or allergies. I went to work today [facility] day and my boss sent me back home." Pt states that she also has a headache "[facility] kept trying to treat me for a migraine and I kept yelling 'It's not a migraine and they sent me home with nothing."

## 2016-07-01 ENCOUNTER — Encounter (HOSPITAL_COMMUNITY): Payer: Self-pay

## 2016-07-01 ENCOUNTER — Inpatient Hospital Stay (HOSPITAL_COMMUNITY)
Admission: AD | Admit: 2016-07-01 | Discharge: 2016-07-01 | Disposition: A | Discharge status: Home / Self Care | Payer: Medicaid Other | Source: Ambulatory Visit | Attending: Obstetrics and Gynecology | Admitting: Obstetrics and Gynecology

## 2016-07-01 DIAGNOSIS — Z87891 Personal history of nicotine dependence: Secondary | ICD-10-CM | POA: Diagnosis not present

## 2016-07-01 DIAGNOSIS — Z880 Allergy status to penicillin: Secondary | ICD-10-CM | POA: Insufficient documentation

## 2016-07-01 DIAGNOSIS — R109 Unspecified abdominal pain: Secondary | ICD-10-CM | POA: Diagnosis present

## 2016-07-01 DIAGNOSIS — N946 Dysmenorrhea, unspecified: Secondary | ICD-10-CM | POA: Insufficient documentation

## 2016-07-01 LAB — URINALYSIS, ROUTINE W REFLEX MICROSCOPIC
Bilirubin Urine: NEGATIVE
GLUCOSE, UA: NEGATIVE mg/dL
KETONES UR: NEGATIVE mg/dL
LEUKOCYTES UA: NEGATIVE
NITRITE: NEGATIVE
PROTEIN: NEGATIVE mg/dL
Specific Gravity, Urine: 1.015 (ref 1.005–1.030)
pH: 7 (ref 5.0–8.0)

## 2016-07-01 LAB — URINE MICROSCOPIC-ADD ON

## 2016-07-01 LAB — WET PREP, GENITAL
Clue Cells Wet Prep HPF POC: NONE SEEN
Sperm: NONE SEEN
Trich, Wet Prep: NONE SEEN
WBC WET PREP: NONE SEEN
YEAST WET PREP: NONE SEEN

## 2016-07-01 LAB — POCT PREGNANCY, URINE: Preg Test, Ur: NEGATIVE

## 2016-07-01 MED ORDER — IBUPROFEN 200 MG PO TABS: 800.0000 mg | ORAL_TABLET | Freq: Three times a day (TID) | ORAL | 0 refills | Status: DC | PRN

## 2016-07-01 MED ORDER — NAPROXEN SODIUM 550 MG PO TABS
550.0000 mg | ORAL_TABLET | Freq: Once | ORAL | Status: AC
  Administered 2016-07-01: 550 mg via ORAL
  Filled 2016-07-01: qty 1

## 2016-07-01 NOTE — MAU Provider Note (Signed)
History     CSN: [id number]  Arrival date and time: 07/01/16 1240   None     Chief Complaint  Patient presents with  . Nausea  . Abdominal Pain  . Vaginal Bleeding   Non-pregnant female c/o severe lower abdominal cramping this am. She took Ibuprofen 800 mg and notes some improvement. She started menses yesterday. She reports average flow now but was heavier earlier. She also reports lifting heavy boxes at work this am. No fevers. No urinary sx. LMP 9/11/11/15. She is contracepting with OCPs.   Past Medical History:  Diagnosis Date  . Hepatitis B antibody positive   . Vaginal Pap smear, abnormal     Past Surgical History:  Procedure Laterality Date  . CERVICAL BIOPSY  W/ LOOP ELECTRODE EXCISION    . extraction of wisdom teeth      Family History  Problem Relation Age of Onset  . Adopted: Yes    Social History  Substance Use Topics  . Smoking status: Former Smoker    Packs/day: 0.25  . Smokeless tobacco: Never Used  . Alcohol use Yes     Comment: rare    Allergies:  Allergies  Allergen Reactions  . Beef-Derived Products Nausea And Vomiting and Other (See Comments)    Activity vomits     . Toradol [Ketorolac Tromethamine] Anaphylaxis  . Ultram [Tramadol] Other (See Comments)    Hallucinate and heart stop  . Allegra [Fexofenadine] Palpitations and Other (See Comments)    hyperactive  . Augmentin [Amoxicillin-Pot Clavulanate] Other (See Comments)    Childhood, pt states she can take penicillin Has patient had a PCN reaction causing immediate rash, facial/tongue/throat swelling, SOB or lightheadedness with hypotension: No Has patient had a PCN reaction causing severe rash involving mucus membranes or skin necrosis: No Has patient had a PCN reaction that required hospitalization No Has patient had a PCN reaction occurring within the last 10 years: No If all of the above answers are "NO", then may proceed with Cephalosporin use.   . Benadryl [Diphenhydramine]  Hives and Palpitations  . Naldecon Senior [Guaifenesin] Nausea And Vomiting and Other (See Comments)    Hives   . Sudafed [Pseudoephedrine] Palpitations and Other (See Comments)    hyperactivity   . Zyrtec [Cetirizine] Palpitations and Other (See Comments)    Hyperactive     Prescriptions Prior to Admission  Medication Sig Dispense Refill Last Dose  . acetaminophen (TYLENOL) 500 MG tablet Take 1 tablet (500 mg total) by mouth every 6 (six) hours as needed. 30 tablet 0 Past Month at Unknown time  . ibuprofen (ADVIL,MOTRIN) 200 MG tablet Take 400 mg by mouth every 6 (six) hours as needed for moderate pain or cramping.   Past Month at Unknown time  . norgestimate-ethinyl estradiol (ORTHO-CYCLEN,SPRINTEC,PREVIFEM) 0.25-35 MG-MCG tablet Take 1 tablet by mouth daily. 1 Package 11 06/30/2016 at Unknown time  . OVER THE COUNTER MEDICATION Take 1 tablet by mouth as needed (for congestion). Pseudophed PE   Past Month at Unknown time  . sodium chloride (OCEAN) 0.65 % SOLN nasal spray Place 1 spray into both nostrils as needed for congestion. 1 Bottle 0 Past Month at Unknown time  . ondansetron (ZOFRAN) 4 MG tablet Take 1 tablet (4 mg total) by mouth every 6 (six) hours. (Patient not taking: Reported on 07/01/2016) 12 tablet 0 Not Taking at Unknown time    Review of Systems  Constitutional: Negative.   Gastrointestinal: Positive for abdominal pain.  Genitourinary: Negative.  Physical Exam   Blood pressure 125/85, pulse 82, temperature 98.2 F (36.8 C), resp. rate 18, weight 59 kg (130 lb).  Physical Exam  Constitutional: She is oriented to person, place, and time. She appears well-developed and well-nourished.  HENT:  Head: Normocephalic and atraumatic.  Eyes: Pupils are equal, round, and reactive to light.  Neck: Normal range of motion. Neck supple.  Cardiovascular: Normal rate.   Respiratory: Effort normal.  GI: Soft. She exhibits no distension and no mass. There is no tenderness. There is  no rebound and no guarding.  Genitourinary:  Genitourinary Comments: External: no lesions Vagina: rugated, parous, scant bloody discharge Uterus: non enlarged, anteverted, non tender, no CMT Adnexae: no masses, no tenderness left, mild tenderness right   Musculoskeletal: Normal range of motion.  Neurological: She is alert and oriented to person, place, and time.  Skin: Skin is warm and dry.  Psychiatric: She has a normal mood and affect.   Results for orders placed or performed during the hospital encounter of 07/01/16 (from the past 24 hour(s))  Urinalysis, Routine w reflex microscopic (not [facility])     Status: Abnormal   Collection Time: 07/01/16 12:50 PM  Result Value Ref Range   Color, Urine YELLOW YELLOW   APPearance CLEAR CLEAR   Specific Gravity, Urine 1.015 1.005 - 1.030   pH 7.0 5.0 - 8.0   Glucose, UA NEGATIVE NEGATIVE mg/dL   Hgb urine dipstick TRACE (A) NEGATIVE   Bilirubin Urine NEGATIVE NEGATIVE   Ketones, ur NEGATIVE NEGATIVE mg/dL   Protein, ur NEGATIVE NEGATIVE mg/dL   Nitrite NEGATIVE NEGATIVE   Leukocytes, UA NEGATIVE NEGATIVE  Urine microscopic-add on     Status: Abnormal   Collection Time: 07/01/16 12:50 PM  Result Value Ref Range   Squamous Epithelial / LPF 0-5 (A) NONE SEEN   WBC, UA 0-5 0 - 5 WBC/hpf   RBC / HPF 0-5 0 - 5 RBC/hpf   Bacteria, UA MANY (A) NONE SEEN  Pregnancy, urine POC     Status: None   Collection Time: 07/01/16 12:59 PM  Result Value Ref Range   Preg Test, Ur NEGATIVE NEGATIVE    MAU Course  Procedures Anaprox 550 mg po x1  MDM Labs ordered and reviewed. No evidence of acute pelvic process. Pain rated zero after med. Stable for discharge home.   Assessment and Plan   1. Dysmenorrhea    Discharge home Follow up in [facility] prn    Medication List    STOP taking these medications   ondansetron 4 MG tablet Commonly known as:  ZOFRAN     TAKE these medications   acetaminophen 500 MG tablet Commonly known as:   TYLENOL Take 1 tablet (500 mg total) by mouth every 6 (six) hours as needed.   ibuprofen 200 MG tablet Commonly known as:  ADVIL,MOTRIN Take 4 tablets (800 mg total) by mouth every 8 (eight) hours as needed for moderate pain or cramping. What changed:  how much to take  when to take this   norgestimate-ethinyl estradiol 0.25-35 MG-MCG tablet Commonly known as:  ORTHO-CYCLEN,SPRINTEC,PREVIFEM Take 1 tablet by mouth daily.   OVER THE COUNTER MEDICATION Take 1 tablet by mouth as needed (for congestion). Pseudophed PE   sodium chloride 0.65 % Soln nasal spray Commonly known as:  OCEAN Place 1 spray into both nostrils as needed for congestion.       Donette LarryMelanie Mayola Mcbain, CNM  07/01/2016, 1:58 PM

## 2016-07-01 NOTE — MAU Note (Addendum)
Pt presents to MAU with complaints of vaginal bleeding, lower abdominal pain, nausea. Pt states she is taking oral birth control pills and had a LEEP surgery in July. Pt did take ibuprofen at 1030

## 2016-07-01 NOTE — Progress Notes (Signed)
Patient also complains of left ear pain.

## 2016-07-01 NOTE — Discharge Instructions (Signed)

## 2016-07-04 LAB — GC/CHLAMYDIA PROBE AMP (~~LOC~~) NOT AT ARMC
CHLAMYDIA, DNA PROBE: NEGATIVE
NEISSERIA GONORRHEA: NEGATIVE

## 2016-08-19 ENCOUNTER — Emergency Department (HOSPITAL_COMMUNITY)
Admission: EM | Admit: 2016-08-19 | Discharge: 2016-08-19 | Disposition: A | Discharge status: Home / Self Care | Payer: Medicaid Other | Attending: Emergency Medicine | Admitting: Emergency Medicine

## 2016-08-19 ENCOUNTER — Encounter (HOSPITAL_COMMUNITY): Payer: Self-pay | Admitting: Emergency Medicine

## 2016-08-19 DIAGNOSIS — R109 Unspecified abdominal pain: Secondary | ICD-10-CM | POA: Diagnosis present

## 2016-08-19 DIAGNOSIS — Z79899 Other long term (current) drug therapy: Secondary | ICD-10-CM | POA: Diagnosis not present

## 2016-08-19 DIAGNOSIS — Z87891 Personal history of nicotine dependence: Secondary | ICD-10-CM | POA: Diagnosis not present

## 2016-08-19 DIAGNOSIS — K529 Noninfective gastroenteritis and colitis, unspecified: Secondary | ICD-10-CM | POA: Insufficient documentation

## 2016-08-19 LAB — COMPREHENSIVE METABOLIC PANEL
ALK PHOS: 64 U/L (ref 38–126)
ALT: 13 U/L — ABNORMAL LOW (ref 14–54)
ANION GAP: 8 (ref 5–15)
AST: 21 U/L (ref 15–41)
Albumin: 4.2 g/dL (ref 3.5–5.0)
BILIRUBIN TOTAL: 0.7 mg/dL (ref 0.3–1.2)
BUN: 18 mg/dL (ref 6–20)
CALCIUM: 9.1 mg/dL (ref 8.9–10.3)
CO2: 25 mmol/L (ref 22–32)
Chloride: 107 mmol/L (ref 101–111)
Creatinine, Ser: 0.92 mg/dL (ref 0.44–1.00)
GFR calc non Af Amer: >60 mL/min (ref >60)
Glucose, Bld: 77 mg/dL (ref 65–99)
Potassium: 3.8 mmol/L (ref 3.5–5.1)
SODIUM: 140 mmol/L (ref 135–145)
TOTAL PROTEIN: 7.1 g/dL (ref 6.5–8.1)

## 2016-08-19 LAB — URINALYSIS, ROUTINE W REFLEX MICROSCOPIC
Bilirubin Urine: NEGATIVE
Glucose, UA: NEGATIVE mg/dL
Hgb urine dipstick: NEGATIVE
Ketones, ur: NEGATIVE mg/dL
Leukocytes, UA: NEGATIVE
NITRITE: NEGATIVE
Protein, ur: NEGATIVE mg/dL
SPECIFIC GRAVITY, URINE: 1.024 (ref 1.005–1.030)
pH: 7 (ref 5.0–8.0)

## 2016-08-19 LAB — CBC
HCT: 42 % (ref 36.0–46.0)
HEMOGLOBIN: 14.6 g/dL (ref 12.0–15.0)
MCH: 31 pg (ref 26.0–34.0)
MCHC: 34.8 g/dL (ref 30.0–36.0)
MCV: 89.2 fL (ref 78.0–100.0)
Platelets: 271 10*3/uL (ref 150–400)
RBC: 4.71 MIL/uL (ref 3.87–5.11)
RDW: 13.2 % (ref 11.5–15.5)
WBC: 8.4 10*3/uL (ref 4.0–10.5)

## 2016-08-19 LAB — PREGNANCY, URINE: PREG TEST UR: NEGATIVE

## 2016-08-19 LAB — LIPASE, BLOOD: Lipase: 21 U/L (ref 11–51)

## 2016-08-19 MED ORDER — SODIUM CHLORIDE 0.9 % IV BOLUS (SEPSIS)
1000.0000 mL | Freq: Once | INTRAVENOUS | Status: AC
  Administered 2016-08-19: 1000 mL via INTRAVENOUS

## 2016-08-19 MED ORDER — ONDANSETRON HCL 4 MG/2ML IJ SOLN
4.0000 mg | Freq: Once | INTRAMUSCULAR | Status: AC
  Administered 2016-08-19: 4 mg via INTRAVENOUS
  Filled 2016-08-19: qty 2

## 2016-08-19 MED ORDER — PROMETHAZINE HCL 25 MG PO TABS: 25.0000 mg | ORAL_TABLET | Freq: Three times a day (TID) | ORAL | 0 refills | Status: DC | PRN

## 2016-08-19 NOTE — ED Notes (Signed)
ED Provider at bedside. 

## 2016-08-19 NOTE — ED Triage Notes (Signed)
Per pt, states was at work and all of a sudden became nauseated and lower abdominal pain-states son had GI bug

## 2016-08-19 NOTE — ED Notes (Signed)
Patient given sprite.

## 2016-08-19 NOTE — Discharge Instructions (Signed)
Return here as needed.  Follow-up with a primary care doctor.  Slowly increase her fluid intake, rest as much as possible

## 2016-08-19 NOTE — ED Provider Notes (Signed)
[facility]-EMERGENCY DEPT Provider Note   CSN: [id number] Arrival date & time: 08/19/16  1248     History   Chief Complaint Chief Complaint  Patient presents with  . Abdominal Pain    HPI Christine Orozco is a 30 y.o. female.  HPI Patient presents to the emergency department with diarrhea and abdominal discomfort that started while she was at work today.  The patient states that her son has had a GI illness over the last 2 days, similar to this.  Patient states that she did not take any medications prior to arrival.  The patient denies chest pain, shortness of breath, headache,blurred vision, neck pain, fever, cough, weakness, numbness, dizziness, anorexia, edema, abdominal pain, nausea, vomiting, rash, back pain, dysuria, hematemesis, bloody stool, near syncope, or syncope. Past Medical History:  Diagnosis Date  . Hepatitis B antibody positive   . Vaginal Pap smear, abnormal     Patient Active Problem List   Diagnosis Date Noted  . 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 CERVICAL BIOPSY  W/ LOOP ELECTRODE EXCISION    . extraction of wisdom teeth      OB History    Gravida Para Term Preterm AB Living   2 1 1   1 1    SAB TAB Ectopic Multiple Live Births   1               Home Medications    Prior to Admission medications   Medication Sig Start Date End Date Taking? Authorizing Provider  ibuprofen (ADVIL,MOTRIN) 200 MG tablet Take 4 tablets (800 mg total) by mouth every 8 (eight) hours as needed for moderate pain or cramping. 07/01/16  Yes Donette LarryMelanie Bhambri, CNM  norgestimate-ethinyl estradiol (ORTHO-CYCLEN,SPRINTEC,PREVIFEM) 0.25-35 MG-MCG tablet Take 1 tablet by mouth daily. 03/16/16  Yes Judeth HornErin Lawrence, NP  acetaminophen (TYLENOL) 500 MG tablet Take 1 tablet (500 mg total) by mouth every 6 (six) hours as needed. Patient not taking: Reported on 08/19/2016 05/17/16   Dan HumphreysMichael Irick, MD  sodium chloride  (OCEAN) 0.65 % SOLN nasal spray Place 1 spray into both nostrils as needed for congestion. Patient not taking: Reported on 08/19/2016 05/17/16   Dan HumphreysMichael Irick, MD    Family History Family History  Problem Relation Age of Onset  . Adopted: Yes    Social History Social History  Substance Use Topics  . Smoking status: Former Smoker    Packs/day: 0.25  . Smokeless tobacco: Never Used  . Alcohol use Yes     Comment: rare     Allergies   Beef-derived products; Ultram [tramadol]; Allegra [fexofenadine]; Augmentin [amoxicillin-pot clavulanate]; Benadryl [diphenhydramine]; Naldecon senior [guaifenesin]; Sudafed [pseudoephedrine]; and Zyrtec [cetirizine]   Review of Systems Review of Systems All other systems negative except as documented in the HPI. All pertinent positives and negatives as reviewed in the HPI.  Physical Exam Updated Vital Signs BP 139/82 (BP Location: Right Arm)   Pulse 93   Temp 98.1 F (36.7 C) (Oral)   Resp 18   LMP 07/30/2016   SpO2 100%   Physical Exam  Constitutional: She is oriented to person, place, and time. She appears well-developed and well-nourished. No distress.  HENT:  Head: Normocephalic and atraumatic.  Mouth/Throat: Oropharynx is clear and moist.  Eyes: Pupils are equal, round, and reactive to light.  Neck: Normal range of motion. Neck supple.  Cardiovascular: Normal rate, regular rhythm and normal heart sounds.  Exam reveals no  gallop and no friction rub.   No murmur heard. Pulmonary/Chest: Effort normal and breath sounds normal. No respiratory distress. She has no wheezes.  Abdominal: Soft. Bowel sounds are normal. She exhibits no distension. There is no tenderness. There is no rebound and no guarding.  Neurological: She is alert and oriented to person, place, and time. She exhibits normal muscle tone. Coordination normal.  Skin: Skin is warm and dry. No rash noted. No erythema.  Psychiatric: She has a normal mood and affect. Her behavior  is normal.  Nursing note and vitals reviewed.    ED Treatments / Results  Labs (all labs ordered are listed, but only abnormal results are displayed) Labs Reviewed  COMPREHENSIVE METABOLIC PANEL - Abnormal; Notable for the following:       Result Value   ALT 13 (*)    All other components within normal limits  URINALYSIS, ROUTINE W REFLEX MICROSCOPIC (NOT AT [facility]) - Abnormal; Notable for the following:    APPearance CLOUDY (*)    All other components within normal limits  LIPASE, BLOOD  CBC  PREGNANCY, URINE    EKG  EKG Interpretation None       Radiology No results found.  Procedures Procedures (including critical care time)  Medications Ordered in ED Medications  sodium chloride 0.9 % bolus 1,000 mL (0 mLs Intravenous Stopped 08/19/16 1433)  ondansetron (ZOFRAN) injection 4 mg (4 mg Intravenous Given 08/19/16 1328)     Initial Impression / Assessment and Plan / ED Course  I have reviewed the triage vital signs and the nursing notes.  Pertinent labs & imaging results that were available during my care of the patient were reviewed by me and considered in my medical decision making (see chart for details).  Clinical Course     Patient be treated for gastroenteritis.  Told to return here as needed.  Patient recently plan.  All questions are answered.  She has tolerated oral fluids.  Patient also had me look at her nipple ring, which she felt might be infected, but I do not see any signs of swelling, redness or drainage from the area.  Advised to use warm compresses around the area.  Keep the area clean and dry.  Patient agrees the plan and all questions were answered  Final Clinical Impressions(s) / ED Diagnoses   Final diagnoses:  None    New Prescriptions New Prescriptions   No medications on file     Charlestine NightChristopher Maxamillian Tienda, PA-C 08/19/16 1522    Gerhard Munchobert Lockwood, MD 08/20/16 2123

## 2016-10-31 ENCOUNTER — Encounter (HOSPITAL_COMMUNITY): Payer: Self-pay | Admitting: Emergency Medicine

## 2016-10-31 ENCOUNTER — Emergency Department (HOSPITAL_COMMUNITY)
Admission: EM | Admit: 2016-10-31 | Discharge: 2016-10-31 | Disposition: A | Discharge status: Home / Self Care | Payer: Medicaid Other | Attending: Emergency Medicine | Admitting: Emergency Medicine

## 2016-10-31 DIAGNOSIS — R6889 Other general symptoms and signs: Secondary | ICD-10-CM

## 2016-10-31 DIAGNOSIS — R112 Nausea with vomiting, unspecified: Secondary | ICD-10-CM | POA: Insufficient documentation

## 2016-10-31 DIAGNOSIS — R05 Cough: Secondary | ICD-10-CM | POA: Insufficient documentation

## 2016-10-31 DIAGNOSIS — Z87891 Personal history of nicotine dependence: Secondary | ICD-10-CM | POA: Insufficient documentation

## 2016-10-31 LAB — COMPREHENSIVE METABOLIC PANEL
ALK PHOS: 56 U/L (ref 38–126)
ALT: 15 U/L (ref 14–54)
AST: 20 U/L (ref 15–41)
Albumin: 4.1 g/dL (ref 3.5–5.0)
Anion gap: 7 (ref 5–15)
BUN: 10 mg/dL (ref 6–20)
CALCIUM: 9.2 mg/dL (ref 8.9–10.3)
CO2: 26 mmol/L (ref 22–32)
CREATININE: 0.84 mg/dL (ref 0.44–1.00)
Chloride: 107 mmol/L (ref 101–111)
Glucose, Bld: 109 mg/dL — ABNORMAL HIGH (ref 65–99)
Potassium: 4.5 mmol/L (ref 3.5–5.1)
SODIUM: 140 mmol/L (ref 135–145)
Total Bilirubin: 0.5 mg/dL (ref 0.3–1.2)
Total Protein: 7.4 g/dL (ref 6.5–8.1)

## 2016-10-31 LAB — URINALYSIS, ROUTINE W REFLEX MICROSCOPIC
Bilirubin Urine: NEGATIVE
GLUCOSE, UA: NEGATIVE mg/dL
Ketones, ur: 5 mg/dL — AB
Leukocytes, UA: NEGATIVE
Nitrite: NEGATIVE
PH: 5 (ref 5.0–8.0)
Protein, ur: NEGATIVE mg/dL
SPECIFIC GRAVITY, URINE: 1.024 (ref 1.005–1.030)

## 2016-10-31 LAB — I-STAT BETA HCG BLOOD, ED (MC, WL, AP ONLY): I-stat hCG, quantitative: <5 m[IU]/mL (ref <5)

## 2016-10-31 LAB — CBC
HCT: 44.6 % (ref 36.0–46.0)
Hemoglobin: 14.8 g/dL (ref 12.0–15.0)
MCH: 30 pg (ref 26.0–34.0)
MCHC: 33.2 g/dL (ref 30.0–36.0)
MCV: 90.5 fL (ref 78.0–100.0)
PLATELETS: 240 10*3/uL (ref 150–400)
RBC: 4.93 MIL/uL (ref 3.87–5.11)
RDW: 13.1 % (ref 11.5–15.5)
WBC: 11.4 10*3/uL — ABNORMAL HIGH (ref 4.0–10.5)

## 2016-10-31 LAB — LIPASE, BLOOD: Lipase: 16 U/L (ref 11–51)

## 2016-10-31 MED ORDER — OSELTAMIVIR PHOSPHATE 75 MG PO CAPS: 75.0000 mg | ORAL_CAPSULE | Freq: Two times a day (BID) | ORAL | 0 refills | Status: DC

## 2016-10-31 MED ORDER — ONDANSETRON HCL 4 MG PO TABS: 4.0000 mg | ORAL_TABLET | Freq: Four times a day (QID) | ORAL | 0 refills | Status: DC

## 2016-10-31 NOTE — Discharge Instructions (Signed)
Please read and follow all provided instructions.  Your diagnoses today include:  1. Flu-like symptoms     Tests performed today include: TESTS Vital signs. See below for your results today.   Medications prescribed:   Take any prescribed medications only as directed.  Home care instructions:  Follow any educational materials contained in this packet. Please continue drinking plenty of fluids. Use over-the-counter cold and flu medications as needed as directed on packaging for symptom relief. You may also use ibuprofen or tylenol as directed on packaging for pain or fever.   BE VERY CAREFUL not to take multiple medicines containing Tylenol (also called acetaminophen). Doing so can lead to an overdose which can damage your liver and cause liver failure and possibly death.   Follow-up instructions: Please follow-up with your primary care provider in the next 3 days for further evaluation of your symptoms.   Return instructions:  Please return to the Emergency Department if you experience worsening symptoms. Please return if you have a high fever greater than 101 degrees not controlled with over-the-counter medications, persistent vomiting and cannot keep down fluids, or worsening trouble breathing. Please return if you have any other emergent concerns.  Additional Information:  Your vital signs today were: BP 130/88    Pulse 88    Temp 97.9 F (36.6 C)    Resp 16    Ht 5\' 3"  (1.6 m)    Wt 63.5 kg    LMP 10/29/2016    SpO2 99%    BMI 24.80 kg/m  If your blood pressure (BP) was elevated above 135/85 this visit, please have this repeated by your doctor within one month.

## 2016-10-31 NOTE — ED Triage Notes (Signed)
Patient is complaining of generalized body aches, vomiting, and headache starting today. Patient is conscious, alert, oriented, ambulatory.

## 2016-10-31 NOTE — ED Provider Notes (Signed)
[facility]-EMERGENCY DEPT Provider Note   CSN: [id number] Arrival date & time: 10/31/16  1014     History   Chief Complaint Chief Complaint  Patient presents with  . Flu Like Symptoms    HPI Christine Orozco is a 31 y.o. female.  HPI  31 y.o. female presents to the Emergency Department today complaining of flu-like symptoms that started today. Associated N/V. No diarrhea. No CP/SOB/ABD pain. Notes dry cough, sinus congestion, rhinorrhea. Notes sick contacts at work with flu diagnosed. Subjective fevers at home. Has not tried OTC therapy. No other symptoms noted.   Past Medical History:  Diagnosis Date  . Hepatitis B antibody positive   . Vaginal Pap smear, abnormal     Patient Active Problem List   Diagnosis Date Noted  . 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 CERVICAL BIOPSY  W/ LOOP ELECTRODE EXCISION    . extraction of wisdom teeth      OB History    Gravida Para Term Preterm AB Living   2 1 1   1 1    SAB TAB Ectopic Multiple Live Births   1               Home Medications    Prior to Admission medications   Medication Sig Start Date End Date Taking? Authorizing Provider  acetaminophen (TYLENOL) 500 MG tablet Take 1 tablet (500 mg total) by mouth every 6 (six) hours as needed. Patient not taking: Reported on 08/19/2016 05/17/16   Dan HumphreysMichael Irick, MD  ibuprofen (ADVIL,MOTRIN) 200 MG tablet Take 4 tablets (800 mg total) by mouth every 8 (eight) hours as needed for moderate pain or cramping. 07/01/16   Donette LarryMelanie Bhambri, CNM  norgestimate-ethinyl estradiol (ORTHO-CYCLEN,SPRINTEC,PREVIFEM) 0.25-35 MG-MCG tablet Take 1 tablet by mouth daily. 03/16/16   Judeth HornErin Lawrence, NP  promethazine (PHENERGAN) 25 MG tablet Take 1 tablet (25 mg total) by mouth every 8 (eight) hours as needed for nausea or vomiting. 08/19/16   Charlestine Nighthristopher Lawyer, PA-C  sodium chloride (OCEAN) 0.65 % SOLN nasal spray Place 1 spray into both nostrils  as needed for congestion. Patient not taking: Reported on 08/19/2016 05/17/16   Dan HumphreysMichael Irick, MD    Family History Family History  Problem Relation Age of Onset  . Adopted: Yes    Social History Social History  Substance Use Topics  . Smoking status: Former Smoker    Packs/day: 0.25  . Smokeless tobacco: Never Used  . Alcohol use Yes     Comment: rare     Allergies   Beef-derived products; Ultram [tramadol]; Allegra [fexofenadine]; Augmentin [amoxicillin-pot clavulanate]; Benadryl [diphenhydramine]; Naldecon senior [guaifenesin]; Sudafed [pseudoephedrine]; and Zyrtec [cetirizine]   Review of Systems Review of Systems ROS reviewed and all are negative for acute change except as noted in the HPI.  Physical Exam Updated Vital Signs BP 130/88   Pulse 88   Temp 97.9 F (36.6 C)   Resp 16   Ht 5\' 3"  (1.6 m)   Wt 63.5 kg   LMP 10/29/2016   SpO2 99%   BMI 24.80 kg/m   Physical Exam  Constitutional: She is oriented to person, place, and time. She appears well-developed and well-nourished. No distress.  HENT:  Head: Normocephalic and atraumatic.  Right Ear: Tympanic membrane, external ear and ear canal normal.  Left Ear: Tympanic membrane, external ear and ear canal normal.  Nose: Nose normal.  Mouth/Throat: Uvula is midline, oropharynx is clear and  moist and mucous membranes are normal. No trismus in the jaw. No oropharyngeal exudate, posterior oropharyngeal erythema or tonsillar abscesses.  Eyes: EOM are normal. Pupils are equal, round, and reactive to light.  Neck: Normal range of motion. Neck supple. No tracheal deviation present.  Cardiovascular: Normal rate, regular rhythm, S1 normal, S2 normal, normal heart sounds, intact distal pulses and normal pulses.   Pulmonary/Chest: Effort normal and breath sounds normal. No respiratory distress. She has no decreased breath sounds. She has no wheezes. She has no rhonchi. She has no rales.  Abdominal: Normal appearance and  bowel sounds are normal. There is no tenderness.  Musculoskeletal: Normal range of motion.  Neurological: She is alert and oriented to person, place, and time.  Skin: Skin is warm and dry.  Psychiatric: She has a normal mood and affect. Her speech is normal and behavior is normal. Thought content normal.   ED Treatments / Results  Labs (all labs ordered are listed, but only abnormal results are displayed) Labs Reviewed  COMPREHENSIVE METABOLIC PANEL - Abnormal; Notable for the following:       Result Value   Glucose, Bld 109 (*)    All other components within normal limits  CBC - Abnormal; Notable for the following:    WBC 11.4 (*)    All other components within normal limits  URINALYSIS, ROUTINE W REFLEX MICROSCOPIC - Abnormal; Notable for the following:    Hgb urine dipstick LARGE (*)    Ketones, ur 5 (*)    Bacteria, UA RARE (*)    Squamous Epithelial / LPF 0-5 (*)    All other components within normal limits  LIPASE, BLOOD  I-STAT BETA HCG BLOOD, ED ([facility], [facility], [facility] ONLY)   EKG  EKG Interpretation None      Radiology No results found.  Procedures Procedures (including critical care time)  Medications Ordered in ED Medications - No data to display   Initial Impression / Assessment and Plan / ED Course  I have reviewed the triage vital signs and the nursing notes.  Pertinent labs & imaging results that were available during my care of the patient were reviewed by me and considered in my medical decision making (see chart for details).  Final Clinical Impressions(s) / ED Diagnoses  {I have reviewed and evaluated the relevant laboratory values.   {I have reviewed the relevant previous healthcare records.  {I obtained HPI from historian.   ED Course:  Assessment: Patient with symptoms consistent with influenza.  Vitals are stable, low-grade fever.  No signs of dehydration, tolerating PO's.  Lungs are clear. Due to patient's presentation and physical exam a chest x-ray  was not ordered bc likely diagnosis of flu. Labs unremarkable. Discussed the cost versus benefit of Tamiflu treatment with the patient.  Patient will be discharged with instructions to orally hydrate, rest, and use over-the-counter medications such as anti-inflammatories ibuprofen and Aleve for muscle aches and Tylenol for fever.  Patient will also be given a cough suppressant.   Disposition/Plan:  DC Home Additional Verbal discharge instructions given and discussed with patient.  Pt Instructed to f/u with PCP in the next week for evaluation and treatment of symptoms. Return precautions given Pt acknowledges and agrees with plan  Supervising Physician Maia Plan, MD  Final diagnoses:  Flu-like symptoms    New Prescriptions New Prescriptions   No medications on file     Audry Pili, PA-C 10/31/16 1417    Maia Plan, MD 11/01/16 1047

## 2016-11-24 ENCOUNTER — Emergency Department (HOSPITAL_COMMUNITY)
Admission: EM | Admit: 2016-11-24 | Discharge: 2016-11-24 | Disposition: A | Discharge status: Home / Self Care | Payer: Medicaid Other | Attending: Emergency Medicine | Admitting: Emergency Medicine

## 2016-11-24 ENCOUNTER — Encounter (HOSPITAL_COMMUNITY): Payer: Self-pay

## 2016-11-24 DIAGNOSIS — S21039A Puncture wound without foreign body of unspecified breast, initial encounter: Secondary | ICD-10-CM

## 2016-11-24 DIAGNOSIS — Z87891 Personal history of nicotine dependence: Secondary | ICD-10-CM | POA: Insufficient documentation

## 2016-11-24 DIAGNOSIS — N61 Mastitis without abscess: Secondary | ICD-10-CM | POA: Insufficient documentation

## 2016-11-24 IMAGING — CT CT RENAL STONE PROTOCOL
2 of 3 series · 16 of 46 positions shown, 18 images · non-contrast
Comparison: None.

CLINICAL DATA: Right lower quadrant pain and right flank pain.
Vomiting.

EXAM:
CT ABDOMEN AND PELVIS WITHOUT CONTRAST
TECHNIQUE: Multidetector CT imaging of the abdomen and pelvis was performed
following the standard protocol without IV contrast.

[Series 3: coronal · coronal · 0.75mm/px · 3 of 112 slices shown]
[im 38/112  soft-tissue]
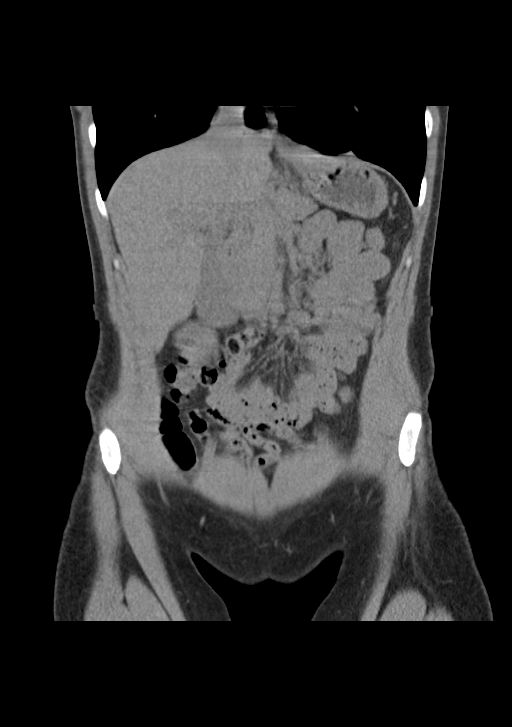
[im 50/112  soft-tissue]
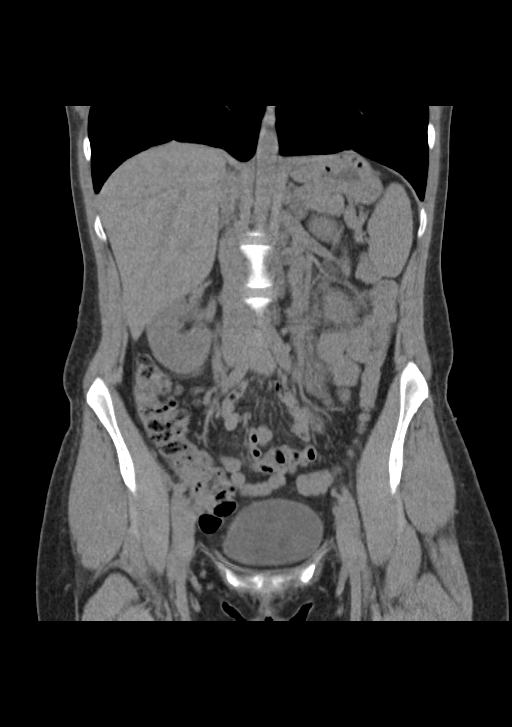
[im 62/112  soft-tissue]
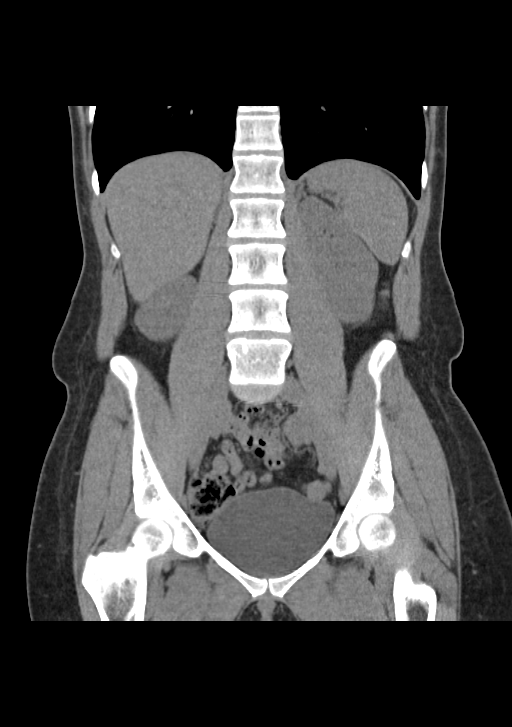

[Series 6: lung · axial · 0.66mm/px · z∈[-25,+59]mm · 13 of 50 slices shown, 15 images]
[im 4/50  soft-tissue]
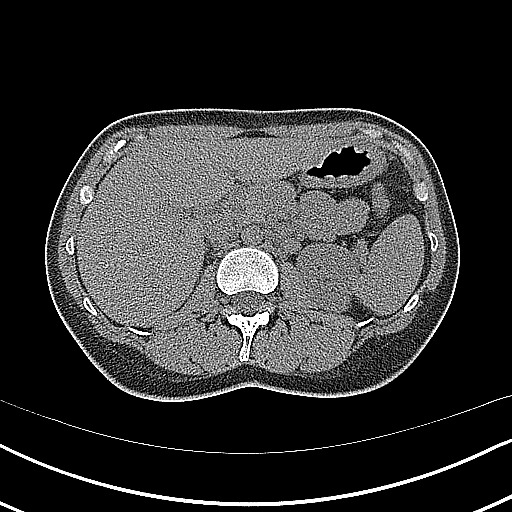
[im 4/50  bone]
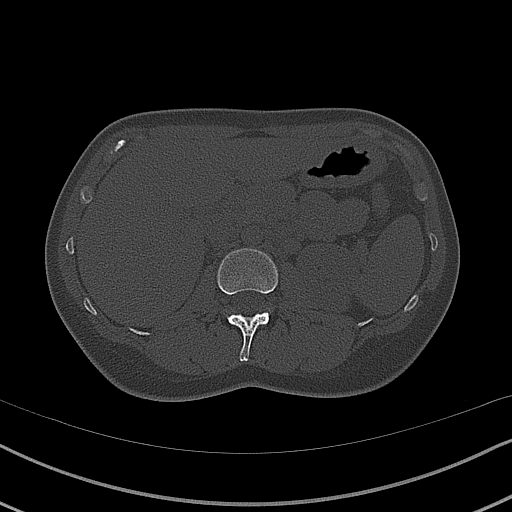
[im 7/50  soft-tissue]
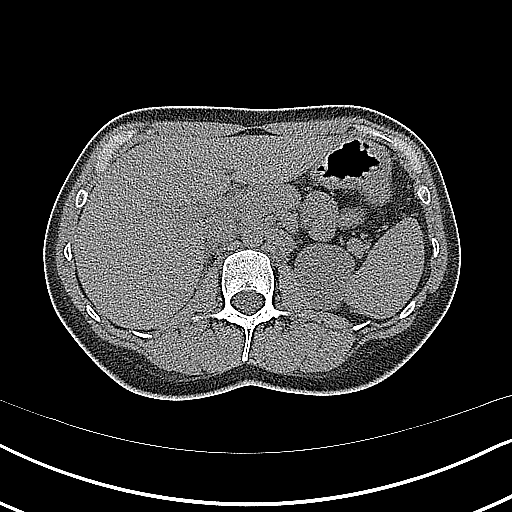
[im 10/50  soft-tissue]
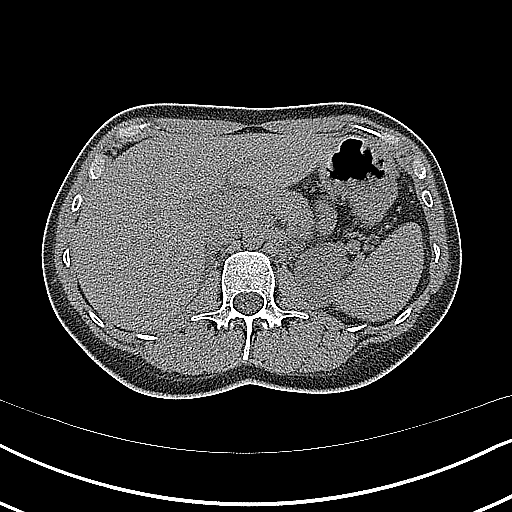
[im 15/50  soft-tissue]
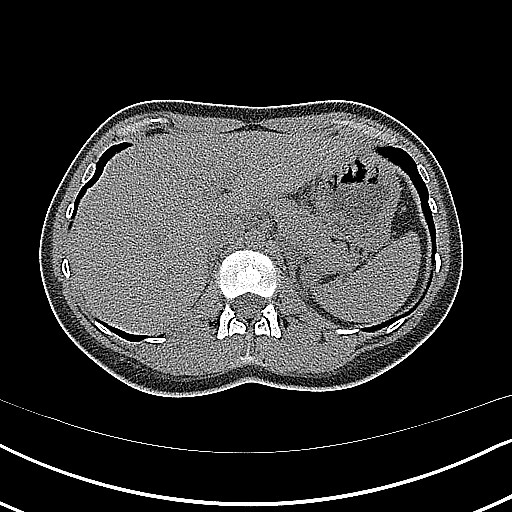
[im 18/50  soft-tissue]
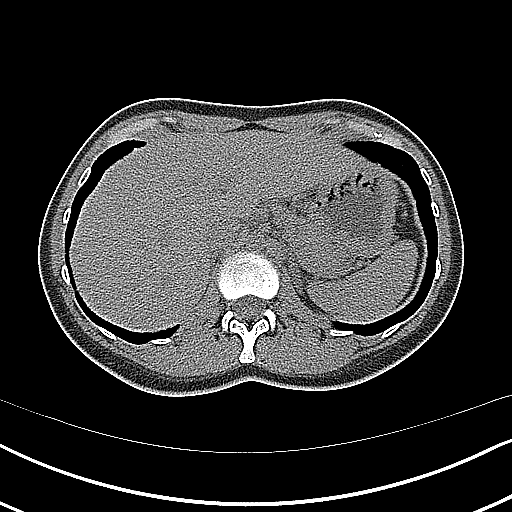
[im 21/50  soft-tissue]
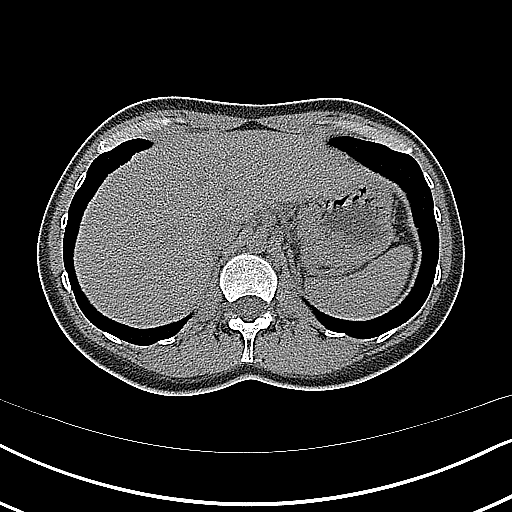
[im 26/50  soft-tissue]
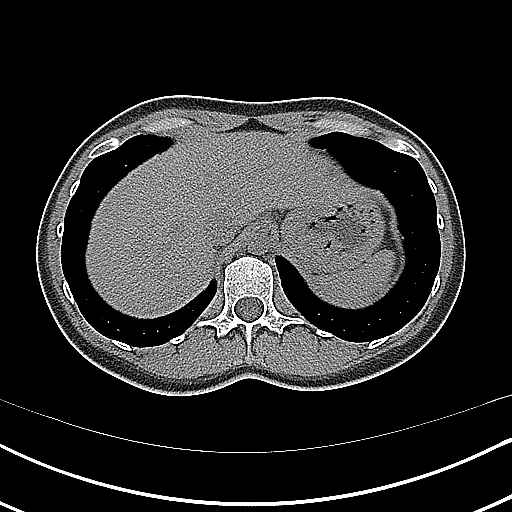
[im 29/50  soft-tissue]
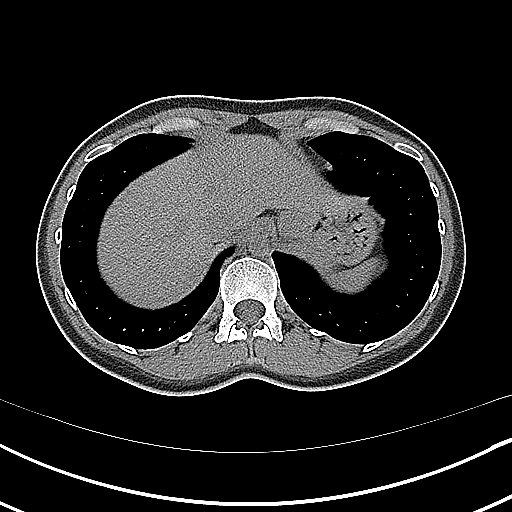
[im 32/50  soft-tissue]
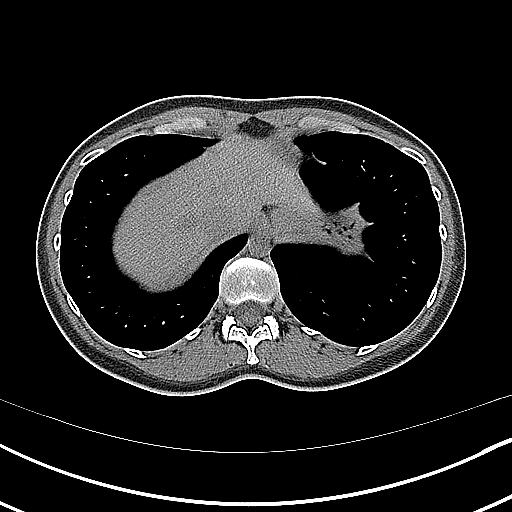
[im 32/50  bone]
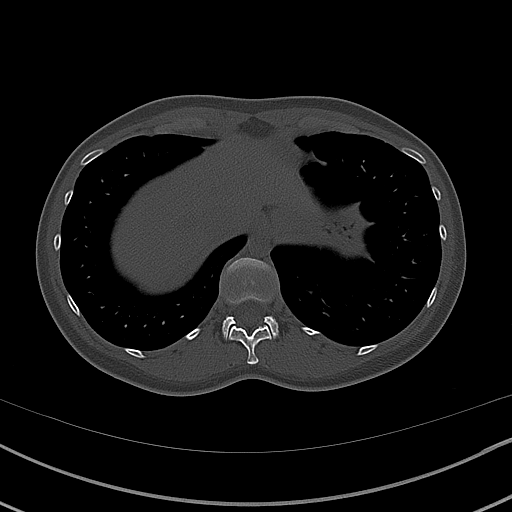
[im 35/50  soft-tissue]
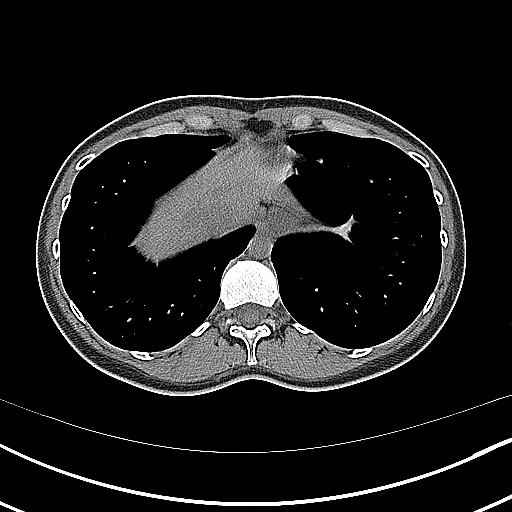
[im 40/50  soft-tissue]
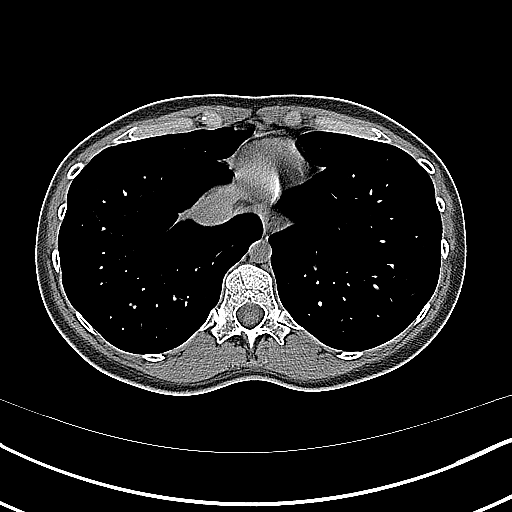
[im 43/50  soft-tissue]
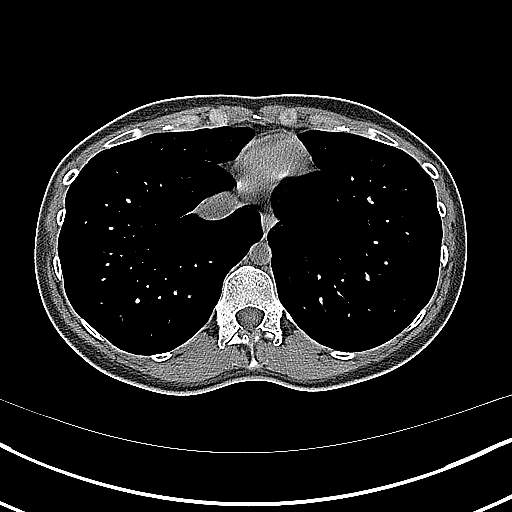
[im 46/50  soft-tissue]
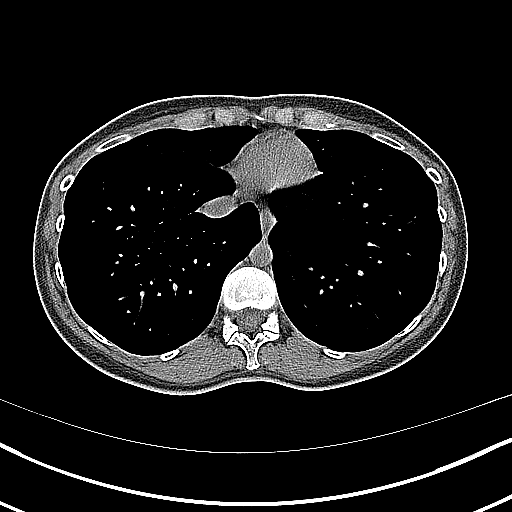

[16 of 46 positions shown; findings below may reference images not displayed]

FINDINGS: Lower chest:  Normal.

Hepatobiliary: Normal.

Pancreas: Normal.

Spleen: Normal.

Adrenals/Urinary Tract: 2 mm stone in the lower pole of the right
kidney. Adrenal glands and left kidney are normal. No
hydronephrosis. Ureters and bladder appear normal.

Stomach/Bowel: Normal including the terminal ileum and appendix.

Vascular/Lymphatic: Normal.

Reproductive: 2 cm low-density area in the left ovary, most likely a
dominant follicle. Right ovary and uterus appear normal.

Other: No free air or free fluid.

Musculoskeletal: Normal.
IMPRESSION: No acute abnormalities of the abdomen or pelvis. 2 mm stone in the
lower pole of the right kidney.

Probable dominant follicle on the left ovary.

## 2016-11-24 MED ORDER — CLINDAMYCIN HCL 300 MG PO CAPS
450.0000 mg | ORAL_CAPSULE | Freq: Once | ORAL | Status: AC
  Administered 2016-11-24: 450 mg via ORAL
  Filled 2016-11-24: qty 1

## 2016-11-24 MED ORDER — LORAZEPAM 1 MG PO TABS
1.0000 mg | ORAL_TABLET | Freq: Once | ORAL | Status: AC
  Administered 2016-11-24: 1 mg via ORAL
  Filled 2016-11-24: qty 1

## 2016-11-24 MED ORDER — CLINDAMYCIN HCL 150 MG PO CAPS: 300.0000 mg | ORAL_CAPSULE | Freq: Three times a day (TID) | ORAL | 0 refills | Status: DC

## 2016-11-24 MED ORDER — IBUPROFEN 200 MG PO TABS
600.0000 mg | ORAL_TABLET | Freq: Once | ORAL | Status: AC
  Administered 2016-11-24: 600 mg via ORAL
  Filled 2016-11-24: qty 3

## 2016-11-24 NOTE — Discharge Instructions (Signed)
We did not incise (cut) this area since it is already spontaneously draining. The instructions/care are essentially the same at this point though. Do not replace the piercing. It needs to stay out. Having a foreign body like a piercing places you at risk for infection. Use warm compresses to facilitate further drainage. Take antibiotics until finished. Return for symptoms as discussed. Take ibuprofen for pain as needed.

## 2016-11-24 NOTE — ED Provider Notes (Signed)
[facility]-EMERGENCY DEPT Provider Note   CSN: [id number] Arrival date & time: 11/24/16  [id number]     History   Chief Complaint Chief Complaint  Patient presents with  . Wound Infection    HPI Christine Orozco is a 31 y.o. female.  HPI   31yF with pain/swelling/drainage around piercing R nipple. Initially placed in September. Accidentally pulled it out on Monday when she thinks it caught on her bra. She cleaned it and reinserted it. Since then developed symptoms. She tried removing it but ti was too painful. No fever or chills. No hx of diabetes, chronic steroid usage, known immunocompromise, etc.   Past Medical History:  Diagnosis Date  . Hepatitis B antibody positive   . Vaginal Pap smear, abnormal     Patient Active Problem List   Diagnosis Date Noted  . 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 CERVICAL BIOPSY  W/ LOOP ELECTRODE EXCISION    . extraction of wisdom teeth      OB History    Gravida Para Term Preterm AB Living   2 1 1   1 1    SAB TAB Ectopic Multiple Live Births   1               Home Medications    Prior to Admission medications   Medication Sig Start Date End Date Taking? Authorizing Provider  acetaminophen (TYLENOL) 500 MG tablet Take 1 tablet (500 mg total) by mouth every 6 (six) hours as needed. Patient not taking: Reported on 08/19/2016 05/17/16   Dan HumphreysMichael Irick, MD  clindamycin (CLEOCIN) 150 MG capsule Take 2 capsules (300 mg total) by mouth 3 (three) times daily. 11/24/16   Raeford RazorStephen Epic Tribbett, MD  ibuprofen (ADVIL,MOTRIN) 200 MG tablet Take 4 tablets (800 mg total) by mouth every 8 (eight) hours as needed for moderate pain or cramping. Patient not taking: Reported on 10/31/2016 07/01/16   Donette LarryMelanie Bhambri, CNM  norgestimate-ethinyl estradiol (ORTHO-CYCLEN,SPRINTEC,PREVIFEM) 0.25-35 MG-MCG tablet Take 1 tablet by mouth daily. 03/16/16   Judeth HornErin Lawrence, NP  ondansetron (ZOFRAN) 4 MG tablet Take 1  tablet (4 mg total) by mouth every 6 (six) hours. 10/31/16   Audry Piliyler Mohr, PA-C  oseltamivir (TAMIFLU) 75 MG capsule Take 1 capsule (75 mg total) by mouth every 12 (twelve) hours. 10/31/16   Audry Piliyler Mohr, PA-C  promethazine (PHENERGAN) 25 MG tablet Take 1 tablet (25 mg total) by mouth every 8 (eight) hours as needed for nausea or vomiting. Patient not taking: Reported on 10/31/2016 08/19/16   Charlestine Nighthristopher Lawyer, PA-C  sodium chloride (OCEAN) 0.65 % SOLN nasal spray Place 1 spray into both nostrils as needed for congestion. Patient not taking: Reported on 08/19/2016 05/17/16   Dan HumphreysMichael Irick, MD    Family History Family History  Problem Relation Age of Onset  . Adopted: Yes    Social History Social History  Substance Use Topics  . Smoking status: Former Smoker    Packs/day: 0.25  . Smokeless tobacco: Never Used  . Alcohol use Yes     Comment: rare     Allergies   Beef-derived products; Ultram [tramadol]; Allegra [fexofenadine]; Augmentin [amoxicillin-pot clavulanate]; Benadryl [diphenhydramine]; Naldecon senior [guaifenesin]; Sudafed [pseudoephedrine]; and Zyrtec [cetirizine]   Review of Systems Review of Systems  All systems reviewed and negative, other than as noted in HPI.  Physical Exam Updated Vital Signs BP 144/77 (BP Location: Right Arm)   Pulse 94   Temp 98.7 F (37.1  C) (Oral)   Resp 18   LMP 10/29/2016   SpO2 100%   Physical Exam  Constitutional: She appears well-developed and well-nourished. No distress.  HENT:  Head: Normocephalic and atraumatic.  Eyes: Conjunctivae are normal. Right eye exhibits no discharge. Left eye exhibits no discharge.  Neck: Neck supple.  Cardiovascular: Normal rate, regular rhythm and normal heart sounds.  Exam reveals no gallop and no friction rub.   No murmur heard. Pulmonary/Chest: Effort normal and breath sounds normal. No respiratory distress.  Abdominal: Soft. She exhibits no distension. There is no tenderness.  Musculoskeletal:  She exhibits no edema or tenderness.  Neurological: She is alert. Abnormal reflex: .review. Cranial nerve deficit: .review.  Skin: Skin is warm and dry.  Bar style piercing to base of R nipple. Small amount of purulent drainage from both sides. Mild puffiness and induration of superior half of areola. Breast exam otherwise WNL.   Psychiatric: She has a normal mood and affect. Her behavior is normal. Thought content normal.  Nursing note and vitals reviewed.    ED Treatments / Results  Labs (all labs ordered are listed, but only abnormal results are displayed) Labs Reviewed - No data to display  EKG  EKG Interpretation None       Radiology No results found.  Procedures Procedures (including critical care time)  Medications Ordered in ED Medications  clindamycin (CLEOCIN) capsule 450 mg (not administered)  ibuprofen (ADVIL,MOTRIN) tablet 600 mg (not administered)  LORazepam (ATIVAN) tablet 1 mg (not administered)     Initial Impression / Assessment and Plan / ED Course  I have reviewed the triage vital signs and the nursing notes.  Pertinent labs & imaging results that were available during my care of the patient were reviewed by me and considered in my medical decision making (see chart for details).     31 year old female with infected piercing right nipple. I couldn't initially remove it. I left to get some hemostats to better grasp the ball portion of the piercing and in the mean time then patient was able to remove it herself.  A small amount of continued drainage. Mild changes (mild swelling/induration) which do not extend beyond margin of areola. I expect this will resolve with little further complications.  Patient was advised that she's not to reinsert hardware. Continued wound care and return precautions were discussed. Course of antibiotics. PRN NSAIDs.   Final Clinical Impressions(s) / ED Diagnoses   Final diagnoses:  Infected pierced nipple    New  Prescriptions New Prescriptions   CLINDAMYCIN (CLEOCIN) 150 MG CAPSULE    Take 2 capsules (300 mg total) by mouth 3 (three) times daily.     Raeford Razor, MD 11/24/16 [phone]

## 2016-11-24 NOTE — ED Triage Notes (Signed)
Pt here with rt nipple piercing infection.  Has had piercing since September.  However, pt pulled ring on Monday by accident. Site red/swollen.  Unable to get ring out.

## 2017-03-19 IMAGING — CT CT RENAL STONE PROTOCOL
2 of 3 series · 16 of 46 positions shown, 18 images · non-contrast
Comparison: 01/22/2016

CLINICAL DATA: Right flank pain with nausea and vomiting beginning
this morning. Nephrolithiasis.

EXAM:
CT ABDOMEN AND PELVIS WITHOUT CONTRAST
TECHNIQUE: Multidetector CT imaging of the abdomen and pelvis was performed
following the standard protocol without IV contrast.

[Series 4: lung · axial · 0.74mm/px · z∈[+1214,+1316]mm · 13 of 59 slices shown, 15 images]
[im 4/59  soft-tissue]
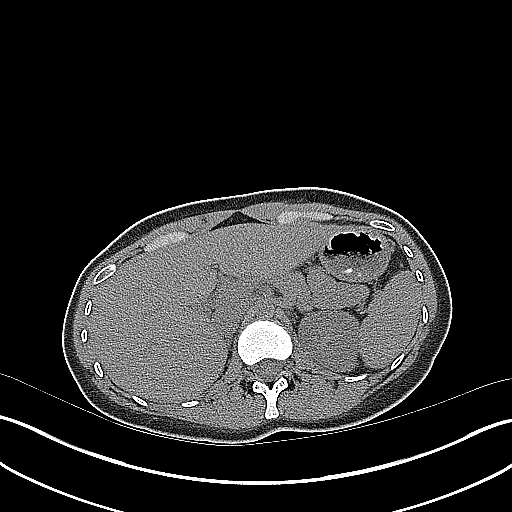
[im 4/59  bone]
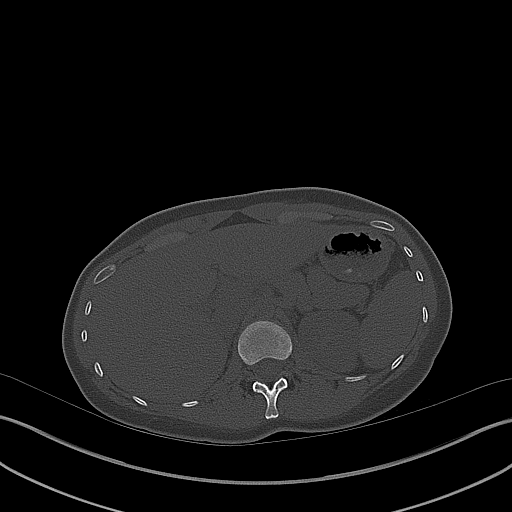
[im 8/59  soft-tissue]
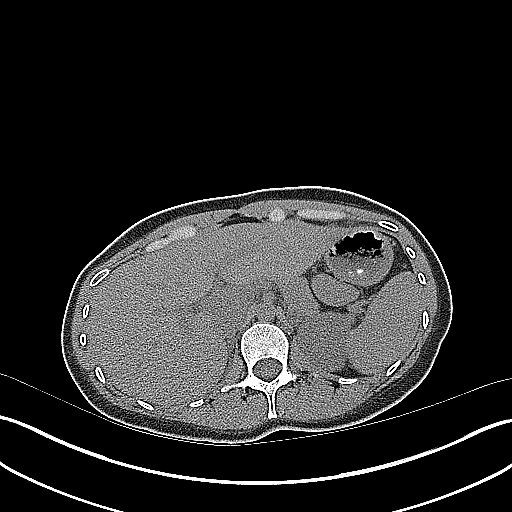
[im 12/59  soft-tissue]
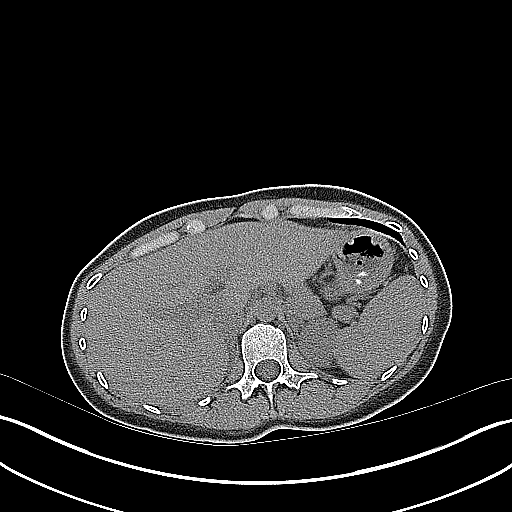
[im 17/59  soft-tissue]
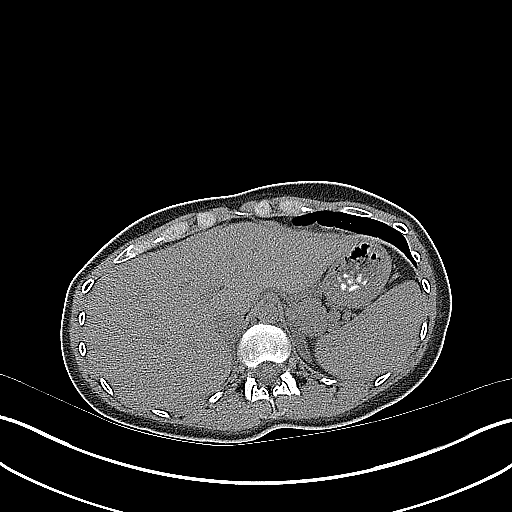
[im 21/59  soft-tissue]
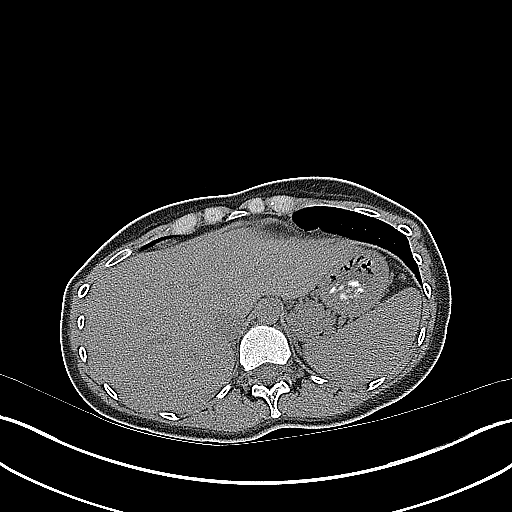
[im 25/59  soft-tissue]
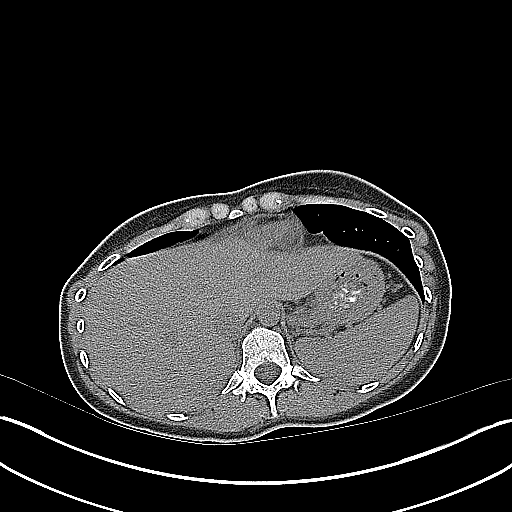
[im 30/59  soft-tissue]
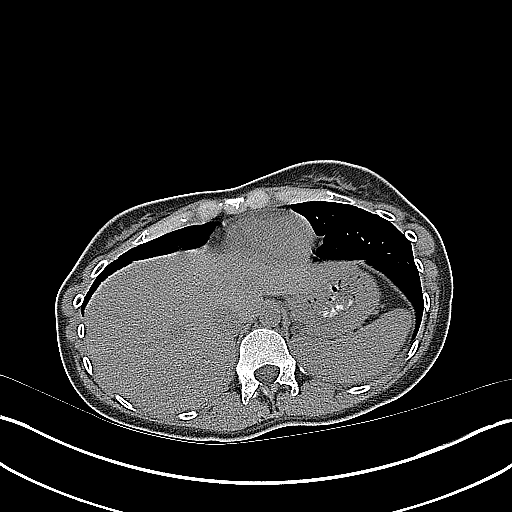
[im 34/59  soft-tissue]
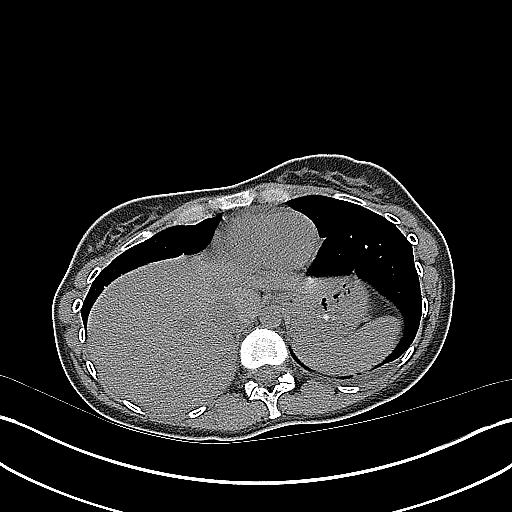
[im 38/59  soft-tissue]
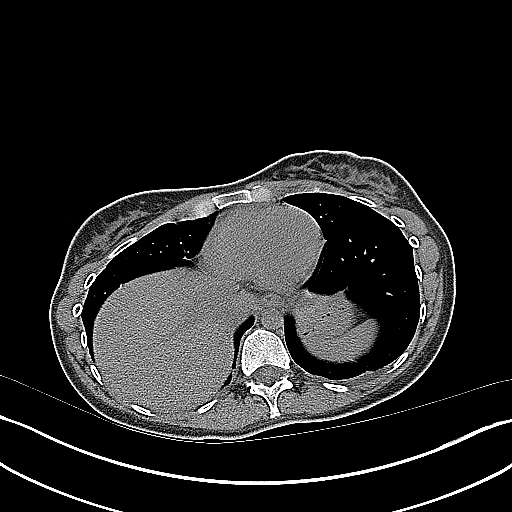
[im 38/59  bone]
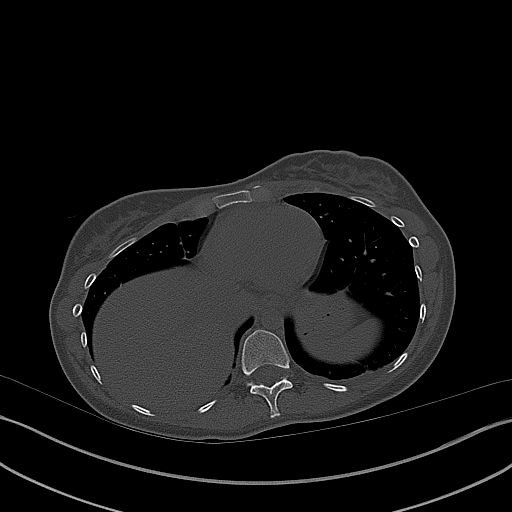
[im 42/59  soft-tissue]
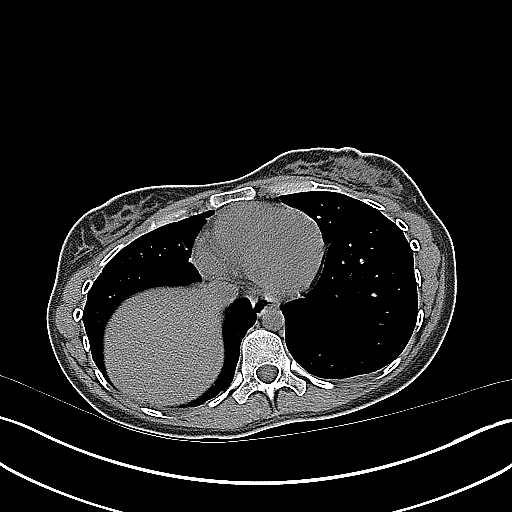
[im 47/59  soft-tissue]
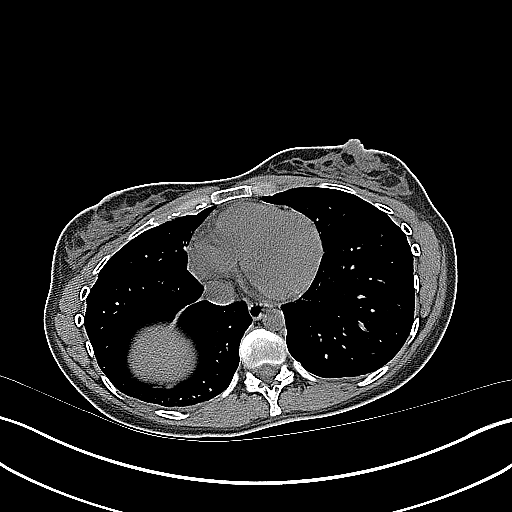
[im 51/59  soft-tissue]
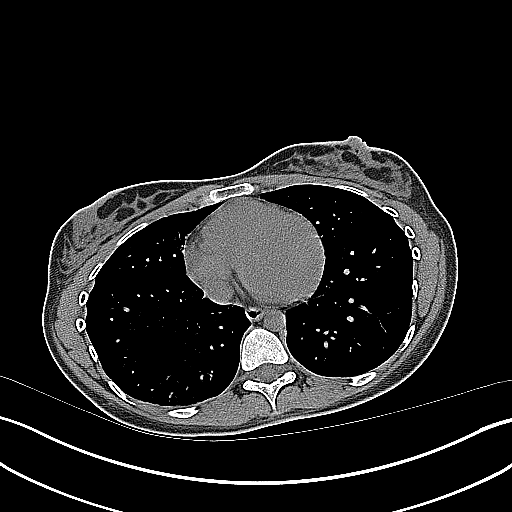
[im 55/59  soft-tissue]
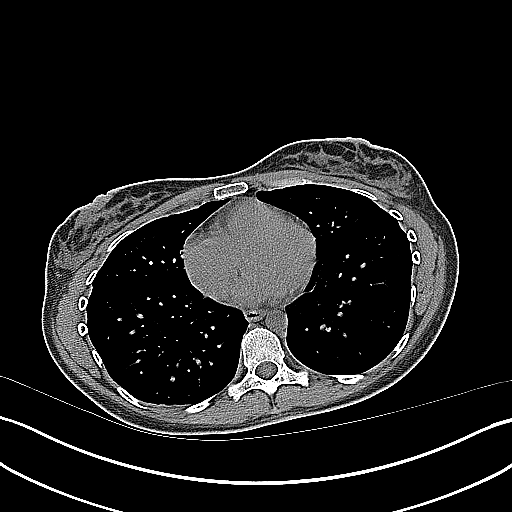

[Series 5: coronal · coronal · 0.63mm/px · 3 of 111 slices shown]
[im 37/111  soft-tissue]
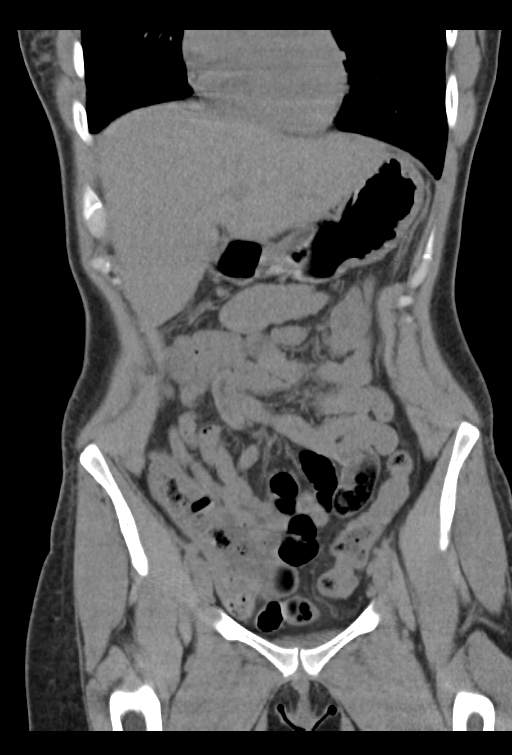
[im 49/111  soft-tissue]
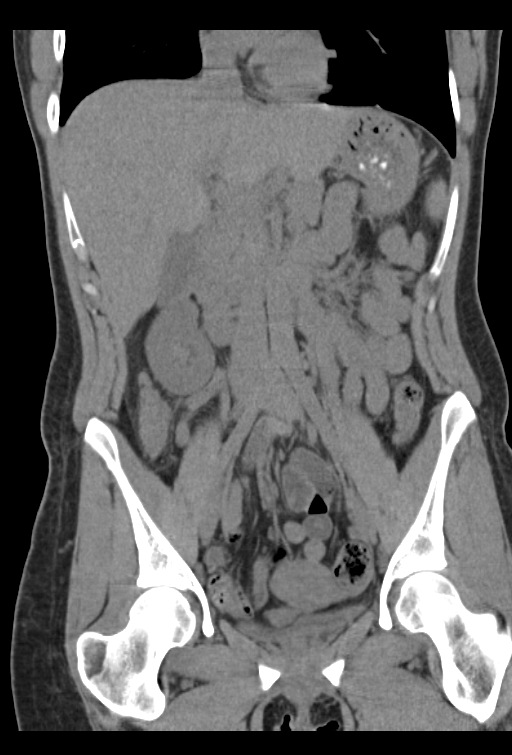
[im 62/111  soft-tissue]
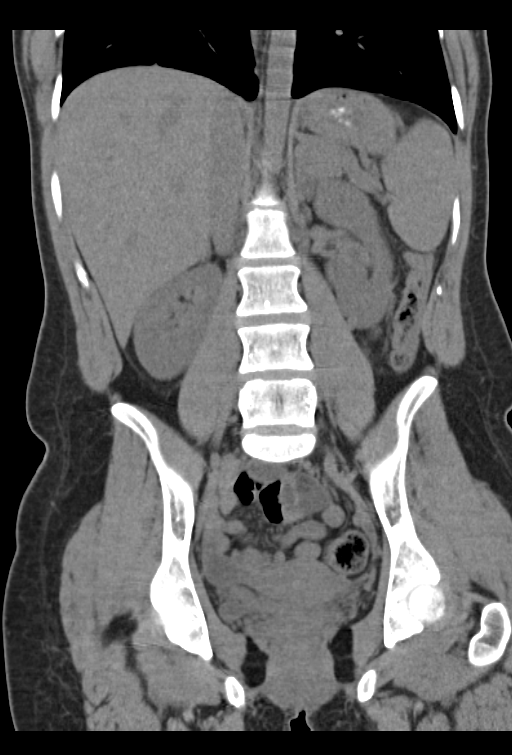

[16 of 46 positions shown; findings below may reference images not displayed]

FINDINGS: Lower chest:  No acute findings.

Hepatobiliary: No mass visualized on this un-enhanced exam.
Gallbladder is unremarkable.

Pancreas: No mass or inflammatory process identified on this
un-enhanced exam.

Spleen: Within normal limits in size.

Adrenals/Urinary Tract: 1 mm nonobstructive calculus noted in
midpole of right kidney. No evidence of ureteral calculi or
hydronephrosis. Urinary bladder is nearly completely collapsed, but
no bladder calculi visualized.

Stomach/Bowel: No evidence of obstruction, inflammatory process, or
abnormal fluid collections. Normal appendix visualized.

Vascular/Lymphatic: No pathologically enlarged lymph nodes. No
evidence of abdominal aortic aneurysm.

Reproductive: No mass or other significant abnormality.

Other: None.

Musculoskeletal:  No suspicious bone lesions identified.
IMPRESSION: 1 mm nonobstructive right renal calculus. No evidence of ureteral
calculi, hydronephrosis, or other acute findings.

## 2017-04-19 ENCOUNTER — Emergency Department (HOSPITAL_COMMUNITY): Payer: Medicaid Other

## 2017-04-19 ENCOUNTER — Encounter (HOSPITAL_COMMUNITY): Payer: Self-pay | Admitting: Emergency Medicine

## 2017-04-19 ENCOUNTER — Emergency Department (HOSPITAL_COMMUNITY)
Admission: EM | Admit: 2017-04-19 | Discharge: 2017-04-19 | Disposition: A | Discharge status: Home / Self Care | Payer: Medicaid Other | Attending: Emergency Medicine | Admitting: Emergency Medicine

## 2017-04-19 DIAGNOSIS — E860 Dehydration: Secondary | ICD-10-CM

## 2017-04-19 DIAGNOSIS — R102 Pelvic and perineal pain: Secondary | ICD-10-CM

## 2017-04-19 DIAGNOSIS — O210 Mild hyperemesis gravidarum: Secondary | ICD-10-CM | POA: Insufficient documentation

## 2017-04-19 DIAGNOSIS — O26891 Other specified pregnancy related conditions, first trimester: Secondary | ICD-10-CM | POA: Diagnosis present

## 2017-04-19 DIAGNOSIS — Z3A01 Less than 8 weeks gestation of pregnancy: Secondary | ICD-10-CM | POA: Diagnosis not present

## 2017-04-19 DIAGNOSIS — Z87891 Personal history of nicotine dependence: Secondary | ICD-10-CM | POA: Insufficient documentation

## 2017-04-19 LAB — HCG, QUANTITATIVE, PREGNANCY: hCG, Beta Chain, Quant, S: 101574 m[IU]/mL — ABNORMAL HIGH (ref <5)

## 2017-04-19 LAB — URINALYSIS, ROUTINE W REFLEX MICROSCOPIC
Bilirubin Urine: NEGATIVE
GLUCOSE, UA: NEGATIVE mg/dL
HGB URINE DIPSTICK: NEGATIVE
Ketones, ur: 5 mg/dL — AB
Leukocytes, UA: NEGATIVE
Nitrite: NEGATIVE
PROTEIN: NEGATIVE mg/dL
SPECIFIC GRAVITY, URINE: 1.023 (ref 1.005–1.030)
pH: 7 (ref 5.0–8.0)

## 2017-04-19 LAB — CBC
HCT: 38 % (ref 36.0–46.0)
Hemoglobin: 13.1 g/dL (ref 12.0–15.0)
MCH: 30.5 pg (ref 26.0–34.0)
MCHC: 34.5 g/dL (ref 30.0–36.0)
MCV: 88.6 fL (ref 78.0–100.0)
PLATELETS: 250 10*3/uL (ref 150–400)
RBC: 4.29 MIL/uL (ref 3.87–5.11)
RDW: 12.9 % (ref 11.5–15.5)
WBC: 10 10*3/uL (ref 4.0–10.5)

## 2017-04-19 LAB — COMPREHENSIVE METABOLIC PANEL
ALBUMIN: 4.3 g/dL (ref 3.5–5.0)
ALT: 18 U/L (ref 14–54)
AST: 21 U/L (ref 15–41)
Alkaline Phosphatase: 55 U/L (ref 38–126)
Anion gap: 8 (ref 5–15)
BUN: 10 mg/dL (ref 6–20)
CHLORIDE: 106 mmol/L (ref 101–111)
CO2: 22 mmol/L (ref 22–32)
CREATININE: 0.74 mg/dL (ref 0.44–1.00)
Calcium: 9 mg/dL (ref 8.9–10.3)
GFR calc non Af Amer: >60 mL/min (ref >60)
GLUCOSE: 100 mg/dL — AB (ref 65–99)
Potassium: 3.4 mmol/L — ABNORMAL LOW (ref 3.5–5.1)
SODIUM: 136 mmol/L (ref 135–145)
Total Bilirubin: 0.7 mg/dL (ref 0.3–1.2)
Total Protein: 7 g/dL (ref 6.5–8.1)

## 2017-04-19 LAB — LIPASE, BLOOD: LIPASE: 14 U/L (ref 11–51)

## 2017-04-19 MED ORDER — PROMETHAZINE HCL 25 MG RE SUPP: 25.0000 mg | Freq: Four times a day (QID) | RECTAL | 0 refills | Status: DC | PRN

## 2017-04-19 MED ORDER — SODIUM CHLORIDE 0.9 % IV BOLUS (SEPSIS)
1000.0000 mL | Freq: Once | INTRAVENOUS | Status: AC
  Administered 2017-04-19: 1000 mL via INTRAVENOUS

## 2017-04-19 MED ORDER — PROMETHAZINE HCL 25 MG PO TABS: 25.0000 mg | ORAL_TABLET | Freq: Four times a day (QID) | ORAL | 0 refills | Status: DC | PRN

## 2017-04-19 MED ORDER — ACETAMINOPHEN 325 MG PO TABS
650.0000 mg | ORAL_TABLET | Freq: Once | ORAL | Status: AC
  Administered 2017-04-19: 650 mg via ORAL
  Filled 2017-04-19: qty 2

## 2017-04-19 NOTE — ED Notes (Signed)
Pt reports tolerating PO well and denies any nausea at present

## 2017-04-19 NOTE — ED Notes (Signed)
Pt is resting comfortably-is aware that she is waiting for her IVF to finish before d/c

## 2017-04-19 NOTE — ED Triage Notes (Signed)
Pt complaint of emesis ongoing since Sunday; new onset lower abdominal pain/cramping yesterday; pt is [redacted] weeks pregnant; denies bleeding/discharge.

## 2017-04-19 NOTE — ED Provider Notes (Signed)
[facility]-EMERGENCY DEPT Provider Note   CSN: [id number] Arrival date & time: 04/19/17  1051     History   Chief Complaint Chief Complaint  Patient presents with  . Abdominal Pain  . Emesis    HPI Christine Orozco is a 31 y.o. female.  HPI PT comes in with cc of abd pain. G3P1 about 7 weeks per date. Pt reports that she has been having 3 days of lower abd pain, back pain.  Pain is constant. Patient has no pain with urination, blood in the urine - but she does have urinary frequency. Pt also denies any vaginal discharge or bleeding. Pt doesn't have recent hx of UTI. + nausea, no emesis, no fevers or chills.   Past Medical History:  Diagnosis Date  . Hepatitis B antibody positive   . Vaginal Pap smear, abnormal     Patient Active Problem List   Diagnosis Date Noted  . 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 CERVICAL BIOPSY  W/ LOOP ELECTRODE EXCISION    . extraction of wisdom teeth      OB History    Gravida Para Term Preterm AB Living   3 1 1   1 1    SAB TAB Ectopic Multiple Live Births   1               Home Medications    Prior to Admission medications   Medication Sig Start Date End Date Taking? Authorizing Provider  acetaminophen (TYLENOL) 500 MG tablet Take 1 tablet (500 mg total) by mouth every 6 (six) hours as needed. Patient taking differently: Take 500 mg by mouth every 6 (six) hours as needed.  05/17/16  Yes Dan Humphreys, MD  sodium chloride (OCEAN) 0.65 % SOLN nasal spray Place 1 spray into both nostrils as needed for congestion. Patient taking differently: Place 2 sprays into both nostrils as needed for congestion.  05/17/16  Yes Dan Humphreys, MD  clindamycin (CLEOCIN) 150 MG capsule Take 2 capsules (300 mg total) by mouth 3 (three) times daily. Patient not taking: Reported on 04/19/2017 11/24/16   Raeford Razor, MD  ibuprofen (ADVIL,MOTRIN) 200 MG tablet Take 4 tablets (800 mg total) by  mouth every 8 (eight) hours as needed for moderate pain or cramping. Patient not taking: Reported on 10/31/2016 07/01/16   Donette Larry, CNM  norgestimate-ethinyl estradiol (ORTHO-CYCLEN,SPRINTEC,PREVIFEM) 0.25-35 MG-MCG tablet Take 1 tablet by mouth daily. Patient not taking: Reported on 04/19/2017 03/16/16   Judeth Horn, NP  ondansetron (ZOFRAN) 4 MG tablet Take 1 tablet (4 mg total) by mouth every 6 (six) hours. Patient not taking: Reported on 04/19/2017 10/31/16   Audry Pili, PA-C  oseltamivir (TAMIFLU) 75 MG capsule Take 1 capsule (75 mg total) by mouth every 12 (twelve) hours. Patient not taking: Reported on 04/19/2017 10/31/16   Audry Pili, PA-C  promethazine (PHENERGAN) 25 MG suppository Place 1 suppository (25 mg total) rectally every 6 (six) hours as needed for nausea. 04/19/17   Derwood Kaplan, MD  promethazine (PHENERGAN) 25 MG tablet Take 1 tablet (25 mg total) by mouth every 6 (six) hours as needed for nausea. 04/19/17   Derwood Kaplan, MD    Family History Family History  Problem Relation Age of Onset  . Adopted: Yes    Social History Social History  Substance Use Topics  . Smoking status: Former Smoker    Packs/day: 0.25  . Smokeless tobacco: Never Used  . Alcohol  use Yes     Comment: rare     Allergies   Beef-derived products; Ultram [tramadol]; Allegra [fexofenadine]; Augmentin [amoxicillin-pot clavulanate]; Benadryl [diphenhydramine]; Naldecon senior [guaifenesin]; Sudafed [pseudoephedrine]; and Zyrtec [cetirizine]   Review of Systems Review of Systems  Gastrointestinal: Positive for abdominal pain.  Genitourinary: Positive for frequency and pelvic pain. Negative for dysuria.  All other systems reviewed and are negative.    Physical Exam Updated Vital Signs BP 114/64 (BP Location: Right Arm)   Pulse 83   Temp 98.4 F (36.9 C) (Oral)   Resp 17   Ht 5\' 3"  (1.6 m)   Wt 64.9 kg (143 lb)   LMP 02/27/2017   SpO2 100%   BMI 25.33 kg/m   Physical  Exam  Constitutional: She is oriented to person, place, and time. She appears well-developed.  HENT:  Head: Normocephalic and atraumatic.  Eyes: EOM are normal.  Neck: Normal range of motion. Neck supple.  Cardiovascular: Normal rate.   Pulmonary/Chest: Effort normal.  Abdominal: Bowel sounds are normal. There is no rebound and no guarding.  Suprapubic and lower quadrant tenderness  Neurological: She is alert and oriented to person, place, and time.  Skin: Skin is warm and dry.  Nursing note and vitals reviewed.    ED Treatments / Results  Labs (all labs ordered are listed, but only abnormal results are displayed) Labs Reviewed  COMPREHENSIVE METABOLIC PANEL - Abnormal; Notable for the following:       Result Value   Potassium 3.4 (*)    Glucose, Bld 100 (*)    All other components within normal limits  URINALYSIS, ROUTINE W REFLEX MICROSCOPIC - Abnormal; Notable for the following:    APPearance HAZY (*)    Ketones, ur 5 (*)    All other components within normal limits  HCG, QUANTITATIVE, PREGNANCY - Abnormal; Notable for the following:    hCG, Beta Chain, Quant, S 101,574 (*)    All other components within normal limits  LIPASE, BLOOD  CBC    EKG  EKG Interpretation None       Radiology US Ob Comp Less 14 Wks  Result Date: 04/19/2017 CLINICAL DATA:  31 y/o  F; 3 days of pelvic pain. EXAM: OBSTETRIC <14 WK [place] AND TRANSVAGINAL OB US TECHNIQUE: Both transabdominal and transvaginal ultrasound examinations were performed for complete evaluation of the gestation as well as the maternal uterus, adnexal regions, and pelvic cul-de-sac. Transvaginal technique was performed to assess early pregnancy. COMPARISON:  None. FINDINGS: Intrauterine gestational sac: Single Yolk sac:  Visualized. Embryo:  Visualized. Cardiac Activity: Visualized. Heart Rate: 131  bpm CRL:  11  mm   7 w   2 d                  [place] EDC: 12/04/2017 Subchorionic hemorrhage: Small to moderate heterogeneous  subchorionic collection, likely hemorrhage. Maternal uterus/adnexae: Negative. IMPRESSION: 1. Single live intrauterine pregnancy with estimated gestational age of [redacted] weeks and 2 days. 2. Small to moderate subchorionic hemorrhage. Electronically Signed   By: Mitzi Hansen M.D.   On: 04/19/2017 13:54   US Ob Transvaginal  Result Date: 04/19/2017 CLINICAL DATA:  31 y/o  F; 3 days of pelvic pain. EXAM: OBSTETRIC <14 WK [place] AND TRANSVAGINAL OB US TECHNIQUE: Both transabdominal and transvaginal ultrasound examinations were performed for complete evaluation of the gestation as well as the maternal uterus, adnexal regions, and pelvic cul-de-sac. Transvaginal technique was performed to assess early pregnancy. COMPARISON:  None. FINDINGS: Intrauterine gestational  sac: Single Yolk sac:  Visualized. Embryo:  Visualized. Cardiac Activity: Visualized. Heart Rate: 131  bpm CRL:  11  mm   7 w   2 d                  US EDC: 12/04/2017 Subchorionic hemorrhage: Small to moderate heterogeneous subchorionic collection, likely hemorrhage. Maternal uterus/adnexae: Negative. IMPRESSION: 1. Single live intrauterine pregnancy with estimated gestational age of [redacted] weeks and 2 days. 2. Small to moderate subchorionic hemorrhage. Electronically Signed   By: Mitzi HansenLance  Furusawa-Stratton M.D.   On: 04/19/2017 13:54    Procedures Procedures (including critical care time)  Medications Ordered in ED Medications  acetaminophen (TYLENOL) tablet 650 mg (650 mg Oral Given 04/19/17 1231)  sodium chloride 0.9 % bolus 1,000 mL (0 mLs Intravenous Stopped 04/19/17 1647)     Initial Impression / Assessment and Plan / ED Course  I have reviewed the triage vital signs and the nursing notes.  Pertinent labs & imaging results that were available during my care of the patient were reviewed by me and considered in my medical decision making (see chart for details).     PT comes in with cc of abd pain. G3P1 in her 1st trimester. Based on  hx, concerns are for UTI/pyelnonephritis. US ordered to r/o ectopic pregnancy. Pt has no vaginal bleeding thus concerns for abortion / miscarriage are low. Pt also is low risk for STDs  LATE ENTRY: Pt US is neg. Results from the ER workup discussed with the patient face to face and all questions answered to the best of my ability. We will hydrate and start oral challenge. Likely the symptoms are due to dehydration. Strict ER return precautions have been discussed, and patient is agreeing with the plan and is comfortable with the workup done and the recommendations from the ER.   Final Clinical Impressions(s) / ED Diagnoses   Final diagnoses:  Pelvic pain in female  Dehydration  Morning sickness    New Prescriptions Discharge Medication List as of 04/19/2017  4:04 PM       Derwood KaplanNanavati, Shamikia Linskey, MD 04/20/17 [phone]

## 2017-04-19 NOTE — Discharge Instructions (Signed)
[facility] week. Call for an appointment.

## 2017-05-31 ENCOUNTER — Other Ambulatory Visit (HOSPITAL_COMMUNITY)
Admission: RE | Admit: 2017-05-31 | Discharge: 2017-05-31 | Disposition: A | Discharge status: Home / Self Care | Payer: Medicaid Other | Source: Ambulatory Visit | Attending: Obstetrics and Gynecology | Admitting: Obstetrics and Gynecology

## 2017-05-31 ENCOUNTER — Ambulatory Visit (INDEPENDENT_AMBULATORY_CARE_PROVIDER_SITE_OTHER): Payer: Medicaid Other | Admitting: Obstetrics and Gynecology

## 2017-05-31 ENCOUNTER — Encounter: Payer: Self-pay | Admitting: *Deleted

## 2017-05-31 ENCOUNTER — Encounter: Payer: Self-pay | Admitting: Obstetrics and Gynecology

## 2017-05-31 DIAGNOSIS — Z3481 Encounter for supervision of other normal pregnancy, first trimester: Secondary | ICD-10-CM

## 2017-05-31 DIAGNOSIS — Z3480 Encounter for supervision of other normal pregnancy, unspecified trimester: Secondary | ICD-10-CM | POA: Insufficient documentation

## 2017-05-31 MED ORDER — HYDROCORTISONE ACETATE 1 % EX CREA: TOPICAL_CREAM | CUTANEOUS | 1 refills | Status: DC

## 2017-05-31 NOTE — Progress Notes (Signed)
  Subjective:    Christine Orozco is a [id number] [id number] being seen today for her first obstetrical visit.  Her obstetrical history is significant for previous full term delivery. Patient does intend to breast feed. Pregnancy history fully reviewed.  Patient reports no complaints.  Vitals:   05/31/17 1415  BP: 133/73  Pulse: 82  Weight: 138 lb (62.6 kg)    HISTORY: OB History  Gravida Para Term Preterm AB Living  3 1 1   1 1   SAB TAB Ectopic Multiple Live Births  1       1    # Outcome Date GA Lbr Len/2nd Weight Sex Delivery Anes PTL Lv  3 Current           2 Term 10/26/07     Vag-Spont   LIV  1 SAB              Past Medical History:  Diagnosis Date  . Hepatitis B antibody positive   . Vaginal Pap smear, abnormal    Past Surgical History:  Procedure Laterality Date  . CERVICAL BIOPSY  W/ LOOP ELECTRODE EXCISION    . extraction of wisdom teeth     Family History  Problem Relation Age of Onset  . Adopted: Yes     Exam    Uterus:   12-weeks in size  Pelvic Exam:    Perineum: No Hemorrhoids, Normal Perineum   Vulva: normal   Vagina:  normal mucosa, normal discharge   pH:    Cervix: multiparous appearance and closed and long   Adnexa: no mass, fullness, tenderness   Bony Pelvis: gynecoid  System: Breast:  normal appearance, no masses or tenderness   Skin: normal coloration and turgor, no rashes    Neurologic: oriented, no focal deficits   Extremities: normal strength, tone, and muscle mass   HEENT extra ocular movement intact   Mouth/Teeth mucous membranes moist, pharynx normal without lesions and dental hygiene good   Neck supple and no masses   Cardiovascular: regular rate and rhythm   Respiratory:  chest clear, no wheezing, crepitations, rhonchi, normal symmetric air entry   Abdomen: soft, non-tender; bowel sounds normal; no masses,  no organomegaly   Urinary:       Assessment:    Pregnancy: G3P1011 Patient Active Problem List   Diagnosis Date Noted   . Supervision of other normal pregnancy, antepartum 05/31/2017  . 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 Prenatal vitamins. Problem list reviewed and updated. Genetic Screening discussed Quad Screen: requested. Will be done at next appointment  Ultrasound discussed; fetal survey: ordered.  Follow up in 4 weeks. Patient is not a BRx candidate due to lack of Internet access 50% of 30 min visit spent on counseling and coordination of care.     Katarina Riebe 05/31/2017

## 2017-05-31 NOTE — Patient Instructions (Addendum)
 Second Trimester of Pregnancy The second trimester is from week 14 through week 27 (months 4 through 6). The second trimester is often a time when you feel your best. Your body has adjusted to being pregnant, and you begin to feel better physically. Usually, morning sickness has lessened or quit completely, you may have more energy, and you may have an increase in appetite. The second trimester is also a time when the fetus is growing rapidly. At the end of the sixth month, the fetus is about 9 inches long and weighs about 1 pounds. You will likely begin to feel the baby move (quickening) between 16 and 20 weeks of pregnancy. Body changes during your second trimester Your body continues to go through many changes during your second trimester. The changes vary from woman to woman.  Your weight will continue to increase. You will notice your lower abdomen bulging out.  You may begin to get stretch marks on your hips, abdomen, and breasts.  You may develop headaches that can be relieved by medicines. The medicines should be approved by your health care provider.  You may urinate more often because the fetus is pressing on your bladder.  You may develop or continue to have heartburn as a result of your pregnancy.  You may develop constipation because certain hormones are causing the muscles that push waste through your intestines to slow down.  You may develop hemorrhoids or swollen, bulging veins (varicose veins).  You may have back pain. This is caused by: ? Weight gain. ? Pregnancy hormones that are relaxing the joints in your pelvis. ? A shift in weight and the muscles that support your balance.  Your breasts will continue to grow and they will continue to become tender.  Your gums may bleed and may be sensitive to brushing and flossing.  Dark spots or blotches (chloasma, mask of pregnancy) may develop on your face. This will likely fade after the baby is born.  A dark line from  your belly button to the pubic area (linea nigra) may appear. This will likely fade after the baby is born.  You may have changes in your hair. These can include thickening of your hair, rapid growth, and changes in texture. Some women also have hair loss during or after pregnancy, or hair that feels dry or thin. Your hair will most likely return to normal after your baby is born.  What to expect at prenatal visits During a routine prenatal visit:  You will be weighed to make sure you and the fetus are growing normally.  Your blood pressure will be taken.  Your abdomen will be measured to track your baby's growth.  The fetal heartbeat will be listened to.  Any test results from the previous visit will be discussed.  Your health care provider may ask you:  How you are feeling.  If you are feeling the baby move.  If you have had any abnormal symptoms, such as leaking fluid, bleeding, severe headaches, or abdominal cramping.  If you are using any tobacco products, including cigarettes, chewing tobacco, and electronic cigarettes.  If you have any questions.  Other tests that may be performed during your second trimester include:  Blood tests that check for: ? Low iron levels (anemia). ? High blood sugar that affects pregnant women (gestational diabetes) between 24 and 28 weeks. ? Rh antibodies. This is to check for a protein on red blood cells (Rh factor).  Urine tests to check for infections, diabetes,   or protein in the urine.  An ultrasound to confirm the proper growth and development of the baby.  An amniocentesis to check for possible genetic problems.  Fetal screens for spina bifida and Down syndrome.  HIV (human immunodeficiency virus) testing. Routine prenatal testing includes screening for HIV, unless you choose not to have this test.  Follow these instructions at home: Medicines  Follow your health care provider's instructions regarding medicine use. Specific  medicines may be either safe or unsafe to take during pregnancy.  Take a prenatal vitamin that contains at least 600 micrograms (mcg) of folic acid.  If you develop constipation, try taking a stool softener if your health care provider approves. Eating and drinking  Eat a balanced diet that includes fresh fruits and vegetables, whole grains, good sources of protein such as meat, eggs, or tofu, and low-fat dairy. Your health care provider will help you determine the amount of weight gain that is right for you.  Avoid raw meat and uncooked cheese. These carry germs that can cause birth defects in the baby.  If you have low calcium intake from food, talk to your health care provider about whether you should take a daily calcium supplement.  Limit foods that are high in fat and processed sugars, such as fried and sweet foods.  To prevent constipation: ? Drink enough fluid to keep your urine clear or pale yellow. ? Eat foods that are high in fiber, such as fresh fruits and vegetables, whole grains, and beans. Activity  Exercise only as directed by your health care provider. Most women can continue their usual exercise routine during pregnancy. Try to exercise for 30 minutes at least 5 days a week. Stop exercising if you experience uterine contractions.  Avoid heavy lifting, wear low heel shoes, and practice good posture.  A sexual relationship may be continued unless your health care provider directs you otherwise. Relieving pain and discomfort  Wear a good support bra to prevent discomfort from breast tenderness.  Take warm sitz baths to soothe any pain or discomfort caused by hemorrhoids. Use hemorrhoid cream if your health care provider approves.  Rest with your legs elevated if you have leg cramps or low back pain.  If you develop varicose veins, wear support hose. Elevate your feet for 15 minutes, 3-4 times a day. Limit salt in your diet. Prenatal Care  Write down your questions.  Take them to your prenatal visits.  Keep all your prenatal visits as told by your health care provider. This is important. Safety  Wear your seat belt at all times when driving.  Make a list of emergency phone numbers, including numbers for family, friends, the hospital, and police and fire departments. General instructions  Ask your health care provider for a referral to a local prenatal education class. Begin classes no later than the beginning of month 6 of your pregnancy.  Ask for help if you have counseling or nutritional needs during pregnancy. Your health care provider can offer advice or refer you to specialists for help with various needs.  Do not use hot tubs, steam rooms, or saunas.  Do not douche or use tampons or scented sanitary pads.  Do not cross your legs for long periods of time.  Avoid cat litter boxes and soil used by cats. These carry germs that can cause birth defects in the baby and possibly loss of the fetus by miscarriage or stillbirth.  Avoid all smoking, herbs, alcohol, and unprescribed drugs. Chemicals in these products   can affect the formation and growth of the baby.  Do not use any products that contain nicotine or tobacco, such as cigarettes and e-cigarettes. If you need help quitting, ask your health care provider.  Visit your dentist if you have not gone yet during your pregnancy. Use a soft toothbrush to brush your teeth and be gentle when you floss. Contact a health care provider if:  You have dizziness.  You have mild pelvic cramps, pelvic pressure, or nagging pain in the abdominal area.  You have persistent nausea, vomiting, or diarrhea.  You have a bad smelling vaginal discharge.  You have pain when you urinate. Get help right away if:  You have a fever.  You are leaking fluid from your vagina.  You have spotting or bleeding from your vagina.  You have severe abdominal cramping or pain.  You have rapid weight gain or weight  loss.  You have shortness of breath with chest pain.  You notice sudden or extreme swelling of your face, hands, ankles, feet, or legs.  You have not felt your baby move in over an hour.  You have severe headaches that do not go away when you take medicine.  You have vision changes. Summary  The second trimester is from week 14 through week 27 (months 4 through 6). It is also a time when the fetus is growing rapidly.  Your body goes through many changes during pregnancy. The changes vary from woman to woman.  Avoid all smoking, herbs, alcohol, and unprescribed drugs. These chemicals affect the formation and growth your baby.  Do not use any tobacco products, such as cigarettes, chewing tobacco, and e-cigarettes. If you need help quitting, ask your health care provider.  Contact your health care provider if you have any questions. Keep all prenatal visits as told by your health care provider. This is important. This information is not intended to replace advice given to you by your health care provider. Make sure you discuss any questions you have with your health care provider. Document Released: 09/13/2001 Document Revised: 02/25/2016 Document Reviewed: 11/20/2012 [employer] Interactive Patient Education  2017 [employer]  Contraception Choices Contraception (birth control) is the use of any methods or devices to prevent pregnancy. Below are some methods to help avoid pregnancy. Hormonal methods  Contraceptive implant. This is a thin, plastic tube containing progesterone hormone. It does not contain estrogen hormone. Your health care provider inserts the tube in the inner part of the upper arm. The tube can remain in place for up to 3 years. After 3 years, the implant must be removed. The implant prevents the ovaries from releasing an egg (ovulation), thickens the cervical mucus to prevent sperm from entering the uterus, and thins the lining of the inside of the  uterus.  Progesterone-only injections. These injections are given every 3 months by your health care provider to prevent pregnancy. This synthetic progesterone hormone stops the ovaries from releasing eggs. It also thickens cervical mucus and changes the uterine lining. This makes it harder for sperm to survive in the uterus.  Birth control pills. These pills contain estrogen and progesterone hormone. They work by preventing the ovaries from releasing eggs (ovulation). They also cause the cervical mucus to thicken, preventing the sperm from entering the uterus. Birth control pills are prescribed by a health care provider.Birth control pills can also be used to treat heavy periods.  Minipill. This type of birth control pill contains only the progesterone hormone. They are taken every day   of each month and must be prescribed by your health care provider.  Birth control patch. The patch contains hormones similar to those in birth control pills. It must be changed once a week and is prescribed by a health care provider.  Vaginal ring. The ring contains hormones similar to those in birth control pills. It is left in the vagina for 3 weeks, removed for 1 week, and then a new one is put back in place. The patient must be comfortable inserting and removing the ring from the vagina.A health care provider's prescription is necessary.  Emergency contraception. Emergency contraceptives prevent pregnancy after unprotected sexual intercourse. This pill can be taken right after sex or up to 5 days after unprotected sex. It is most effective the sooner you take the pills after having sexual intercourse. Most emergency contraceptive pills are available without a prescription. Check with your pharmacist. Do not use emergency contraception as your only form of birth control. Barrier methods  Female condom. This is a thin sheath (latex or rubber) that is worn over the penis during sexual intercourse. It can be used with  spermicide to increase effectiveness.  Female condom. This is a soft, loose-fitting sheath that is put into the vagina before sexual intercourse.  Diaphragm. This is a soft, latex, dome-shaped barrier that must be fitted by a health care provider. It is inserted into the vagina, along with a spermicidal jelly. It is inserted before intercourse. The diaphragm should be left in the vagina for 6 to 8 hours after intercourse.  Cervical cap. This is a round, soft, latex or plastic cup that fits over the cervix and must be fitted by a health care provider. The cap can be left in place for up to 48 hours after intercourse.  Sponge. This is a soft, circular piece of polyurethane foam. The sponge has spermicide in it. It is inserted into the vagina after wetting it and before sexual intercourse.  Spermicides. These are chemicals that kill or block sperm from entering the cervix and uterus. They come in the form of creams, jellies, suppositories, foam, or tablets. They do not require a prescription. They are inserted into the vagina with an applicator before having sexual intercourse. The process must be repeated every time you have sexual intercourse. Intrauterine contraception  Intrauterine device (IUD). This is a T-shaped device that is put in a woman's uterus during a menstrual period to prevent pregnancy. There are 2 types: ? Copper IUD. This type of IUD is wrapped in copper wire and is placed inside the uterus. Copper makes the uterus and fallopian tubes produce a fluid that kills sperm. It can stay in place for 10 years. ? Hormone IUD. This type of IUD contains the hormone progestin (synthetic progesterone). The hormone thickens the cervical mucus and prevents sperm from entering the uterus, and it also thins the uterine lining to prevent implantation of a fertilized egg. The hormone can weaken or kill the sperm that get into the uterus. It can stay in place for 3-5 years, depending on which type of IUD  is used. Permanent methods of contraception  Female tubal ligation. This is when the woman's fallopian tubes are surgically sealed, tied, or blocked to prevent the egg from traveling to the uterus.  Hysteroscopic sterilization. This involves placing a small coil or insert into each fallopian tube. Your doctor uses a technique called hysteroscopy to do the procedure. The device causes scar tissue to form. This results in permanent blockage of the fallopian   tubes, so the sperm cannot fertilize the egg. It takes about 3 months after the procedure for the tubes to become blocked. You must use another form of birth control for these 3 months.  Female sterilization. This is when the female has the tubes that carry sperm tied off (vasectomy).This blocks sperm from entering the vagina during sexual intercourse. After the procedure, the man can still ejaculate fluid (semen). Natural planning methods  Natural family planning. This is not having sexual intercourse or using a barrier method (condom, diaphragm, cervical cap) on days the woman could become pregnant.  Calendar method. This is keeping track of the length of each menstrual cycle and identifying when you are fertile.  Ovulation method. This is avoiding sexual intercourse during ovulation.  Symptothermal method. This is avoiding sexual intercourse during ovulation, using a thermometer and ovulation symptoms.  Post-ovulation method. This is timing sexual intercourse after you have ovulated. Regardless of which type or method of contraception you choose, it is important that you use condoms to protect against the transmission of sexually transmitted infections (STIs). Talk with your health care provider about which form of contraception is most appropriate for you. This information is not intended to replace advice given to you by your health care provider. Make sure you discuss any questions you have with your health care provider. Document Released:  09/19/2005 Document Revised: 02/25/2016 Document Reviewed: 03/14/2013 [employer] Interactive Patient Education  2017 [employer]   Breastfeeding Deciding to breastfeed is one of the best choices you can make for you and your baby. A change in hormones during pregnancy causes your breast tissue to grow and increases the number and size of your milk ducts. These hormones also allow proteins, sugars, and fats from your blood supply to make breast milk in your milk-producing glands. Hormones prevent breast milk from being released before your baby is born as well as prompt milk flow after birth. Once breastfeeding has begun, thoughts of your baby, as well as his or her sucking or crying, can stimulate the release of milk from your milk-producing glands. Benefits of breastfeeding For Your Baby  Your first milk (colostrum) helps your baby's digestive system function better.  There are antibodies in your milk that help your baby fight off infections.  Your baby has a lower incidence of asthma, allergies, and sudden infant death syndrome.  The nutrients in breast milk are better for your baby than infant formulas and are designed uniquely for your baby's needs.  Breast milk improves your baby's brain development.  Your baby is less likely to develop other conditions, such as childhood obesity, asthma, or type 2 diabetes mellitus.  For You  Breastfeeding helps to create a very special bond between you and your baby.  Breastfeeding is convenient. Breast milk is always available at the correct temperature and costs nothing.  Breastfeeding helps to burn calories and helps you lose the weight gained during pregnancy.  Breastfeeding makes your uterus contract to its prepregnancy size faster and slows bleeding (lochia) after you give birth.  Breastfeeding helps to lower your risk of developing type 2 diabetes mellitus, osteoporosis, and breast or ovarian cancer later in life.  Signs that your baby  is hungry Early Signs of Hunger  Increased alertness or activity.  Stretching.  Movement of the head from side to side.  Movement of the head and opening of the mouth when the corner of the mouth or cheek is stroked (rooting).  Increased sucking sounds, smacking lips, cooing, sighing, or   squeaking.  Hand-to-mouth movements.  Increased sucking of fingers or hands.  Late Signs of Hunger  Fussing.  Intermittent crying.  Extreme Signs of Hunger Signs of extreme hunger will require calming and consoling before your baby will be able to breastfeed successfully. Do not wait for the following signs of extreme hunger to occur before you initiate breastfeeding:  Restlessness.  A loud, strong cry.  Screaming.  Breastfeeding basics Breastfeeding Initiation  Find a comfortable place to sit or lie down, with your neck and back well supported.  Place a pillow or rolled up blanket under your baby to bring him or her to the level of your breast (if you are seated). Nursing pillows are specially designed to help support your arms and your baby while you breastfeed.  Make sure that your baby's abdomen is facing your abdomen.  Gently massage your breast. With your fingertips, massage from your chest wall toward your nipple in a circular motion. This encourages milk flow. You may need to continue this action during the feeding if your milk flows slowly.  Support your breast with 4 fingers underneath and your thumb above your nipple. Make sure your fingers are well away from your nipple and your baby's mouth.  Stroke your baby's lips gently with your finger or nipple.  When your baby's mouth is open wide enough, quickly bring your baby to your breast, placing your entire nipple and as much of the colored area around your nipple (areola) as possible into your baby's mouth. ? More areola should be visible above your baby's upper lip than below the lower lip. ? Your baby's tongue should be  between his or her lower gum and your breast.  Ensure that your baby's mouth is correctly positioned around your nipple (latched). Your baby's lips should create a seal on your breast and be turned out (everted).  It is common for your baby to suck about 2-3 minutes in order to start the flow of breast milk.  Latching Teaching your baby how to latch on to your breast properly is very important. An improper latch can cause nipple pain and decreased milk supply for you and poor weight gain in your baby. Also, if your baby is not latched onto your nipple properly, he or she may swallow some air during feeding. This can make your baby fussy. Burping your baby when you switch breasts during the feeding can help to get rid of the air. However, teaching your baby to latch on properly is still the best way to prevent fussiness from swallowing air while breastfeeding. Signs that your baby has successfully latched on to your nipple:  Silent tugging or silent sucking, without causing you pain.  Swallowing heard between every 3-4 sucks.  Muscle movement above and in front of his or her ears while sucking.  Signs that your baby has not successfully latched on to nipple:  Sucking sounds or smacking sounds from your baby while breastfeeding.  Nipple pain.  If you think your baby has not latched on correctly, slip your finger into the corner of your baby's mouth to break the suction and place it between your baby's gums. Attempt breastfeeding initiation again. Signs of Successful Breastfeeding Signs from your baby:  A gradual decrease in the number of sucks or complete cessation of sucking.  Falling asleep.  Relaxation of his or her body.  Retention of a small amount of milk in his or her mouth.  Letting go of your breast by himself or herself.    Signs from you:  Breasts that have increased in firmness, weight, and size 1-3 hours after feeding.  Breasts that are softer immediately after  breastfeeding.  Increased milk volume, as well as a change in milk consistency and color by the fifth day of breastfeeding.  Nipples that are not sore, cracked, or bleeding.  Signs That Your Baby is Getting Enough Milk  Wetting at least 1-2 diapers during the first 24 hours after birth.  Wetting at least 5-6 diapers every 24 hours for the first week after birth. The urine should be clear or pale yellow by 5 days after birth.  Wetting 6-8 diapers every 24 hours as your baby continues to grow and develop.  At least 3 stools in a 24-hour period by age 5 days. The stool should be soft and yellow.  At least 3 stools in a 24-hour period by age 7 days. The stool should be seedy and yellow.  No loss of weight greater than 10% of birth weight during the first 3 days of age.  Average weight gain of 4-7 ounces (113-198 g) per week after age 4 days.  Consistent daily weight gain by age 5 days, without weight loss after the age of 2 weeks.  After a feeding, your baby may spit up a small amount. This is common. Breastfeeding frequency and duration Frequent feeding will help you make more milk and can prevent sore nipples and breast engorgement. Breastfeed when you feel the need to reduce the fullness of your breasts or when your baby shows signs of hunger. This is called "breastfeeding on demand." Avoid introducing a pacifier to your baby while you are working to establish breastfeeding (the first 4-6 weeks after your baby is born). After this time you may choose to use a pacifier. Research has shown that pacifier use during the first year of a baby's life decreases the risk of sudden infant death syndrome (SIDS). Allow your baby to feed on each breast as long as he or she wants. Breastfeed until your baby is finished feeding. When your baby unlatches or falls asleep while feeding from the first breast, offer the second breast. Because newborns are often sleepy in the first few weeks of life, you may  need to awaken your baby to get him or her to feed. Breastfeeding times will vary from baby to baby. However, the following rules can serve as a guide to help you ensure that your baby is properly fed:  Newborns (babies 4 weeks of age or younger) may breastfeed every 1-3 hours.  Newborns should not go longer than 3 hours during the day or 5 hours during the night without breastfeeding.  You should breastfeed your baby a minimum of 8 times in a 24-hour period until you begin to introduce solid foods to your baby at around 6 months of age.  Breast milk pumping Pumping and storing breast milk allows you to ensure that your baby is exclusively fed your breast milk, even at times when you are unable to breastfeed. This is especially important if you are going back to work while you are still breastfeeding or when you are not able to be present during feedings. Your lactation consultant can give you guidelines on how long it is safe to store breast milk. A breast pump is a machine that allows you to pump milk from your breast into a sterile bottle. The pumped breast milk can then be stored in a refrigerator or freezer. Some breast pumps are operated by   hand, while others use electricity. Ask your lactation consultant which type will work best for you. Breast pumps can be purchased, but some hospitals and breastfeeding support groups lease breast pumps on a monthly basis. A lactation consultant can teach you how to hand express breast milk, if you prefer not to use a pump. Caring for your breasts while you breastfeed Nipples can become dry, cracked, and sore while breastfeeding. The following recommendations can help keep your breasts moisturized and healthy:  Avoid using soap on your nipples.  Wear a supportive bra. Although not required, special nursing bras and tank tops are designed to allow access to your breasts for breastfeeding without taking off your entire bra or top. Avoid wearing  underwire-style bras or extremely tight bras.  Air dry your nipples for 3-4minutes after each feeding.  Use only cotton bra pads to absorb leaked breast milk. Leaking of breast milk between feedings is normal.  Use lanolin on your nipples after breastfeeding. Lanolin helps to maintain your skin's normal moisture barrier. If you use pure lanolin, you do not need to wash it off before feeding your baby again. Pure lanolin is not toxic to your baby. You may also hand express a few drops of breast milk and gently massage that milk into your nipples and allow the milk to air dry.  In the first few weeks after giving birth, some women experience extremely full breasts (engorgement). Engorgement can make your breasts feel heavy, warm, and tender to the touch. Engorgement peaks within 3-5 days after you give birth. The following recommendations can help ease engorgement:  Completely empty your breasts while breastfeeding or pumping. You may want to start by applying warm, moist heat (in the shower or with warm water-soaked hand towels) just before feeding or pumping. This increases circulation and helps the milk flow. If your baby does not completely empty your breasts while breastfeeding, pump any extra milk after he or she is finished.  Wear a snug bra (nursing or regular) or tank top for 1-2 days to signal your body to slightly decrease milk production.  Apply ice packs to your breasts, unless this is too uncomfortable for you.  Make sure that your baby is latched on and positioned properly while breastfeeding.  If engorgement persists after 48 hours of following these recommendations, contact your health care provider or a lactation consultant. Overall health care recommendations while breastfeeding  Eat healthy foods. Alternate between meals and snacks, eating 3 of each per day. Because what you eat affects your breast milk, some of the foods may make your baby more irritable than usual. Avoid  eating these foods if you are sure that they are negatively affecting your baby.  Drink milk, fruit juice, and water to satisfy your thirst (about 10 glasses a day).  Rest often, relax, and continue to take your prenatal vitamins to prevent fatigue, stress, and anemia.  Continue breast self-awareness checks.  Avoid chewing and smoking tobacco. Chemicals from cigarettes that pass into breast milk and exposure to secondhand smoke may harm your baby.  Avoid alcohol and drug use, including marijuana. Some medicines that may be harmful to your baby can pass through breast milk. It is important to ask your health care provider before taking any medicine, including all over-the-counter and prescription medicine as well as vitamin and herbal supplements. It is possible to become pregnant while breastfeeding. If birth control is desired, ask your health care provider about options that will be safe for your baby. Contact   a health care provider if:  You feel like you want to stop breastfeeding or have become frustrated with breastfeeding.  You have painful breasts or nipples.  Your nipples are cracked or bleeding.  Your breasts are red, tender, or warm.  You have a swollen area on either breast.  You have a fever or chills.  You have nausea or vomiting.  You have drainage other than breast milk from your nipples.  Your breasts do not become full before feedings by the fifth day after you give birth.  You feel sad and depressed.  Your baby is too sleepy to eat well.  Your baby is having trouble sleeping.  Your baby is wetting less than 3 diapers in a 24-hour period.  Your baby has less than 3 stools in a 24-hour period.  Your baby's skin or the white part of his or her eyes becomes yellow.  Your baby is not gaining weight by 5 days of age. Get help right away if:  Your baby is overly tired (lethargic) and does not want to wake up and feed.  Your baby develops an unexplained  fever. This information is not intended to replace advice given to you by your health care provider. Make sure you discuss any questions you have with your health care provider. Document Released: 09/19/2005 Document Revised: 03/02/2016 Document Reviewed: 03/13/2013 [employer]vier Interactive Patient Education  2017 [employer]  

## 2017-05-31 NOTE — Addendum Note (Signed)
Addended by: Catalina AntiguaONSTANT, Sindy Mccune on: 05/31/2017 02:56 PM   Modules accepted: Orders

## 2017-06-01 LAB — GC/CHLAMYDIA PROBE AMP (~~LOC~~) NOT AT ARMC
Chlamydia: NEGATIVE
NEISSERIA GONORRHEA: NEGATIVE

## 2017-06-02 LAB — CULTURE, OB URINE

## 2017-06-02 LAB — URINE CULTURE, OB REFLEX

## 2017-06-03 LAB — HEMOGLOBINOPATHY EVALUATION
HEMOGLOBIN A2 QUANTITATION: 2.9 % (ref 1.8–3.2)
HEMOGLOBIN F QUANTITATION: 0 % (ref 0.0–2.0)
HGB A: 97.1 % (ref 96.4–98.8)
HGB C: 0 %
HGB S: 0 %
HGB VARIANT: 0 %

## 2017-06-03 LAB — OBSTETRIC PANEL, INCLUDING HIV
ANTIBODY SCREEN: NEGATIVE
Basophils Absolute: 0 10*3/uL (ref 0.0–0.2)
Basos: 0 %
EOS (ABSOLUTE): 0.1 10*3/uL (ref 0.0–0.4)
EOS: 1 %
HEMOGLOBIN: 12.9 g/dL (ref 11.1–15.9)
HIV Screen 4th Generation wRfx: NONREACTIVE
Hematocrit: 38.4 % (ref 34.0–46.6)
Hepatitis B Surface Ag: NEGATIVE
IMMATURE GRANULOCYTES: 1 %
Immature Grans (Abs): 0.1 10*3/uL (ref 0.0–0.1)
Lymphocytes Absolute: 2.2 10*3/uL (ref 0.7–3.1)
Lymphs: 20 %
MCH: 30.9 pg (ref 26.6–33.0)
MCHC: 33.6 g/dL (ref 31.5–35.7)
MCV: 92 fL (ref 79–97)
MONOS ABS: 0.3 10*3/uL (ref 0.1–0.9)
Monocytes: 3 %
NEUTROS PCT: 75 %
Neutrophils Absolute: 8.3 10*3/uL — ABNORMAL HIGH (ref 1.4–7.0)
Platelets: 269 10*3/uL (ref 150–379)
RBC: 4.17 x10E6/uL (ref 3.77–5.28)
RDW: 14 % (ref 12.3–15.4)
RH TYPE: NEGATIVE
RPR Ser Ql: NONREACTIVE
Rubella Antibodies, IGG: <0.9 index — ABNORMAL LOW (ref >0.99)
WBC: 10.9 10*3/uL — AB (ref 3.4–10.8)

## 2017-06-06 LAB — CYTOLOGY - PAP
ADEQUACY: ABSENT
DIAGNOSIS: NEGATIVE
HPV (WINDOPATH): NOT DETECTED

## 2017-06-07 ENCOUNTER — Encounter: Payer: Self-pay | Admitting: Obstetrics and Gynecology

## 2017-06-07 DIAGNOSIS — O26899 Other specified pregnancy related conditions, unspecified trimester: Secondary | ICD-10-CM | POA: Insufficient documentation

## 2017-06-07 DIAGNOSIS — Z6791 Unspecified blood type, Rh negative: Secondary | ICD-10-CM | POA: Insufficient documentation

## 2017-06-07 HISTORY — DX: Unspecified blood type, rh negative: Z67.91

## 2017-06-07 LAB — CYSTIC FIBROSIS MUTATION 97: Interpretation: NOT DETECTED

## 2017-06-11 ENCOUNTER — Encounter: Payer: Self-pay | Admitting: Family Medicine

## 2017-06-11 DIAGNOSIS — Z283 Underimmunization status: Secondary | ICD-10-CM | POA: Insufficient documentation

## 2017-06-11 DIAGNOSIS — O9989 Other specified diseases and conditions complicating pregnancy, childbirth and the puerperium: Secondary | ICD-10-CM

## 2017-06-11 DIAGNOSIS — O09899 Supervision of other high risk pregnancies, unspecified trimester: Secondary | ICD-10-CM | POA: Insufficient documentation

## 2017-06-14 LAB — SMN1 COPY NUMBER ANALYSIS (SMA CARRIER SCREENING)

## 2017-06-28 ENCOUNTER — Encounter: Payer: Self-pay | Admitting: Obstetrics & Gynecology

## 2017-06-28 ENCOUNTER — Ambulatory Visit (INDEPENDENT_AMBULATORY_CARE_PROVIDER_SITE_OTHER): Payer: Medicaid Other | Admitting: Obstetrics & Gynecology

## 2017-06-28 DIAGNOSIS — Z23 Encounter for immunization: Secondary | ICD-10-CM | POA: Diagnosis not present

## 2017-06-28 DIAGNOSIS — Z3480 Encounter for supervision of other normal pregnancy, unspecified trimester: Secondary | ICD-10-CM

## 2017-06-28 DIAGNOSIS — Z3482 Encounter for supervision of other normal pregnancy, second trimester: Secondary | ICD-10-CM

## 2017-06-28 NOTE — Progress Notes (Signed)
   PRENATAL VISIT NOTE  Subjective:  Christine Orozco is a 31 y.o. G3P1011 at [redacted]w[redacted]d being seen today for ongoing prenatal care.  She is currently monitored for the following issues for this low-risk pregnancy and has HGSIL on cytologic smear of cervix; Supervision of other normal pregnancy, antepartum; Rh negative status during pregnancy; and Rubella non-immune status, antepartum on her problem list.  Patient reports no complaints.  Contractions: Not present. Vag. Bleeding: None.  Movement: Present. Denies leaking of fluid.   The following portions of the patient's history were reviewed and updated as appropriate: allergies, current medications, past family history, past medical history, past social history, past surgical history and problem list. Problem list updated.  Objective:   Vitals:   06/28/17 0952  BP: 110/76  Pulse: 88  Weight: 138 lb (62.6 kg)    Fetal Status: Fetal Heart Rate (bpm): 148   Movement: Present     General:  Alert, oriented and cooperative. Patient is in no acute distress.  Skin: Skin is warm and dry. No rash noted.   Cardiovascular: Normal heart rate noted  Respiratory: Normal respiratory effort, no problems with respiration noted  Abdomen: Soft, gravid, appropriate for gestational age.  Pain/Pressure: Absent     Pelvic: Cervical exam deferred        Extremities: Normal range of motion.  Edema: None  Mental Status:  Normal mood and affect. Normal behavior. Normal judgment and thought content.   Assessment and Plan:  Pregnancy: G3P1011 at [redacted]w[redacted]d  1. Supervision of other normal pregnancy, antepartum  - Flu Vaccine QUAD 36+ mos IM (Fluarix, Quad PF) - AFP TETRA  Preterm labor symptoms and general obstetric precautions including but not limited to vaginal bleeding, contractions, leaking of fluid and fetal movement were reviewed in detail with the patient. Please refer to After Visit Summary for other counseling recommendations.  Return in about 4 weeks  (around 07/26/2017). MFM complete US in 2 weeks  Scheryl Darter, MD

## 2017-06-28 NOTE — Patient Instructions (Signed)

## 2017-07-03 LAB — AFP TETRA
DIA MOM VALUE: 2.01
DIA Value (EIA): 334.66 pg/mL
DSR (BY AGE) 1 IN: 613
DSR (SECOND TRIMESTER) 1 IN: 2366
Gestational Age: 17.3 WEEKS
MATERNAL AGE AT EDD: 31 a
MSAFP MOM: 1.52
MSAFP: 59.6 ng/mL
MSHCG Mom: 2.25
MSHCG: 68250 m[IU]/mL
Osb Risk: 2591
T18 (By Age): 1:2388 {titer}
TEST RESULTS AFP: NEGATIVE
WEIGHT: 148 [lb_av]
uE3 Mom: 1.04
uE3 Value: 1.12 ng/mL

## 2017-07-04 ENCOUNTER — Encounter (HOSPITAL_COMMUNITY): Payer: Self-pay | Admitting: Obstetrics and Gynecology

## 2017-07-11 ENCOUNTER — Other Ambulatory Visit: Payer: Self-pay | Admitting: Obstetrics and Gynecology

## 2017-07-11 ENCOUNTER — Ambulatory Visit (HOSPITAL_COMMUNITY)
Admission: RE | Admit: 2017-07-11 | Discharge: 2017-07-11 | Disposition: A | Discharge status: Home / Self Care | Payer: Medicaid Other | Source: Ambulatory Visit | Attending: Obstetrics and Gynecology | Admitting: Obstetrics and Gynecology

## 2017-07-11 DIAGNOSIS — Z369 Encounter for antenatal screening, unspecified: Secondary | ICD-10-CM

## 2017-07-11 DIAGNOSIS — Z363 Encounter for antenatal screening for malformations: Secondary | ICD-10-CM | POA: Diagnosis present

## 2017-07-11 DIAGNOSIS — Z3A19 19 weeks gestation of pregnancy: Secondary | ICD-10-CM

## 2017-07-11 DIAGNOSIS — O26872 Cervical shortening, second trimester: Secondary | ICD-10-CM | POA: Diagnosis not present

## 2017-07-11 DIAGNOSIS — Z3480 Encounter for supervision of other normal pregnancy, unspecified trimester: Secondary | ICD-10-CM

## 2017-07-13 ENCOUNTER — Other Ambulatory Visit: Payer: Self-pay | Admitting: *Deleted

## 2017-07-13 DIAGNOSIS — O26872 Cervical shortening, second trimester: Secondary | ICD-10-CM

## 2017-07-13 DIAGNOSIS — Z3492 Encounter for supervision of normal pregnancy, unspecified, second trimester: Secondary | ICD-10-CM

## 2017-07-13 NOTE — Progress Notes (Signed)
Additional order for MFM u/s per dept request.

## 2017-07-14 ENCOUNTER — Other Ambulatory Visit: Payer: Self-pay | Admitting: Obstetrics and Gynecology

## 2017-07-14 DIAGNOSIS — Z3480 Encounter for supervision of other normal pregnancy, unspecified trimester: Secondary | ICD-10-CM

## 2017-07-14 DIAGNOSIS — O26879 Cervical shortening, unspecified trimester: Secondary | ICD-10-CM

## 2017-07-17 ENCOUNTER — Encounter: Payer: Self-pay | Admitting: Obstetrics and Gynecology

## 2017-07-19 ENCOUNTER — Ambulatory Visit (HOSPITAL_COMMUNITY): Payer: Medicaid Other

## 2017-07-20 ENCOUNTER — Ambulatory Visit (HOSPITAL_COMMUNITY)
Admission: RE | Admit: 2017-07-20 | Discharge: 2017-07-20 | Disposition: A | Discharge status: Home / Self Care | Payer: Medicaid Other | Source: Ambulatory Visit | Attending: Obstetrics and Gynecology | Admitting: Obstetrics and Gynecology

## 2017-07-20 ENCOUNTER — Other Ambulatory Visit: Payer: Self-pay | Admitting: Obstetrics and Gynecology

## 2017-07-20 DIAGNOSIS — Z3A20 20 weeks gestation of pregnancy: Secondary | ICD-10-CM | POA: Insufficient documentation

## 2017-07-20 DIAGNOSIS — Z3686 Encounter for antenatal screening for cervical length: Secondary | ICD-10-CM | POA: Insufficient documentation

## 2017-07-20 DIAGNOSIS — O09212 Supervision of pregnancy with history of pre-term labor, second trimester: Secondary | ICD-10-CM | POA: Insufficient documentation

## 2017-07-20 DIAGNOSIS — O26872 Cervical shortening, second trimester: Secondary | ICD-10-CM | POA: Diagnosis present

## 2017-07-20 DIAGNOSIS — Z3492 Encounter for supervision of normal pregnancy, unspecified, second trimester: Secondary | ICD-10-CM

## 2017-07-26 ENCOUNTER — Encounter: Payer: Self-pay | Admitting: Obstetrics

## 2017-07-26 ENCOUNTER — Ambulatory Visit (INDEPENDENT_AMBULATORY_CARE_PROVIDER_SITE_OTHER): Payer: Medicaid Other | Admitting: Obstetrics & Gynecology

## 2017-07-26 VITALS — BP 109/71 | HR 71 | Wt 145.0 lb

## 2017-07-26 DIAGNOSIS — Z3482 Encounter for supervision of other normal pregnancy, second trimester: Secondary | ICD-10-CM

## 2017-07-26 DIAGNOSIS — O26872 Cervical shortening, second trimester: Secondary | ICD-10-CM

## 2017-07-26 DIAGNOSIS — Z3480 Encounter for supervision of other normal pregnancy, unspecified trimester: Secondary | ICD-10-CM

## 2017-07-26 MED ORDER — PROGESTERONE MICRONIZED 200 MG PO CAPS: ORAL_CAPSULE | ORAL | 3 refills | Status: DC

## 2017-07-26 MED ORDER — ASPIRIN EC 81 MG PO TBEC: 81.0000 mg | DELAYED_RELEASE_TABLET | Freq: Every day | ORAL | 2 refills | Status: DC

## 2017-07-26 NOTE — Progress Notes (Signed)
PRENATAL VISIT NOTE  Subjective:  Christine Orozco is a 31 y.o. G3P1011 at [redacted]w[redacted]d being seen today for ongoing prenatal care.  She is currently monitored for the following issues for this low-risk pregnancy and has HGSIL on cytologic smear of cervix; Supervision of other normal pregnancy, antepartum; Rh negative status during pregnancy; Rubella non-immune status, antepartum; and Concern about short cervix during pregnancy in second trimester on her problem list.  Patient reports mild cramping.  Contractions: Irritability. Vag. Bleeding: None.  Movement: Present. Denies leaking of fluid.   The following portions of the patient's history were reviewed and updated as appropriate: allergies, current medications, past family history, past medical history, past social history, past surgical history and problem list. Problem list updated.  Objective:   Vitals:   07/26/17 0925  BP: 109/71  Pulse: 71  Weight: 145 lb (65.8 kg)    Fetal Status: Fetal Heart Rate (bpm): 150 Fundal Height: 21 cm Movement: Present     General:  Alert, oriented and cooperative. Patient is in no acute distress.  Skin: Skin is warm and dry. No rash noted.   Cardiovascular: Normal heart rate noted  Respiratory: Normal respiratory effort, no problems with respiration noted  Abdomen: Soft, gravid, appropriate for gestational age.  Pain/Pressure: Absent     Pelvic: Cervical exam deferred        Extremities: Normal range of motion.     Mental Status:  Normal mood and affect. Normal behavior. Normal judgment and thought content.   [place] Mfm Ob Transvaginal  Result Date: 07/20/2017 ----------------------------------------------------------------------  OBSTETRICS REPORT                      (Signed Final 07/20/2017 05:20 pm) ---------------------------------------------------------------------- Patient Info  ID #:       [id number]                          D.O.B.:  09-23-1986 (31 yrs)  Name:       Christine Orozco                  Visit Date: 07/20/2017 03:46 pm ---------------------------------------------------------------------- Performed By  Performed By:     Vivien Rota        Ref. Address:     Faculty                    RDMS  Attending:        Durwin Nora       Location:         [facility]                    MD  Referred By:      Catalina Antigua MD ---------------------------------------------------------------------- Orders   #  Description                                 Code   1  [place] MFM OB TRANSVAGINAL                      [id number]  ----------------------------------------------------------------------   #  Ordered By               Order #  Accession #    Episode #   1  PEGGY CONSTANT           [id number]      [id number]     [id number]  ---------------------------------------------------------------------- Indications   [redacted] weeks gestation of pregnancy                Z3A.20   Cervical shortening, second trimester          O26.872   Encounter for cervical length                  Z36.86  ---------------------------------------------------------------------- OB History  Blood Type:            Height:  5'3"   Weight (lb):  138       BMI:  24.44  Gravidity:    3         Term:   1        Prem:   0        SAB:   1  TOP:          0       Ectopic:  0        Living: 1 ---------------------------------------------------------------------- Fetal Evaluation  Num Of Fetuses:     1  Fetal Heart         150  Rate(bpm):  Cardiac Activity:   Observed  Presentation:       Breech  Amniotic Fluid  AFI FV:      Subjectively within normal limits                              Largest Pocket(cm)                              5.67 ---------------------------------------------------------------------- Gestational Age  LMP:           20w 3d        Date:  02/27/17                 EDD:   12/04/17  Best:          Cherylann Parr 3d     Det. By:  LMP  (02/27/17)          EDD:   12/04/17  ---------------------------------------------------------------------- Cervix Uterus Adnexa  Cervix  Length:            2.6  cm.  Measured transvaginally. ---------------------------------------------------------------------- Impression  Singleton intrauterine pregnancy at 20 weeks 3 day gestation  with prior hx of SPTB  Breech presentation  Anterior placenta without evidence of previa  Normal appearing amniotic fluid volume  Cervical length of 2.6 cm ---------------------------------------------------------------------- Recommendations  Given hx of preterm birth and low normal functional cervical  length, I recommend reassessment of cervical length  between 22 0/7 and 23 6/[redacted] weeks gestational age. ----------------------------------------------------------------------               Tori Milks, MD Electronically Signed Final Report   07/20/2017 05:20 pm ----------------------------------------------------------------------  [place] Mfm Ob Transvaginal  Result Date: 07/11/2017 ----------------------------------------------------------------------  OBSTETRICS REPORT                      (Signed Final 07/11/2017 11:45 am) ---------------------------------------------------------------------- Patient Info  ID #:       [id number]  D.O.B.:  1986-01-31 (31 yrs)  Name:       Christine Orozco                 Visit Date: 07/11/2017 11:29 am ---------------------------------------------------------------------- Performed By  Performed By:     Tommi Emery         Ref. Address:     Faculty                    RDMS  Attending:        Ledon Snare MD         Location:         [facility]  Referred By:      Catalina Antigua MD ---------------------------------------------------------------------- Orders   #  Description                                 Code   1  [place] MFM OB COMP + 14 WK                      76805.01   2  [place] MFM OB TRANSVAGINAL                      16109.6   ----------------------------------------------------------------------   #  Ordered By               Order #        Accession #    Episode #   1  PEGGY CONSTANT           [id number]      [id number]     [id number]   2  PEGGY CONSTANT           [id number]      [id number]     [id number]  ---------------------------------------------------------------------- Indications   [redacted] weeks gestation of pregnancy                Z3A.19   Encounter for antenatal screening for          Z36.3   malformations   Cervical shortening, second trimester          O26.872  ---------------------------------------------------------------------- OB History  Blood Type:            Height:  5'3"   Weight (lb):  138       BMI:  24.44  Gravidity:    3         Term:   1        Prem:   0        SAB:   1  TOP:          0       Ectopic:  0        Living: 1 ---------------------------------------------------------------------- Fetal Evaluation  Num Of Fetuses:     1  Fetal Heart         154  Rate(bpm):  Cardiac Activity:   Observed  Presentation:       Transverse, head to maternal left  Placenta:           Anterior, above cervical os  P. Cord Insertion:  Visualized  Amniotic Fluid  AFI FV:      Subjectively within normal limits  Largest Pocket(cm)                              4.26 ---------------------------------------------------------------------- Biometry  BPD:      43.4  mm     G. Age:  19w 1d         50  %    CI:        78.17   %    70 - 86                                                          FL/HC:      17.5   %    16.1 - 18.3  HC:      155.3  mm     G. Age:  18w 3d         15  %    HC/AC:      1.12        1.09 - 1.39  AC:      139.1  mm     G. Age:  19w 2d         51  %    FL/BPD:     62.7   %  FL:       27.2  mm     G. Age:  18w 2d         17  %    FL/AC:      19.6   %    20 - 24  CER:      21.6  mm     G. Age:  20w 4d         81  %  CM:          4  mm  Est. FW:     259  gm      0 lb 9 oz     39  %  ---------------------------------------------------------------------- Gestational Age  LMP:           19w 1d        Date:  02/27/17                 EDD:   12/04/17  U/S Today:     18w 6d                                        EDD:   12/06/17  Best:          19w 1d     Det. By:  LMP  (02/27/17)          EDD:   12/04/17 ---------------------------------------------------------------------- Anatomy  Cranium:               Appears normal         Aortic Arch:            Appears normal  Cavum:                 Appears normal         Ductal Arch:            Appears normal  Ventricles:  Appears normal         Diaphragm:              Appears normal  Choroid Plexus:        Appears normal         Stomach:                Appears normal, left                                                                        sided  Cerebellum:            Appears normal         Abdomen:                Appears normal  Posterior Fossa:       Appears normal         Abdominal Wall:         Appears nml (cord                                                                        insert, abd wall)  Nuchal Fold:           Appears normal         Cord Vessels:           Appears normal (3                                                                        vessel cord)  Face:                  Appears normal         Kidneys:                Appear normal                         (orbits and profile)  Lips:                  Appears normal         Bladder:                Appears normal  Thoracic:              Appears normal         Spine:                  Not well visualized  Heart:                 Appears normal         Upper Extremities:      Appears normal                         (  4CH, axis, and situs  RVOT:                  Appears normal         Lower Extremities:      Appears normal  LVOT:                  Appears normal  Other:  Female gender. Heels and 5th digit visualized.  ---------------------------------------------------------------------- Cervix Uterus Adnexa  Cervix  Length:            2.5  cm.  Measured transvaginally.  Uterus  No abnormality visualized.  Left Ovary  Not visualized. No adnexal mass visualized.  Right Ovary  Not visualized. No adnexal mass visualized.  Cul De Sac:   No free fluid seen.  Adnexa:       No abnormality visualized. ---------------------------------------------------------------------- Impression  Singleton intrauterine pregnancy at 19 weeks 1 day gestation  with fetal cardiac activity  Transverse presentation  Anterior placenta without evidence of previa  Normal appearing fetal growth and amniotic fluid volume  Limited fetal anatomic survey today secondary to fetal position  Cervical length of 2.5 cm ---------------------------------------------------------------------- Recommendations  Repeat cervical length in one week ----------------------------------------------------------------------                   Ledon Snare, MD Electronically Signed Final Report   07/11/2017 11:45 am ----------------------------------------------------------------------  [place] Mfm Ob Comp + 14 Wk  Result Date: 07/11/2017 ----------------------------------------------------------------------  OBSTETRICS REPORT                      (Signed Final 07/11/2017 11:45 am) ---------------------------------------------------------------------- Patient Info  ID #:       [id number]                          D.O.B.:  [birth year] (31 yrs)  Name:       Christine Orozco                 Visit Date: 07/11/2017 11:29 am ---------------------------------------------------------------------- Performed By  Performed By:     Tommi Emery         Ref. Address:     Faculty                    RDMS  Attending:        Ledon Snare MD         Location:         [facility]  Referred By:      Catalina Antigua MD ----------------------------------------------------------------------  Orders   #  Description                                 Code   1  [place] MFM OB COMP + 14 WK                      76805.01   2  [place] MFM OB TRANSVAGINAL                      13244.0  ----------------------------------------------------------------------   #  Ordered By               Order #        Accession #  Episode #   1  PEGGY CONSTANT           [id number]      [id number]     [id number]   2  PEGGY CONSTANT           [id number]      [id number]     [id number]  ---------------------------------------------------------------------- Indications   [redacted] weeks gestation of pregnancy                Z3A.19   Encounter for antenatal screening for          Z36.3   malformations   Cervical shortening, second trimester          O26.872  ---------------------------------------------------------------------- OB History  Blood Type:            Height:  5'3"   Weight (lb):  138       BMI:  24.44  Gravidity:    3         Term:   1        Prem:   0        SAB:   1  TOP:          0       Ectopic:  0        Living: 1 ---------------------------------------------------------------------- Fetal Evaluation  Num Of Fetuses:     1  Fetal Heart         154  Rate(bpm):  Cardiac Activity:   Observed  Presentation:       Transverse, head to maternal left  Placenta:           Anterior, above cervical os  P. Cord Insertion:  Visualized  Amniotic Fluid  AFI FV:      Subjectively within normal limits                              Largest Pocket(cm)                              4.26 ---------------------------------------------------------------------- Biometry  BPD:      43.4  mm     G. Age:  19w 1d         50  %    CI:        78.17   %    70 - 86                                                          FL/HC:      17.5   %    16.1 - 18.3  HC:      155.3  mm     G. Age:  18w 3d         15  %    HC/AC:      1.12        1.09 - 1.39  AC:      139.1  mm     G. Age:  19w 2d         51  %    FL/BPD:     62.7   %  FL:       27.2  mm  G. Age:  18w 2d         17   %    FL/AC:      19.6   %    20 - 24  CER:      21.6  mm     G. Age:  20w 4d         81  %  CM:          4  mm  Est. FW:     259  gm      0 lb 9 oz     39  % ---------------------------------------------------------------------- Gestational Age  LMP:           19w 1d        Date:  02/27/17                 EDD:   12/04/17  U/S Today:     18w 6d                                        EDD:   12/06/17  Best:          19w 1d     Det. By:  LMP  (02/27/17)          EDD:   12/04/17 ---------------------------------------------------------------------- Anatomy  Cranium:               Appears normal         Aortic Arch:            Appears normal  Cavum:                 Appears normal         Ductal Arch:            Appears normal  Ventricles:            Appears normal         Diaphragm:              Appears normal  Choroid Plexus:        Appears normal         Stomach:                Appears normal, left                                                                        sided  Cerebellum:            Appears normal         Abdomen:                Appears normal  Posterior Fossa:       Appears normal         Abdominal Wall:         Appears nml (cord  insert, abd wall)  Nuchal Fold:           Appears normal         Cord Vessels:           Appears normal (3                                                                        vessel cord)  Face:                  Appears normal         Kidneys:                Appear normal                         (orbits and profile)  Lips:                  Appears normal         Bladder:                Appears normal  Thoracic:              Appears normal         Spine:                  Not well visualized  Heart:                 Appears normal         Upper Extremities:      Appears normal                         (4CH, axis, and situs  RVOT:                  Appears normal         Lower Extremities:      Appears normal   LVOT:                  Appears normal  Other:  Female gender. Heels and 5th digit visualized. ---------------------------------------------------------------------- Cervix Uterus Adnexa  Cervix  Length:            2.5  cm.  Measured transvaginally.  Uterus  No abnormality visualized.  Left Ovary  Not visualized. No adnexal mass visualized.  Right Ovary  Not visualized. No adnexal mass visualized.  Cul De Sac:   No free fluid seen.  Adnexa:       No abnormality visualized. ---------------------------------------------------------------------- Impression  Singleton intrauterine pregnancy at 19 weeks 1 day gestation  with fetal cardiac activity  Transverse presentation  Anterior placenta without evidence of previa  Normal appearing fetal growth and amniotic fluid volume  Limited fetal anatomic survey today secondary to fetal position  Cervical length of 2.5 cm ---------------------------------------------------------------------- Recommendations  Repeat cervical length in one week ----------------------------------------------------------------------                   Ledon Snare, MD Electronically Signed Final Report   07/11/2017 11:45 am ----------------------------------------------------------------------   Assessment and Plan:  Pregnancy: G3P1011 at [redacted]w[redacted]d  1. Concern about short cervix during pregnancy in  second trimester On 07/20/17, was 2.6 cm; this was followup from anatomy scan on 07/11/17 where it was 2.5 cm. No intervention thus far. Rescan ordered, will follow up results and manage accordingly. Discussed possible benefits of  vaginal progesterone and ASA 81 mg; she wants to be on these medications. They were prescribed.  - [place] MFM OB Transvaginal; Future - aspirin EC 81 MG tablet; Take 1 tablet (81 mg total) by mouth daily. Take after 12 weeks for prevention of preeclampsia later in pregnancy  Dispense: 300 tablet; Refill: 2 - progesterone (PROMETRIUM) 200 MG capsule; Place one capsule vaginally at  bedtime  Dispense: 30 capsule; Refill: 3  2. Supervision of other normal pregnancy, antepartum Preterm labor symptoms and general obstetric precautions including but not limited to vaginal bleeding, contractions, leaking of fluid and fetal movement were reviewed in detail with the patient. Please refer to After Visit Summary for other counseling recommendations.  Return in about 4 weeks (around 08/23/2017) for OB Visit.   Jaynie Collins, MD

## 2017-07-26 NOTE — Patient Instructions (Signed)
Return to clinic for any scheduled appointments or obstetric concerns, or go to [facility] for evaluation  

## 2017-08-02 ENCOUNTER — Ambulatory Visit (HOSPITAL_COMMUNITY)
Admission: RE | Admit: 2017-08-02 | Discharge: 2017-08-02 | Disposition: A | Discharge status: Home / Self Care | Payer: Medicaid Other | Source: Ambulatory Visit | Attending: Obstetrics & Gynecology | Admitting: Obstetrics & Gynecology

## 2017-08-02 DIAGNOSIS — O321XX0 Maternal care for breech presentation, not applicable or unspecified: Secondary | ICD-10-CM | POA: Insufficient documentation

## 2017-08-02 DIAGNOSIS — Z3686 Encounter for antenatal screening for cervical length: Secondary | ICD-10-CM | POA: Insufficient documentation

## 2017-08-02 DIAGNOSIS — Z3A22 22 weeks gestation of pregnancy: Secondary | ICD-10-CM | POA: Insufficient documentation

## 2017-08-02 DIAGNOSIS — O26872 Cervical shortening, second trimester: Secondary | ICD-10-CM | POA: Insufficient documentation

## 2017-08-03 ENCOUNTER — Other Ambulatory Visit (HOSPITAL_COMMUNITY): Payer: Self-pay | Admitting: *Deleted

## 2017-08-03 DIAGNOSIS — Z3686 Encounter for antenatal screening for cervical length: Secondary | ICD-10-CM

## 2017-08-09 ENCOUNTER — Ambulatory Visit (HOSPITAL_COMMUNITY)
Admission: RE | Admit: 2017-08-09 | Discharge: 2017-08-09 | Disposition: A | Discharge status: Home / Self Care | Payer: Medicaid Other | Source: Ambulatory Visit | Attending: Obstetrics & Gynecology | Admitting: Obstetrics & Gynecology

## 2017-08-09 ENCOUNTER — Other Ambulatory Visit (HOSPITAL_COMMUNITY): Payer: Self-pay | Admitting: Maternal & Fetal Medicine

## 2017-08-09 DIAGNOSIS — Z3686 Encounter for antenatal screening for cervical length: Secondary | ICD-10-CM | POA: Insufficient documentation

## 2017-08-09 DIAGNOSIS — Z3A23 23 weeks gestation of pregnancy: Secondary | ICD-10-CM

## 2017-08-09 DIAGNOSIS — O26872 Cervical shortening, second trimester: Secondary | ICD-10-CM | POA: Diagnosis not present

## 2017-08-10 ENCOUNTER — Other Ambulatory Visit (HOSPITAL_COMMUNITY): Payer: Self-pay | Admitting: *Deleted

## 2017-08-10 DIAGNOSIS — O26879 Cervical shortening, unspecified trimester: Secondary | ICD-10-CM

## 2017-08-23 ENCOUNTER — Ambulatory Visit (HOSPITAL_COMMUNITY)
Admission: RE | Admit: 2017-08-23 | Discharge: 2017-08-23 | Disposition: A | Discharge status: Home / Self Care | Payer: Medicaid Other | Source: Ambulatory Visit

## 2017-08-23 ENCOUNTER — Ambulatory Visit (INDEPENDENT_AMBULATORY_CARE_PROVIDER_SITE_OTHER): Payer: Medicaid Other | Admitting: Obstetrics & Gynecology

## 2017-08-23 ENCOUNTER — Encounter (HOSPITAL_COMMUNITY): Payer: Self-pay

## 2017-08-23 ENCOUNTER — Other Ambulatory Visit: Payer: Self-pay

## 2017-08-23 DIAGNOSIS — O26872 Cervical shortening, second trimester: Secondary | ICD-10-CM

## 2017-08-23 DIAGNOSIS — Z3480 Encounter for supervision of other normal pregnancy, unspecified trimester: Secondary | ICD-10-CM

## 2017-08-23 MED ORDER — PANTOPRAZOLE SODIUM 20 MG PO TBEC: 20.0000 mg | DELAYED_RELEASE_TABLET | Freq: Every day | ORAL | 2 refills | Status: DC

## 2017-08-23 MED ORDER — MOMETASONE FUROATE 50 MCG/ACT NA SUSP: 2.0000 | Freq: Every day | NASAL | 0 refills | Status: DC

## 2017-08-23 MED ORDER — BENZONATATE 100 MG PO CAPS: 100.0000 mg | ORAL_CAPSULE | Freq: Three times a day (TID) | ORAL | 0 refills | Status: DC | PRN

## 2017-08-23 NOTE — Progress Notes (Signed)
   PRENATAL VISIT NOTE  Subjective:  Christine Orozco is a 31 y.o. G3P1011 at 2126w2d being seen today for ongoing prenatal care.  She is currently monitored for the following issues for this high-risk pregnancy and has HGSIL on cytologic smear of cervix; Supervision of other normal pregnancy, antepartum; Rh negative status during pregnancy; Rubella non-immune status, antepartum; and Concern about short cervix during pregnancy in second trimester on their problem list.  Patient reports nasal congestion cough and heartburn.  Contractions: Not present. Vag. Bleeding: None.  Movement: Present. Denies leaking of fluid.   The following portions of the patient's history were reviewed and updated as appropriate: allergies, current medications, past family history, past medical history, past social history, past surgical history and problem list. Problem list updated.  Objective:  There were no vitals filed for this visit.  Fetal Status: Fetal Heart Rate (bpm): 140   Movement: Present     General:  Alert, oriented and cooperative. Patient is in no acute distress.  Skin: Skin is warm and dry. No rash noted.   Cardiovascular: Normal heart rate noted  Respiratory: Normal respiratory effort, no problems with respiration noted, CTA  Abdomen: Soft, gravid, appropriate for gestational age.  Pain/Pressure: Absent     Pelvic: Cervical exam deferred        Extremities: Normal range of motion.  Edema: Trace  Mental Status:  Normal mood and affect. Normal behavior. Normal judgment and thought content.   Assessment and Plan:  Pregnancy: G3P1011 at 3726w2d  1. Concern about short cervix during pregnancy in second trimester US is scheduled  2. Supervision of other normal pregnancy, antepartum Persistent cough and multiple allergies, heartburn- pantoprazole (PROTONIX) 20 MG tablet; Take 1 tablet (20 mg total) by mouth daily.  Dispense: 30 tablet; Refill: 2 - mometasone (NASONEX) 50 MCG/ACT nasal spray; Place 2  sprays into the nose daily.  Dispense: 17 g; Refill: 0 - benzonatate (TESSALON PERLES) 100 MG capsule; Take 1 capsule (100 mg total) by mouth 3 (three) times daily as needed for cough.  Dispense: 20 capsule; Refill: 0  Preterm labor symptoms and general obstetric precautions including but not limited to vaginal bleeding, contractions, leaking of fluid and fetal movement were reviewed in detail with the patient. Please refer to After Visit Summary for other counseling recommendations.  Return in about 3 weeks (around 09/13/2017) for 2 hr GTT.   Scheryl DarterJames Lealon Vanputten, MD

## 2017-08-23 NOTE — Patient Instructions (Signed)
Second Trimester of Pregnancy The second trimester is from week 13 through week 28, month 4 through 6. This is often the time in pregnancy that you feel your best. Often times, morning sickness has lessened or quit. You may have more energy, and you may get hungry more often. Your unborn baby (fetus) is growing rapidly. At the end of the sixth month, he or she is about 9 inches long and weighs about 1 pounds. You will likely feel the baby move (quickening) between 18 and 20 weeks of pregnancy. Follow these instructions at home:  Avoid all smoking, herbs, and alcohol. Avoid drugs not approved by your doctor.  Do not use any tobacco products, including cigarettes, chewing tobacco, and electronic cigarettes. If you need help quitting, ask your doctor. You may get counseling or other support to help you quit.  Only take medicine as told by your doctor. Some medicines are safe and some are not during pregnancy.  Exercise only as told by your doctor. Stop exercising if you start having cramps.  Eat regular, healthy meals.  Wear a good support bra if your breasts are tender.  Do not use hot tubs, steam rooms, or saunas.  Wear your seat belt when driving.  Avoid raw meat, uncooked cheese, and liter boxes and soil used by cats.  Take your prenatal vitamins.  Take 1500-2000 milligrams of calcium daily starting at the 20th week of pregnancy until you deliver your baby.  Try taking medicine that helps you poop (stool softener) as needed, and if your doctor approves. Eat more fiber by eating fresh fruit, vegetables, and whole grains. Drink enough fluids to keep your pee (urine) clear or pale yellow.  Take warm water baths (sitz baths) to soothe pain or discomfort caused by hemorrhoids. Use hemorrhoid cream if your doctor approves.  If you have puffy, bulging veins (varicose veins), wear support hose. Raise (elevate) your feet for 15 minutes, 3-4 times a day. Limit salt in your diet.  Avoid heavy  lifting, wear low heals, and sit up straight.  Rest with your legs raised if you have leg cramps or low back pain.  Visit your dentist if you have not gone during your pregnancy. Use a soft toothbrush to brush your teeth. Be gentle when you floss.  You can have sex (intercourse) unless your doctor tells you not to.  Go to your doctor visits. Get help if:  You feel dizzy.  You have mild cramps or pressure in your lower belly (abdomen).  You have a nagging pain in your belly area.  You continue to feel sick to your stomach (nauseous), throw up (vomit), or have watery poop (diarrhea).  You have bad smelling fluid coming from your vagina.  You have pain with peeing (urination). Get help right away if:  You have a fever.  You are leaking fluid from your vagina.  You have spotting or bleeding from your vagina.  You have severe belly cramping or pain.  You lose or gain weight rapidly.  You have trouble catching your breath and have chest pain.  You notice sudden or extreme puffiness (swelling) of your face, hands, ankles, feet, or legs.  You have not felt the baby move in over an hour.  You have severe headaches that do not go away with medicine.  You have vision changes. This information is not intended to replace advice given to you by your health care provider. Make sure you discuss any questions you have with your health care   provider. Document Released: 12/14/2009 Document Revised: 02/25/2016 Document Reviewed: 11/20/2012 [employer]vier Interactive Patient Education  2017 [employer]  

## 2017-08-23 NOTE — Progress Notes (Signed)
Complains of coughing with chest pains x 14 days 6/10, she cough so hard she vomits sometimes. She is unable to take Benadryl, Zyrtec, Claritin and Allegra.

## 2017-08-29 ENCOUNTER — Encounter: Payer: Self-pay | Admitting: Obstetrics and Gynecology

## 2017-08-29 ENCOUNTER — Ambulatory Visit (INDEPENDENT_AMBULATORY_CARE_PROVIDER_SITE_OTHER): Payer: Medicaid Other | Admitting: Obstetrics and Gynecology

## 2017-08-29 ENCOUNTER — Encounter: Payer: Self-pay | Admitting: Obstetrics & Gynecology

## 2017-08-29 ENCOUNTER — Telehealth: Payer: Self-pay

## 2017-08-29 VITALS — BP 133/68 | HR 92 | Wt 154.0 lb

## 2017-08-29 DIAGNOSIS — Z6791 Unspecified blood type, Rh negative: Secondary | ICD-10-CM

## 2017-08-29 DIAGNOSIS — M549 Dorsalgia, unspecified: Secondary | ICD-10-CM

## 2017-08-29 DIAGNOSIS — Z283 Underimmunization status: Secondary | ICD-10-CM

## 2017-08-29 DIAGNOSIS — O26892 Other specified pregnancy related conditions, second trimester: Secondary | ICD-10-CM

## 2017-08-29 DIAGNOSIS — Z3480 Encounter for supervision of other normal pregnancy, unspecified trimester: Secondary | ICD-10-CM

## 2017-08-29 DIAGNOSIS — O09892 Supervision of other high risk pregnancies, second trimester: Secondary | ICD-10-CM

## 2017-08-29 DIAGNOSIS — O9989 Other specified diseases and conditions complicating pregnancy, childbirth and the puerperium: Secondary | ICD-10-CM

## 2017-08-29 DIAGNOSIS — O09899 Supervision of other high risk pregnancies, unspecified trimester: Secondary | ICD-10-CM

## 2017-08-29 DIAGNOSIS — O99891 Other specified diseases and conditions complicating pregnancy: Secondary | ICD-10-CM

## 2017-08-29 DIAGNOSIS — O26872 Cervical shortening, second trimester: Secondary | ICD-10-CM

## 2017-08-29 LAB — POCT URINALYSIS DIPSTICK
Bilirubin, UA: NEGATIVE
Glucose, UA: NEGATIVE
KETONES UA: NEGATIVE
Leukocytes, UA: NEGATIVE
Nitrite, UA: NEGATIVE
SPEC GRAV UA: 1.015 (ref 1.010–1.025)
UROBILINOGEN UA: NEGATIVE U/dL — AB
pH, UA: 6.5 (ref 5.0–8.0)

## 2017-08-29 NOTE — Patient Instructions (Signed)
For colds and allergies  Any anti-histamine including benadryl, allegra, claritin, etc.  Sudafed but not phenylephrine  Mucinex  Robitussin  For Reflux/heartburn  Pepcid Zantac Tums Prilosec Prevacid  For yeast infections  Monistat  For constipation  Colace  For minor aches and pains  Tylenol-do not take more than 4000mg in 24 hours. Therma-care or like heat packs  

## 2017-08-29 NOTE — Progress Notes (Signed)
C/O: Back Pain and having a possible UTI. Pt took Tylenol for pain no relief.

## 2017-08-29 NOTE — Telephone Encounter (Signed)
Left mess on VM to call us.

## 2017-08-29 NOTE — Progress Notes (Signed)
   PRENATAL VISIT NOTE  Subjective:  Christine Orozco is a 31 y.o. G3P1011 at 67w1dbeing seen today for ongoing prenatal care.  She is currently monitored for the following issues for this high-risk pregnancy and has HGSIL on cytologic smear of cervix; Supervision of other normal pregnancy, antepartum; Rh negative status during pregnancy; Rubella non-immune status, antepartum; and Concern about short cervix during pregnancy in second trimester on their problem list.  Patient reports backache and coughing. States she is still coughing from illness, has improved slightly but not much. Also with right sided back pain, states she thinks she may have UTI or kidney stone, pain kept her up last night, did not improve with tylenol.  Contractions: Not present. Vag. Bleeding: None.  Movement: Present. Denies leaking of fluid.   The following portions of the patient's history were reviewed and updated as appropriate: allergies, current medications, past family history, past medical history, past social history, past surgical history and problem list. Problem list updated.  Objective:   Vitals:   08/29/17 1538  BP: 133/68  Pulse: 92  Weight: 154 lb (69.9 kg)    Fetal Status:     Movement: Present     General:  Alert, oriented and cooperative. Patient is in no acute distress.  Skin: Skin is warm and dry. No rash noted.   Cardiovascular: Normal heart rate noted  Respiratory: Normal respiratory effort, no problems with respiration noted  Abdomen: Soft, gravid, appropriate for gestational age.  Pain/Pressure: Absent  Right mid back non-tender    Pelvic: Cervical exam deferred        Extremities: Normal range of motion.  Edema: Trace  Mental Status:  Normal mood and affect. Normal behavior. Normal judgment and thought content.   Assessment and Plan:  Pregnancy: G3P1011 at 246w1d1. Supervision of other normal pregnancy, antepartum  2. Rh negative status during pregnancy in second trimester Rho gam  28 weeks  3. Rubella non-immune status, antepartum Needs MMR post partum  4. Concern about short cervix during pregnancy in second trimester No show for last TVUS Rescheduled today Cont progesterone  5. Back pain - udip with trace protein, blood - will send for culture - reviewed appropriate pain meds for pregnancy - reviewed precautions - to call with any issues   Preterm labor symptoms and general obstetric precautions including but not limited to vaginal bleeding, contractions, leaking of fluid and fetal movement were reviewed in detail with the patient. Please refer to After Visit Summary for other counseling recommendations.  Return in about 2 weeks (around 09/12/2017) for OB visit.   KeSloan LeiterMD

## 2017-08-31 LAB — URINE CULTURE

## 2017-09-13 ENCOUNTER — Encounter: Payer: Self-pay | Admitting: Obstetrics and Gynecology

## 2017-09-13 ENCOUNTER — Encounter: Payer: Medicaid Other | Admitting: Certified Nurse Midwife

## 2017-09-13 ENCOUNTER — Other Ambulatory Visit: Payer: Medicaid Other

## 2017-09-13 ENCOUNTER — Ambulatory Visit (INDEPENDENT_AMBULATORY_CARE_PROVIDER_SITE_OTHER): Payer: Medicaid Other | Admitting: Obstetrics and Gynecology

## 2017-09-13 ENCOUNTER — Encounter (HOSPITAL_COMMUNITY): Payer: Self-pay

## 2017-09-13 ENCOUNTER — Other Ambulatory Visit (HOSPITAL_COMMUNITY): Payer: Self-pay | Admitting: Maternal and Fetal Medicine

## 2017-09-13 ENCOUNTER — Ambulatory Visit (HOSPITAL_COMMUNITY)
Admission: RE | Admit: 2017-09-13 | Discharge: 2017-09-13 | Disposition: A | Discharge status: Home / Self Care | Payer: Medicaid Other | Source: Ambulatory Visit | Attending: Certified Nurse Midwife | Admitting: Certified Nurse Midwife

## 2017-09-13 VITALS — BP 119/77 | HR 89 | Wt 155.2 lb

## 2017-09-13 DIAGNOSIS — O09892 Supervision of other high risk pregnancies, second trimester: Secondary | ICD-10-CM

## 2017-09-13 DIAGNOSIS — O26879 Cervical shortening, unspecified trimester: Secondary | ICD-10-CM

## 2017-09-13 DIAGNOSIS — Z283 Underimmunization status: Secondary | ICD-10-CM

## 2017-09-13 DIAGNOSIS — O26873 Cervical shortening, third trimester: Secondary | ICD-10-CM | POA: Diagnosis present

## 2017-09-13 DIAGNOSIS — O09899 Supervision of other high risk pregnancies, unspecified trimester: Secondary | ICD-10-CM

## 2017-09-13 DIAGNOSIS — Z23 Encounter for immunization: Secondary | ICD-10-CM | POA: Diagnosis not present

## 2017-09-13 DIAGNOSIS — O26872 Cervical shortening, second trimester: Secondary | ICD-10-CM

## 2017-09-13 DIAGNOSIS — Z3A28 28 weeks gestation of pregnancy: Secondary | ICD-10-CM | POA: Diagnosis not present

## 2017-09-13 DIAGNOSIS — O9989 Other specified diseases and conditions complicating pregnancy, childbirth and the puerperium: Secondary | ICD-10-CM

## 2017-09-13 DIAGNOSIS — Z3480 Encounter for supervision of other normal pregnancy, unspecified trimester: Secondary | ICD-10-CM

## 2017-09-13 DIAGNOSIS — O09893 Supervision of other high risk pregnancies, third trimester: Secondary | ICD-10-CM | POA: Insufficient documentation

## 2017-09-13 DIAGNOSIS — O26892 Other specified pregnancy related conditions, second trimester: Secondary | ICD-10-CM

## 2017-09-13 DIAGNOSIS — Z6791 Unspecified blood type, Rh negative: Secondary | ICD-10-CM

## 2017-09-13 MED ORDER — RHO D IMMUNE GLOBULIN 1500 UNIT/2ML IJ SOSY
300.0000 ug | PREFILLED_SYRINGE | Freq: Once | INTRAMUSCULAR | Status: AC
  Administered 2017-09-13: 300 ug via INTRAMUSCULAR

## 2017-09-13 NOTE — Progress Notes (Signed)
Subjective:  Christine Orozco is a 31 y.o. G3P1011 at 1447w2d being seen today for ongoing prenatal care.  She is currently monitored for the following issues for this high-risk pregnancy and has HGSIL on cytologic smear of cervix; Supervision of other normal pregnancy, antepartum; Rh negative status during pregnancy; Rubella non-immune status, antepartum; and Concern about short cervix during pregnancy in second trimester on their problem list.  Patient reports no complaints.  Contractions: Not present. Vag. Bleeding: None.  Movement: Present. Denies leaking of fluid.   The following portions of the patient's history were reviewed and updated as appropriate: allergies, current medications, past family history, past medical history, past social history, past surgical history and problem list. Problem list updated.  Objective:   Vitals:   09/13/17 0825  BP: 119/77  Pulse: 89  Weight: 155 lb 3.2 oz (70.4 kg)    Fetal Status:     Movement: Present     General:  Alert, oriented and cooperative. Patient is in no acute distress.  Skin: Skin is warm and dry. No rash noted.   Cardiovascular: Normal heart rate noted  Respiratory: Normal respiratory effort, no problems with respiration noted  Abdomen: Soft, gravid, appropriate for gestational age. Pain/Pressure: Absent     Pelvic:  Cervical exam deferred        Extremities: Normal range of motion.  Edema: Trace  Mental Status: Normal mood and affect. Normal behavior. Normal judgment and thought content.   Urinalysis:      Assessment and Plan:  Pregnancy: G3P1011 at 1347w2d  1. Supervision of other normal pregnancy, antepartum Stable Tdap today glucola today  2. Concern about short cervix during pregnancy in second trimester CL today Last CL 2.2 cm Continue with vaginal progesterone No S/Sx of PTL today  3. Rh negative status during pregnancy in second trimester Rhogam today  4. Rubella non-immune status, antepartum Vaccine  PP  Preterm labor symptoms and general obstetric precautions including but not limited to vaginal bleeding, contractions, leaking of fluid and fetal movement were reviewed in detail with the patient. Please refer to After Visit Summary for other counseling recommendations.  Return in about 2 weeks (around 09/27/2017) for OB visit.   Hermina StaggersErvin, Michael L, MD

## 2017-09-13 NOTE — Progress Notes (Signed)
Patient reports good fetal movement, denies pain. 

## 2017-09-13 NOTE — Patient Instructions (Signed)

## 2017-09-13 NOTE — Addendum Note (Signed)
Encounter addended by: Marcellina MillinMoser, Tim Wilhide L, RTR on: 09/13/2017 4:57 PM  Actions taken: Imaging Exam ended

## 2017-09-13 NOTE — Addendum Note (Signed)
Addended by: Natale MilchSTALLING, Adlai Nieblas D on: 09/13/2017 09:03 AM   Modules accepted: Orders

## 2017-09-14 LAB — CBC
Hematocrit: 36.8 % (ref 34.0–46.6)
Hemoglobin: 12 g/dL (ref 11.1–15.9)
MCH: 30.5 pg (ref 26.6–33.0)
MCHC: 32.6 g/dL (ref 31.5–35.7)
MCV: 94 fL (ref 79–97)
PLATELETS: 235 10*3/uL (ref 150–379)
RBC: 3.93 x10E6/uL (ref 3.77–5.28)
RDW: 13.1 % (ref 12.3–15.4)
WBC: 14.3 10*3/uL — AB (ref 3.4–10.8)

## 2017-09-14 LAB — HIV ANTIBODY (ROUTINE TESTING W REFLEX): HIV SCREEN 4TH GENERATION: NONREACTIVE

## 2017-09-14 LAB — GLUCOSE TOLERANCE, 2 HOURS W/ 1HR
GLUCOSE, 1 HOUR: 138 mg/dL (ref 65–179)
GLUCOSE, 2 HOUR: 80 mg/dL (ref 65–152)
Glucose, Fasting: 84 mg/dL (ref 65–91)

## 2017-09-14 LAB — RPR: RPR: NONREACTIVE

## 2017-09-28 ENCOUNTER — Encounter: Payer: Self-pay | Admitting: Obstetrics & Gynecology

## 2017-09-28 ENCOUNTER — Ambulatory Visit (INDEPENDENT_AMBULATORY_CARE_PROVIDER_SITE_OTHER): Payer: Medicaid Other | Admitting: Obstetrics & Gynecology

## 2017-09-28 DIAGNOSIS — Z3480 Encounter for supervision of other normal pregnancy, unspecified trimester: Secondary | ICD-10-CM

## 2017-09-28 DIAGNOSIS — Z3483 Encounter for supervision of other normal pregnancy, third trimester: Secondary | ICD-10-CM

## 2017-09-28 NOTE — Patient Instructions (Signed)

## 2017-09-28 NOTE — Progress Notes (Signed)
Nausea and vomiting for about 12 hr, others have also been ill   PRENATAL VISIT NOTE  Subjective:  Christine Orozco is a 31 y.o. G3P1011 at 2244w3d being seen today for ongoing prenatal care.  She is currently monitored for the following issues for this low-risk pregnancy and has Supervision of other normal pregnancy, antepartum; Rh negative status during pregnancy; Rubella non-immune status, antepartum; and Concern about short cervix during pregnancy in second trimester on their problem list.  Patient reports nausea, vomiting and no fever.  Contractions: Not present. Vag. Bleeding: None.  Movement: Present. Denies leaking of fluid.   The following portions of the patient's history were reviewed and updated as appropriate: allergies, current medications, past family history, past medical history, past social history, past surgical history and problem list. Problem list updated.  Objective:   Vitals:   09/28/17 1326  BP: 130/81  Pulse: (!) 103  Weight: 160 lb 4.8 oz (72.7 kg)    Fetal Status: Fetal Heart Rate (bpm): 130   Movement: Present     General:  Alert, oriented and cooperative. Patient is in no acute distress.  Skin: Skin is warm and dry. No rash noted.   Cardiovascular: Normal heart rate noted  Respiratory: Normal respiratory effort, no problems with respiration noted  Abdomen: Soft, gravid, appropriate for gestational age.  Pain/Pressure: Absent     Pelvic: Cervical exam deferred        Extremities: Normal range of motion.  Edema: Trace  Mental Status:  Normal mood and affect. Normal behavior. Normal judgment and thought content.   Assessment and Plan:  Pregnancy: G3P1011 at 7044w3d  1. Supervision of other normal pregnancy, antepartum Viral GI syndrome  Preterm labor symptoms and general obstetric precautions including but not limited to vaginal bleeding, contractions, leaking of fluid and fetal movement were reviewed in detail with the patient. Please refer to After Visit  Summary for other counseling recommendations.  Return in about 2 weeks (around 10/12/2017). Remain hydrated, report if sx worsen  Scheryl DarterJames Arnold, MD

## 2017-10-03 NOTE — L&D Delivery Note (Signed)
Patient is a 32 y.o. now G3P2 s/p NSVD at 3837w2d, who was admitted for SOL.  She progressed with augmentation (pitocin) to complete and pushed over 3 hours to deliver.  Cord clamping delayed by several minutes then clamped by CNM and cut by FOB.  She plans on breastfeeding. She requests Depo for birth control.  Delivery Note At 5:52 PM a viable female was delivered via Vaginal, Spontaneous (Presentation: OP ).  APGAR: 9, 9; weight pending .   Placenta intact and spontaneous, bleeding minimal. 3V Cord:  with no complications:   Anesthesia:  Epidural Episiotomy: None Lacerations: Labial- not repaired hemostatic  Suture Repair: none Est. Blood Loss (mL): 250  Mom and baby stable prior to transfer to postpartum Baby to Couplet care / Skin to Skin.  Sharyon CableVeronica C Nijah Orlich 11/22/2017, 7:13 PM

## 2017-10-06 ENCOUNTER — Other Ambulatory Visit: Payer: Self-pay

## 2017-10-06 ENCOUNTER — Inpatient Hospital Stay (HOSPITAL_COMMUNITY)
Admission: AD | Admit: 2017-10-06 | Discharge: 2017-10-06 | Disposition: A | Discharge status: Home / Self Care | Payer: Medicaid Other | Source: Ambulatory Visit | Attending: Obstetrics & Gynecology | Admitting: Obstetrics & Gynecology

## 2017-10-06 ENCOUNTER — Encounter: Payer: Self-pay | Admitting: Obstetrics & Gynecology

## 2017-10-06 ENCOUNTER — Encounter (HOSPITAL_COMMUNITY): Payer: Self-pay | Admitting: *Deleted

## 2017-10-06 DIAGNOSIS — O4703 False labor before 37 completed weeks of gestation, third trimester: Secondary | ICD-10-CM

## 2017-10-06 DIAGNOSIS — R519 Headache, unspecified: Secondary | ICD-10-CM

## 2017-10-06 DIAGNOSIS — O99613 Diseases of the digestive system complicating pregnancy, third trimester: Secondary | ICD-10-CM | POA: Diagnosis not present

## 2017-10-06 DIAGNOSIS — R51 Headache: Secondary | ICD-10-CM | POA: Diagnosis not present

## 2017-10-06 DIAGNOSIS — K219 Gastro-esophageal reflux disease without esophagitis: Secondary | ICD-10-CM | POA: Diagnosis not present

## 2017-10-06 DIAGNOSIS — O26893 Other specified pregnancy related conditions, third trimester: Secondary | ICD-10-CM | POA: Diagnosis not present

## 2017-10-06 DIAGNOSIS — Z3A31 31 weeks gestation of pregnancy: Secondary | ICD-10-CM | POA: Diagnosis not present

## 2017-10-06 DIAGNOSIS — R1013 Epigastric pain: Secondary | ICD-10-CM | POA: Diagnosis not present

## 2017-10-06 DIAGNOSIS — Z3689 Encounter for other specified antenatal screening: Secondary | ICD-10-CM

## 2017-10-06 DIAGNOSIS — Z87891 Personal history of nicotine dependence: Secondary | ICD-10-CM | POA: Diagnosis not present

## 2017-10-06 DIAGNOSIS — Z7982 Long term (current) use of aspirin: Secondary | ICD-10-CM | POA: Insufficient documentation

## 2017-10-06 LAB — URINALYSIS, ROUTINE W REFLEX MICROSCOPIC
BILIRUBIN URINE: NEGATIVE
GLUCOSE, UA: NEGATIVE mg/dL
HGB URINE DIPSTICK: NEGATIVE
Ketones, ur: NEGATIVE mg/dL
NITRITE: NEGATIVE
Protein, ur: NEGATIVE mg/dL
SPECIFIC GRAVITY, URINE: 1.028 (ref 1.005–1.030)
pH: 5 (ref 5.0–8.0)

## 2017-10-06 MED ORDER — ACETAMINOPHEN 500 MG PO TABS
1000.0000 mg | ORAL_TABLET | Freq: Once | ORAL | Status: AC
  Administered 2017-10-06: 1000 mg via ORAL
  Filled 2017-10-06: qty 2

## 2017-10-06 MED ORDER — GI COCKTAIL ~~LOC~~
30.0000 mL | Freq: Once | ORAL | Status: AC
  Administered 2017-10-06: 30 mL via ORAL
  Filled 2017-10-06: qty 30

## 2017-10-06 NOTE — MAU Provider Note (Signed)
History  CSN: [id number] Arrival date and time: 10/06/17 1201  First Provider Initiated Contact with Patient 10/06/17 1248      Chief Complaint  Patient presents with  . Contractions    HPI: Christine Orozco is a 32 y.o. [phone] with IUP at 8513w4d who presents to maternity admissions reporting upper abdominal pain and headache. She reports that pain started around 9:30 am on her upper abdomen, felt like contractions, but also burning sensation. Denies nausea or vomiting. Also denies any vaginal bleeding, or abnormal discharge Reports good fetal movement. Endorses pain started after she ate this morning around 9:30 am, she had 8 chicken minis. Denis fever, chills, diarrhea, or urinary symptoms. Has a mild headache, no visual disturbances; has not taken anything for this. She is on PPI for reflux.  [facility] Pregnancy complicated by RNI, Rh negative, and shortened CL.   OB History  Gravida Para Term Preterm AB Living  3 1 1   1 1   SAB TAB Ectopic Multiple Live Births  1       1    # Outcome Date GA Lbr Len/2nd Weight Sex Delivery Anes PTL Lv  3 Current           2 SAB 02/25/09 5527w0d         1 Term 10/26/07 2250w5d  5 lb 10 oz (2.551 kg) M Vag-Vacuum EPI  LIV     Past Medical History:  Diagnosis Date  . Hepatitis B antibody positive   . Vaginal Pap smear, abnormal    Past Surgical History:  Procedure Laterality Date  . CERVICAL BIOPSY  W/ LOOP ELECTRODE EXCISION    . extraction of wisdom teeth     Family History  Adopted: Yes   Social History   Socioeconomic History  . Marital status: Single    Spouse name: Not on file  . Number of children: Not on file  . Years of education: Not on file  . Highest education level: Not on file  Social Needs  . Financial resource strain: Not on file  . Food insecurity - worry: Not on file  . Food insecurity - inability: Not on file  . Transportation needs - medical: Not on file  . Transportation needs - non-medical: Not  on file  Occupational History  . Not on file  Tobacco Use  . Smoking status: Former Smoker    Packs/day: 0.25  . Smokeless tobacco: Never Used  Substance and Sexual Activity  . Alcohol use: No    Frequency: Never  . Drug use: No  . Sexual activity: Yes    Partners: Male    Birth control/protection: None  Other Topics Concern  . Not on file  Social History Narrative  . Not on file   Allergies  Allergen Reactions  . Beef-Derived Products Nausea And Vomiting and Other (See Comments)    Actively vomits. Alpha-gal allergy.    Marland Kitchen. Ultram [Tramadol] Other (See Comments)    Hallucinate and heart stop  . Allegra [Fexofenadine] Palpitations and Other (See Comments)    hyperactive  . Augmentin [Amoxicillin-Pot Clavulanate] Other (See Comments)    Childhood, pt states she can take penicillin Has patient had a PCN reaction causing immediate rash, facial/tongue/throat swelling, SOB or lightheadedness with hypotension: No Has patient had a PCN reaction causing severe rash involving mucus membranes or skin necrosis: No Has patient had a PCN reaction that required hospitalization No Has patient had a PCN reaction occurring within the  last 10 years: No If all of the above answers are "NO", then may proceed with Cephalosporin use.   . Benadryl [Diphenhydramine] Hives and Palpitations  . Naldecon Senior [Guaifenesin] Nausea And Vomiting and Other (See Comments)    Hives   . Sudafed [Pseudoephedrine] Palpitations and Other (See Comments)    hyperactivity   . Zyrtec [Cetirizine] Palpitations and Other (See Comments)    Hyperactive     Medications Prior to Admission  Medication Sig Dispense Refill Last Dose  . acetaminophen (TYLENOL) 500 MG tablet Take 1 tablet (500 mg total) by mouth every 6 (six) hours as needed. 30 tablet 0 Taking  . aspirin EC 81 MG tablet Take 1 tablet (81 mg total) by mouth daily. Take after 12 weeks for prevention of preeclampsia later in pregnancy 300 tablet 2 Taking   . benzonatate (TESSALON PERLES) 100 MG capsule Take 1 capsule (100 mg total) by mouth 3 (three) times daily as needed for cough. (Patient not taking: Reported on 09/13/2017) 20 capsule 0 Not Taking  . Hydrocortisone Acetate 1 % CREA Apply to affected area twice daily (Patient not taking: Reported on 08/29/2017) 30 g 1 Not Taking  . mometasone (NASONEX) 50 MCG/ACT nasal spray Place 2 sprays into the nose daily. (Patient not taking: Reported on 08/29/2017) 17 g 0 Not Taking  . pantoprazole (PROTONIX) 20 MG tablet Take 1 tablet (20 mg total) by mouth daily. 30 tablet 2 Taking  . Prenatal MV-Min-FA-Omega-3 (PRENATAL GUMMIES/DHA & FA) 0.4-32.5 MG CHEW Chew by mouth.   Taking  . progesterone (PROMETRIUM) 200 MG capsule Place one capsule vaginally at bedtime 30 capsule 3 Taking  . sodium chloride (OCEAN) 0.65 % SOLN nasal spray Place 1 spray into both nostrils as needed for congestion. (Patient not taking: Reported on 09/28/2017) 1 Bottle 0 Not Taking    I have reviewed patient's Past Medical Hx, Surgical Hx, Family Hx, Social Hx, medications and allergies.   Review of Systems: Negative except for what is mentioned in HPI.  Physical Exam   Blood pressure 125/65, pulse 92, temperature 98.7 F (37.1 C), temperature source Oral, resp. rate 18, height 5\' 3"  (1.6 m), weight 163 lb (73.9 kg), last menstrual period 02/27/2017.  Constitutional: Well-developed, well-nourished female in no acute distress.  HENT: [place]/AT, normal oropharynx mucosa. MMM Eyes: normal conjunctivae, no scleral icterus Cardiovascular: normal rate Respiratory: normal effort GI: Abd soft, gravid appropriate for gestational age. She has mild epigastric TTP w/o rebound or guarding. No uterine tenderness.  Pelvic: NEFG. Normal vaginal mucosa without lesions. Cervix pink, visually closed, without lesion. Cervix closed MSK: Extremities nontender, no edema Neurologic: Alert and oriented x 4. Psych: Normal mood and affect Skin: warm and  dry   FHT:  Baseline 130 , moderate variability, accelerations present, no decelerations Toco: no ctx tracing  MAU Course/MDM:   Nursing notes and VS reviewed. VS wnl. BP normal. Patient seen and examined, as noted above.  Not having contractions, pelvic exam reassuring.  She has epigastric tenderness, and onset of pain after eating. GI cocktail given. Tylenol also given for headache.   Patient's symptoms improved.  Assessment and Plan  Assessment: 1. Epigastric pain   2. [redacted] weeks gestation of pregnancy   3. NST (non-stress test) reactive   4. Pregnancy headache in third trimester     Plan: --Discharge home in stable condition.  --Discussed diet and management of epigastric pain. --PTL precautions and return precautions   Degele, Kandra Nicolas, MD 10/06/2017 3:04 PM

## 2017-10-06 NOTE — MAU Note (Signed)
Pt. Started contracting around 0930 this morning, called the office and was directed to come here (MAU).  EFM applied FHR 120s.  Toco applied - abd. Soft. Pt. Denies vaginal bleeding, denies sudden gush of fluid.  Positive for fetal movement. Denies taking anything for pain at this time.

## 2017-10-06 NOTE — Discharge Instructions (Signed)
Food Choices for Gastroesophageal Reflux Disease, Adult When you have gastroesophageal reflux disease (GERD), the foods you eat and your eating habits are very important. Choosing the right foods can help ease your discomfort. What guidelines do I need to follow?  Choose fruits, vegetables, whole grains, and low-fat dairy products.  Choose low-fat meat, fish, and poultry.  Limit fats such as oils, salad dressings, butter, nuts, and avocado.  Keep a food diary. This helps you identify foods that cause symptoms.  Avoid foods that cause symptoms. These may be different for everyone.  Eat small meals often instead of 3 large meals a day.  Eat your meals slowly, in a place where you are relaxed.  Limit fried foods.  Cook foods using methods other than frying.  Avoid drinking alcohol.  Avoid drinking large amounts of liquids with your meals.  Avoid bending over or lying down until 2-3 hours after eating. What foods are not recommended? These are some foods and drinks that may make your symptoms worse: Vegetables  Tomatoes. Tomato juice. Tomato and spaghetti sauce. Chili peppers. Onion and garlic. Horseradish. Fruits  Oranges, grapefruit, and lemon (fruit and juice). Meats  High-fat meats, fish, and poultry. This includes hot dogs, ribs, ham, sausage, salami, and bacon. Dairy  Whole milk and chocolate milk. Sour cream. Cream. Butter. Ice cream. Cream cheese. Drinks  Coffee and tea. Bubbly (carbonated) drinks or energy drinks. Condiments  Hot sauce. Barbecue sauce. Sweets/Desserts  Chocolate and cocoa. Donuts. Peppermint and spearmint. Fats and Oils  High-fat foods. This includes French fries and potato chips. Other  Vinegar. Strong spices. This includes black pepper, white pepper, red pepper, cayenne, curry powder, cloves, ginger, and chili powder. The items listed above may not be a complete list of foods and drinks to avoid. Contact your dietitian for more information.    This information is not intended to replace advice given to you by your health care provider. Make sure you discuss any questions you have with your health care provider. Document Released: 03/20/2012 Document Revised: 02/25/2016 Document Reviewed: 07/24/2013 Elsevier Interactive Patient Education  2017 Elsevier Inc.  

## 2017-10-12 ENCOUNTER — Ambulatory Visit (INDEPENDENT_AMBULATORY_CARE_PROVIDER_SITE_OTHER): Payer: Medicaid Other | Admitting: Obstetrics & Gynecology

## 2017-10-12 ENCOUNTER — Encounter: Payer: Self-pay | Admitting: Obstetrics & Gynecology

## 2017-10-12 DIAGNOSIS — Z3480 Encounter for supervision of other normal pregnancy, unspecified trimester: Secondary | ICD-10-CM

## 2017-10-12 NOTE — Patient Instructions (Signed)

## 2017-10-12 NOTE — Progress Notes (Signed)
   PRENATAL VISIT NOTE  Subjective:  Christine Orozco is a 32 y.o. G3P1011 at 7687w3d being seen today for ongoing prenatal care.  She is currently monitored for the following issues for this low-risk pregnancy and has Supervision of other normal pregnancy, antepartum; Rh negative status during pregnancy; Rubella non-immune status, antepartum; and Concern about short cervix during pregnancy in second trimester on their problem list.  Patient reports no complaints.  Contractions: Not present. Vag. Bleeding: None.  Movement: Present. Denies leaking of fluid.   The following portions of the patient's history were reviewed and updated as appropriate: allergies, current medications, past family history, past medical history, past social history, past surgical history and problem list. Problem list updated.  Objective:   Vitals:   10/12/17 1322  BP: 123/73  Pulse: 96  Weight: 164 lb (74.4 kg)    Fetal Status: Fetal Heart Rate (bpm): 140   Movement: Present     General:  Alert, oriented and cooperative. Patient is in no acute distress.  Skin: Skin is warm and dry. No rash noted.   Cardiovascular: Normal heart rate noted  Respiratory: Normal respiratory effort, no problems with respiration noted  Abdomen: Soft, gravid, appropriate for gestational age.  Pain/Pressure: Absent     Pelvic: Cervical exam deferred        Extremities: Normal range of motion.  Edema: Trace  Mental Status:  Normal mood and affect. Normal behavior. Normal judgment and thought content.   Assessment and Plan:  Pregnancy: G3P1011 at 7987w3d  1. Supervision of other normal pregnancy, antepartum Doing well, had normal growth US 4 weeks ago, nl cervical length  Preterm labor symptoms and general obstetric precautions including but not limited to vaginal bleeding, contractions, leaking of fluid and fetal movement were reviewed in detail with the patient. Please refer to After Visit Summary for other counseling  recommendations.  Return in about 2 weeks (around 10/26/2017).   Scheryl DarterJames Chapel Silverthorn, MD

## 2017-10-12 NOTE — Progress Notes (Signed)
Pt wants a work note to reduce work hours .Pt went to MAU last week for contractions.

## 2017-10-17 ENCOUNTER — Inpatient Hospital Stay (HOSPITAL_COMMUNITY)
Admission: AD | Admit: 2017-10-17 | Discharge: 2017-10-17 | Disposition: A | Discharge status: Home / Self Care | Payer: Medicaid Other | Source: Ambulatory Visit | Attending: Obstetrics and Gynecology | Admitting: Obstetrics and Gynecology

## 2017-10-17 ENCOUNTER — Encounter (HOSPITAL_COMMUNITY): Payer: Self-pay | Admitting: *Deleted

## 2017-10-17 DIAGNOSIS — R1030 Lower abdominal pain, unspecified: Secondary | ICD-10-CM | POA: Diagnosis present

## 2017-10-17 DIAGNOSIS — Z880 Allergy status to penicillin: Secondary | ICD-10-CM | POA: Insufficient documentation

## 2017-10-17 DIAGNOSIS — O26893 Other specified pregnancy related conditions, third trimester: Secondary | ICD-10-CM | POA: Diagnosis not present

## 2017-10-17 DIAGNOSIS — Z7982 Long term (current) use of aspirin: Secondary | ICD-10-CM | POA: Diagnosis not present

## 2017-10-17 DIAGNOSIS — R102 Pelvic and perineal pain: Secondary | ICD-10-CM

## 2017-10-17 DIAGNOSIS — Z87891 Personal history of nicotine dependence: Secondary | ICD-10-CM | POA: Diagnosis not present

## 2017-10-17 DIAGNOSIS — O26899 Other specified pregnancy related conditions, unspecified trimester: Secondary | ICD-10-CM

## 2017-10-17 DIAGNOSIS — Z3A33 33 weeks gestation of pregnancy: Secondary | ICD-10-CM | POA: Insufficient documentation

## 2017-10-17 LAB — URINALYSIS, ROUTINE W REFLEX MICROSCOPIC
BILIRUBIN URINE: NEGATIVE
GLUCOSE, UA: NEGATIVE mg/dL
Hgb urine dipstick: NEGATIVE
KETONES UR: NEGATIVE mg/dL
Leukocytes, UA: NEGATIVE
Nitrite: NEGATIVE
PH: 7 (ref 5.0–8.0)
PROTEIN: NEGATIVE mg/dL
Specific Gravity, Urine: 1.012 (ref 1.005–1.030)

## 2017-10-17 LAB — CBC WITH DIFFERENTIAL/PLATELET
BASOS PCT: 0 %
Basophils Absolute: 0 10*3/uL (ref 0.0–0.1)
EOS ABS: 0.1 10*3/uL (ref 0.0–0.7)
EOS PCT: 0 %
HCT: 32.3 % — ABNORMAL LOW (ref 36.0–46.0)
Hemoglobin: 11 g/dL — ABNORMAL LOW (ref 12.0–15.0)
Lymphocytes Relative: 15 %
Lymphs Abs: 2.4 10*3/uL (ref 0.7–4.0)
MCH: 30.5 pg (ref 26.0–34.0)
MCHC: 34.1 g/dL (ref 30.0–36.0)
MCV: 89.5 fL (ref 78.0–100.0)
MONO ABS: 0.4 10*3/uL (ref 0.1–1.0)
MONOS PCT: 3 %
Neutro Abs: 12.5 10*3/uL — ABNORMAL HIGH (ref 1.7–7.7)
Neutrophils Relative %: 82 %
PLATELETS: 190 10*3/uL (ref 150–400)
RBC: 3.61 MIL/uL — ABNORMAL LOW (ref 3.87–5.11)
RDW: 12.9 % (ref 11.5–15.5)
WBC: 15.4 10*3/uL — ABNORMAL HIGH (ref 4.0–10.5)

## 2017-10-17 LAB — WET PREP, GENITAL
Clue Cells Wet Prep HPF POC: NONE SEEN
Sperm: NONE SEEN
TRICH WET PREP: NONE SEEN
YEAST WET PREP: NONE SEEN

## 2017-10-17 MED ORDER — ACETAMINOPHEN 500 MG PO TABS
1000.0000 mg | ORAL_TABLET | Freq: Once | ORAL | Status: AC
  Administered 2017-10-17: 1000 mg via ORAL
  Filled 2017-10-17: qty 2

## 2017-10-17 NOTE — Discharge Instructions (Signed)

## 2017-10-17 NOTE — MAU Provider Note (Signed)
History     CSN: [id number]  Arrival date and time: 10/17/17 0944   First Provider Initiated Contact with Patient 10/17/17 1054     Chief Complaint  Patient presents with  . Abdominal Pain  . Diarrhea   HPI Christine Orozco is a 32 y.o. G3P1011 at [redacted]w[redacted]d who presents with lower abdominal pain. She reports being woken up at 0800 with sharp lower abdominal pain that she rates a 7/10 and has not tried anything for the pain. She denies discharge, vaginal bleeding or leaking of fluid. She reports good fetal movement. Denies any contractions. She has a short cervix that was 2.4 cm at her last u/s. Last intercourse last night.   OB History    Gravida Para Term Preterm AB Living   3 1 1   1 1    SAB TAB Ectopic Multiple Live Births   1       1      Past Medical History:  Diagnosis Date  . Hepatitis B antibody positive   . Vaginal Pap smear, abnormal     Past Surgical History:  Procedure Laterality Date  . CERVICAL BIOPSY  W/ LOOP ELECTRODE EXCISION    . extraction of wisdom teeth      Family History  Adopted: Yes    Social History   Tobacco Use  . Smoking status: Former Smoker    Packs/day: 0.25  . Smokeless tobacco: Never Used  Substance Use Topics  . Alcohol use: No    Frequency: Never  . Drug use: No    Allergies:  Allergies  Allergen Reactions  . Beef-Derived Products Nausea And Vomiting and Other (See Comments)    Actively vomits. Alpha-gal allergy.    Marland Kitchen Ultram [Tramadol] Other (See Comments)    Hallucinate and heart stop  . Allegra [Fexofenadine] Palpitations and Other (See Comments)    hyperactive  . Augmentin [Amoxicillin-Pot Clavulanate] Other (See Comments)    Childhood, pt states she can take penicillin Has patient had a PCN reaction causing immediate rash, facial/tongue/throat swelling, SOB or lightheadedness with hypotension: No Has patient had a PCN reaction causing severe rash involving mucus membranes or skin necrosis: No Has patient had a PCN  reaction that required hospitalization No Has patient had a PCN reaction occurring within the last 10 years: No If all of the above answers are "NO", then may proceed with Cephalosporin use.   . Benadryl [Diphenhydramine] Hives and Palpitations  . Naldecon Senior [Guaifenesin] Nausea And Vomiting and Other (See Comments)    Hives   . Sudafed [Pseudoephedrine] Palpitations and Other (See Comments)    hyperactivity   . Zyrtec [Cetirizine] Palpitations and Other (See Comments)    Hyperactive     Medications Prior to Admission  Medication Sig Dispense Refill Last Dose  . acetaminophen (TYLENOL) 500 MG tablet Take 1 tablet (500 mg total) by mouth every 6 (six) hours as needed. 30 tablet 0 Past Week at Unknown time  . aspirin EC 81 MG tablet Take 1 tablet (81 mg total) by mouth daily. Take after 12 weeks for prevention of preeclampsia later in pregnancy 300 tablet 2 10/16/2017 at Unknown time  . Hydrocortisone Acetate 1 % CREA Apply to affected area twice daily 30 g 1 Past Month at Unknown time  . pantoprazole (PROTONIX) 20 MG tablet Take 1 tablet (20 mg total) by mouth daily. 30 tablet 2 10/16/2017 at Unknown time  . Prenatal MV-Min-FA-Omega-3 (PRENATAL GUMMIES/DHA & FA) 0.4-32.5 MG CHEW Chew 2 capsules by  mouth daily.    10/16/2017 at Unknown time  . progesterone (PROMETRIUM) 200 MG capsule Place one capsule vaginally at bedtime 30 capsule 3 Past Month at Unknown time  . benzonatate (TESSALON PERLES) 100 MG capsule Take 1 capsule (100 mg total) by mouth 3 (three) times daily as needed for cough. (Patient not taking: Reported on 09/13/2017) 20 capsule 0 Not Taking  . mometasone (NASONEX) 50 MCG/ACT nasal spray Place 2 sprays into the nose daily. (Patient not taking: Reported on 08/29/2017) 17 g 0 Not Taking  . sodium chloride (OCEAN) 0.65 % SOLN nasal spray Place 1 spray into both nostrils as needed for congestion. (Patient not taking: Reported on 09/28/2017) 1 Bottle 0 Not Taking    Review of  Systems  Constitutional: Negative.  Negative for fatigue and fever.  HENT: Negative.   Respiratory: Negative.  Negative for shortness of breath.   Cardiovascular: Negative.  Negative for chest pain.  Gastrointestinal: Positive for abdominal pain. Negative for constipation, diarrhea, nausea and vomiting.  Genitourinary: Negative.  Negative for dysuria, vaginal bleeding and vaginal discharge.  Neurological: Negative.  Negative for dizziness and headaches.   Physical Exam   Blood pressure (!) 121/56, pulse 93, temperature 98.2 F (36.8 C), temperature source Oral, resp. rate 17, weight 165 lb 1.9 oz (74.9 kg), last menstrual period 02/27/2017, SpO2 100 %.  Physical Exam  Nursing note and vitals reviewed. Constitutional: She is oriented to person, place, and time. She appears well-developed and well-nourished. No distress.  HENT:  Head: Normocephalic.  Eyes: Pupils are equal, round, and reactive to light.  Cardiovascular: Normal rate, regular rhythm and normal heart sounds.  Respiratory: Effort normal and breath sounds normal. No respiratory distress.  GI: Soft. Bowel sounds are normal. She exhibits no distension. There is no tenderness. There is no rebound and no guarding.  Neurological: She is alert and oriented to person, place, and time.  Skin: Skin is warm and dry.  Psychiatric: She has a normal mood and affect. Her behavior is normal. Judgment and thought content normal.   Dilation: 1 Effacement (%): 60 Station: -3 Presentation: Vertex Exam by:: Cleone Slim, CNM  Fetal Tracing:  Baseline: 120 Variability: moderate Accels: 15x15 Decels: none  Toco: none   MAU Course  Procedures Results for orders placed or performed during the hospital encounter of 10/17/17 (from the past 24 hour(s))  Urinalysis, Routine w reflex microscopic     Status: Abnormal   Collection Time: 10/17/17 10:10 AM  Result Value Ref Range   Color, Urine YELLOW YELLOW   APPearance HAZY (A) CLEAR    Specific Gravity, Urine 1.012 1.005 - 1.030   pH 7.0 5.0 - 8.0   Glucose, UA NEGATIVE NEGATIVE mg/dL   Hgb urine dipstick NEGATIVE NEGATIVE   Bilirubin Urine NEGATIVE NEGATIVE   Ketones, ur NEGATIVE NEGATIVE mg/dL   Protein, ur NEGATIVE NEGATIVE mg/dL   Nitrite NEGATIVE NEGATIVE   Leukocytes, UA NEGATIVE NEGATIVE  Wet prep, genital     Status: Abnormal   Collection Time: 10/17/17 10:59 AM  Result Value Ref Range   Yeast Wet Prep HPF POC NONE SEEN NONE SEEN   Trich, Wet Prep NONE SEEN NONE SEEN   Clue Cells Wet Prep HPF POC NONE SEEN NONE SEEN   WBC, Wet Prep HPF POC FEW (A) NONE SEEN   Sperm NONE SEEN   CBC with Differential/Platelet     Status: Abnormal   Collection Time: 10/17/17 11:26 AM  Result Value Ref Range   WBC 15.4 (  H) 4.0 - 10.5 K/uL   RBC 3.61 (L) 3.87 - 5.11 MIL/uL   Hemoglobin 11.0 (L) 12.0 - 15.0 g/dL   HCT 16.132.3 (L) 09.636.0 - 04.546.0 %   MCV 89.5 78.0 - 100.0 fL   MCH 30.5 26.0 - 34.0 pg   MCHC 34.1 30.0 - 36.0 g/dL   RDW 40.912.9 81.111.5 - 91.415.5 %   Platelets 190 150 - 400 K/uL   Neutrophils Relative % 82 %   Neutro Abs 12.5 (H) 1.7 - 7.7 K/uL   Lymphocytes Relative 15 %   Lymphs Abs 2.4 0.7 - 4.0 K/uL   Monocytes Relative 3 %   Monocytes Absolute 0.4 0.1 - 1.0 K/uL   Eosinophils Relative 0 %   Eosinophils Absolute 0.1 0.0 - 0.7 K/uL   Basophils Relative 0 %   Basophils Absolute 0.0 0.0 - 0.1 K/uL   MDM UA Wet prep and gc/chlamydia Tylenol- reports relief from pain CBC with Diff Low suspicion for appendicitis due to location of pain and absence of fever, leukocytosis and GI complaints.  No cervical change over 2 hours.   Assessment and Plan   1. Pain of round ligament during pregnancy   2. [redacted] weeks gestation of pregnancy    -Discharge home in stable condition -Preterm labor precautions discussed -[facility] -Patient may return to MAU as needed or if her condition were to change or  worsen  Rolm BookbinderCaroline M Naod Sweetland CNM 10/17/2017, 12:42 PM

## 2017-10-17 NOTE — MAU Note (Addendum)
Patient endorses being awaken around 750 am with constant right lower abdominal pain. Rating pain 7/10. Sharp in nature. Has not taken anything for the pain. +diarrhea x2 this am +nausea but no vomiting Denies LOF or VB at this time.  +FM

## 2017-10-18 LAB — GC/CHLAMYDIA PROBE AMP (~~LOC~~) NOT AT ARMC
Chlamydia: NEGATIVE
Neisseria Gonorrhea: NEGATIVE

## 2017-10-20 ENCOUNTER — Inpatient Hospital Stay (HOSPITAL_COMMUNITY)
Admission: AD | Admit: 2017-10-20 | Discharge: 2017-10-20 | Disposition: A | Discharge status: Home / Self Care | Payer: Medicaid Other | Source: Ambulatory Visit | Attending: Obstetrics and Gynecology | Admitting: Obstetrics and Gynecology

## 2017-10-20 ENCOUNTER — Encounter: Payer: Self-pay | Admitting: Obstetrics and Gynecology

## 2017-10-20 ENCOUNTER — Encounter (HOSPITAL_COMMUNITY): Payer: Self-pay

## 2017-10-20 DIAGNOSIS — Z6791 Unspecified blood type, Rh negative: Secondary | ICD-10-CM

## 2017-10-20 DIAGNOSIS — Z3A33 33 weeks gestation of pregnancy: Secondary | ICD-10-CM

## 2017-10-20 DIAGNOSIS — Z880 Allergy status to penicillin: Secondary | ICD-10-CM | POA: Insufficient documentation

## 2017-10-20 DIAGNOSIS — N898 Other specified noninflammatory disorders of vagina: Secondary | ICD-10-CM | POA: Diagnosis not present

## 2017-10-20 DIAGNOSIS — Z3480 Encounter for supervision of other normal pregnancy, unspecified trimester: Secondary | ICD-10-CM

## 2017-10-20 DIAGNOSIS — R109 Unspecified abdominal pain: Secondary | ICD-10-CM | POA: Insufficient documentation

## 2017-10-20 DIAGNOSIS — Z7982 Long term (current) use of aspirin: Secondary | ICD-10-CM | POA: Diagnosis not present

## 2017-10-20 DIAGNOSIS — Z2839 Other underimmunization status: Secondary | ICD-10-CM

## 2017-10-20 DIAGNOSIS — N5089 Other specified disorders of the male genital organs: Secondary | ICD-10-CM | POA: Diagnosis present

## 2017-10-20 DIAGNOSIS — Z87891 Personal history of nicotine dependence: Secondary | ICD-10-CM | POA: Insufficient documentation

## 2017-10-20 DIAGNOSIS — O9989 Other specified diseases and conditions complicating pregnancy, childbirth and the puerperium: Secondary | ICD-10-CM

## 2017-10-20 DIAGNOSIS — O26893 Other specified pregnancy related conditions, third trimester: Secondary | ICD-10-CM | POA: Diagnosis present

## 2017-10-20 DIAGNOSIS — Z283 Underimmunization status: Secondary | ICD-10-CM

## 2017-10-20 LAB — POCT FERN TEST: POCT FERN TEST: NEGATIVE

## 2017-10-20 NOTE — MAU Note (Signed)
Pt has felt small leaks since yesterday. Pt has hx of short cervix, no cerclage. Vaginal progesterone but ran two days ago. 1cm dilated on Tuesday.

## 2017-10-20 NOTE — MAU Provider Note (Signed)
History     CSN: [id number]  Arrival date and time: 10/20/17 1011   First Provider Initiated Contact with Patient 10/20/17 1100      Chief Complaint  Patient presents with  . Abdominal Pain  . Rupture of Membranes   HPI  Ms.  Christine Orozco is a 32 y.o. year old 413P1011 female at 6573w4d weeks gestation who presents to MAU reporting "slight cramping and slight leaking". She reports that she she started leaking yesterday while she was at work. She is now only leaking a small amount with standing and movement. She reports having SI last night. She reports good (+) FM.   Past Medical History:  Diagnosis Date  . Hepatitis B antibody positive   . Vaginal Pap smear, abnormal     Past Surgical History:  Procedure Laterality Date  . CERVICAL BIOPSY  W/ LOOP ELECTRODE EXCISION    . extraction of wisdom teeth      Family History  Adopted: Yes    Social History   Tobacco Use  . Smoking status: Former Smoker    Packs/day: 0.25  . Smokeless tobacco: Never Used  Substance Use Topics  . Alcohol use: No    Frequency: Never  . Drug use: No    Allergies:  Allergies  Allergen Reactions  . Beef-Derived Products Nausea And Vomiting and Other (See Comments)    Actively vomits. Alpha-gal allergy.    Marland Kitchen. Ultram [Tramadol] Other (See Comments)    Hallucinate and heart stop  . Allegra [Fexofenadine] Palpitations and Other (See Comments)    hyperactive  . Augmentin [Amoxicillin-Pot Clavulanate] Other (See Comments)    Childhood, pt states she can take penicillin Has patient had a PCN reaction causing immediate rash, facial/tongue/throat swelling, SOB or lightheadedness with hypotension: No Has patient had a PCN reaction causing severe rash involving mucus membranes or skin necrosis: No Has patient had a PCN reaction that required hospitalization No Has patient had a PCN reaction occurring within the last 10 years: No If all of the above answers are "NO", then may proceed with  Cephalosporin use.   . Benadryl [Diphenhydramine] Hives and Palpitations  . Naldecon Senior [Guaifenesin] Nausea And Vomiting and Other (See Comments)    Hives   . Sudafed [Pseudoephedrine] Palpitations and Other (See Comments)    hyperactivity   . Zyrtec [Cetirizine] Palpitations and Other (See Comments)    Hyperactive     Medications Prior to Admission  Medication Sig Dispense Refill Last Dose  . acetaminophen (TYLENOL) 500 MG tablet Take 1 tablet (500 mg total) by mouth every 6 (six) hours as needed. 30 tablet 0 10/19/2017 at Unknown time  . aspirin EC 81 MG tablet Take 1 tablet (81 mg total) by mouth daily. Take after 12 weeks for prevention of preeclampsia later in pregnancy 300 tablet 2 10/20/2017 at Unknown time  . Hydrocortisone Acetate 1 % CREA Apply to affected area twice daily 30 g 1 Past Week at Unknown time  . pantoprazole (PROTONIX) 20 MG tablet Take 1 tablet (20 mg total) by mouth daily. 30 tablet 2 10/20/2017 at Unknown time  . Prenatal MV-Min-FA-Omega-3 (PRENATAL GUMMIES/DHA & FA) 0.4-32.5 MG CHEW Chew 2 capsules by mouth daily.    10/20/2017 at Unknown time  . progesterone (PROMETRIUM) 200 MG capsule Place one capsule vaginally at bedtime 30 capsule 3 Past Week at Unknown time    Review of Systems  Constitutional: Negative.   HENT: Negative.   Eyes: Negative.   Respiratory: Negative.  Cardiovascular: Negative.   Gastrointestinal: Negative.   Endocrine: Negative.   Genitourinary: Positive for pelvic pain (cramping) and vaginal discharge.  Musculoskeletal: Negative.   Skin: Negative.   Allergic/Immunologic: Negative.   Neurological: Negative.   Hematological: Negative.   Psychiatric/Behavioral: Negative.    Physical Exam   Blood pressure 132/73, pulse 91, temperature 98.1 F (36.7 C), resp. rate 16, weight 167 lb 12 oz (76.1 kg), last menstrual period 02/27/2017, SpO2 99 %.  Physical Exam  Nursing note and vitals reviewed. Constitutional: She is oriented to  person, place, and time. She appears well-developed and well-nourished.  HENT:  Head: Normocephalic.  Eyes: Pupils are equal, round, and reactive to light.  Neck: Normal range of motion.  Cardiovascular: Normal rate, regular rhythm and normal heart sounds.  Respiratory: Effort normal and breath sounds normal.  GI: Soft. Bowel sounds are normal.  Genitourinary:  Genitourinary Comments: Uterus: gravid, S=D, cx: smooth, pink, no lesions, scant amt of thin, white vaginal d/c -- fern slide obtained, closed/long/firm, no CMT or friability, no adnexal tenderness   Musculoskeletal: Normal range of motion.  Neurological: She is alert and oriented to person, place, and time.  Skin: Skin is warm and dry.  Psychiatric: She has a normal mood and affect. Her behavior is normal. Judgment and thought content normal.    MAU Course  Procedures  MDM Speculum exam performed  Fern (1st one negative per RN) NST - FHR: 125 bpm / moderate variability / accels present / decels absent / TOCO: Occ UI noted  Results for orders placed or performed during the hospital encounter of 10/20/17 (from the past 24 hour(s))  Fern Test     Status: None   Collection Time: 10/20/17 11:18 AM  Result Value Ref Range   POCT Fern Test Negative = intact amniotic membranes      Assessment and Plan  Leaking seminal fluid - Reassurance given that the vaginal d/c could possibly be seminal fluid and that is normal  Vaginal discharge during pregnancy in third trimester - Normal appearing vaginal d/c   Raelyn Mora, MSN, CNM 10/20/2017, 11:00 AM

## 2017-10-20 NOTE — MAU Note (Signed)
?   Leaking of clear fluids yesterday when at work.  Small amt continues with standing and movement.  No bleeding, some cramping

## 2017-10-24 ENCOUNTER — Ambulatory Visit (INDEPENDENT_AMBULATORY_CARE_PROVIDER_SITE_OTHER): Payer: Medicaid Other | Admitting: Obstetrics and Gynecology

## 2017-10-24 ENCOUNTER — Encounter: Payer: Self-pay | Admitting: Obstetrics and Gynecology

## 2017-10-24 VITALS — BP 123/85 | HR 83 | Wt 168.5 lb

## 2017-10-24 DIAGNOSIS — O09899 Supervision of other high risk pregnancies, unspecified trimester: Secondary | ICD-10-CM

## 2017-10-24 DIAGNOSIS — O26873 Cervical shortening, third trimester: Secondary | ICD-10-CM

## 2017-10-24 DIAGNOSIS — O26872 Cervical shortening, second trimester: Secondary | ICD-10-CM

## 2017-10-24 DIAGNOSIS — O09893 Supervision of other high risk pregnancies, third trimester: Secondary | ICD-10-CM

## 2017-10-24 DIAGNOSIS — O9989 Other specified diseases and conditions complicating pregnancy, childbirth and the puerperium: Secondary | ICD-10-CM

## 2017-10-24 DIAGNOSIS — Z3480 Encounter for supervision of other normal pregnancy, unspecified trimester: Secondary | ICD-10-CM

## 2017-10-24 DIAGNOSIS — Z6791 Unspecified blood type, Rh negative: Secondary | ICD-10-CM

## 2017-10-24 DIAGNOSIS — O26893 Other specified pregnancy related conditions, third trimester: Secondary | ICD-10-CM

## 2017-10-24 DIAGNOSIS — Z283 Underimmunization status: Secondary | ICD-10-CM

## 2017-10-24 NOTE — Progress Notes (Signed)
Subjective:  Christine Orozco is a 32 y.o. G3P1011 at 101101w1d being seen today for ongoing prenatal care.  She is currently monitored for the following issues for this high-risk pregnancy and has Supervision of other normal pregnancy, antepartum; Rh negative status during pregnancy; Rubella non-immune status, antepartum; and Concern about short cervix during pregnancy in second trimester on their problem list.  Patient reports no complaints.  Contractions: Irritability. Vag. Bleeding: None.  Movement: Present. Denies leaking of fluid.   The following portions of the patient's history were reviewed and updated as appropriate: allergies, current medications, past family history, past medical history, past social history, past surgical history and problem list. Problem list updated.  Objective:   Vitals:   10/24/17 1350  BP: 123/85  Pulse: 83  Weight: 76.4 kg (168 lb 8 oz)    Fetal Status: Fetal Heart Rate (bpm): 158   Movement: Present     General:  Alert, oriented and cooperative. Patient is in no acute distress.  Skin: Skin is warm and dry. No rash noted.   Cardiovascular: Normal heart rate noted  Respiratory: Normal respiratory effort, no problems with respiration noted  Abdomen: Soft, gravid, appropriate for gestational age. Pain/Pressure: Absent     Pelvic:  Cervical exam deferred        Extremities: Normal range of motion.  Edema: Trace  Mental Status: Normal mood and affect. Normal behavior. Normal judgment and thought content.   Urinalysis:      Assessment and Plan:  Pregnancy: G3P1011 at 34101w1d  1. Supervision of other normal pregnancy, antepartum Stable GBS next visit  2. Concern about short cervix during pregnancy in second trimester Stable Continue with Prometrium  3. Rh negative status during pregnancy in third trimester S/P Rhogam  4. Rubella non-immune status, antepartum Vaccine PP  Preterm labor symptoms and general obstetric precautions including but not  limited to vaginal bleeding, contractions, leaking of fluid and fetal movement were reviewed in detail with the patient. Please refer to After Visit Summary for other counseling recommendations.  Return in about 2 weeks (around 11/07/2017) for OB visit.   Hermina StaggersErvin, Tenita Cue L, MD

## 2017-10-24 NOTE — Progress Notes (Signed)
Patient reports good fetal movement with occasional uterine irritability. 

## 2017-10-26 ENCOUNTER — Encounter: Payer: Medicaid Other | Admitting: Obstetrics & Gynecology

## 2017-11-03 ENCOUNTER — Encounter (HOSPITAL_COMMUNITY): Payer: Self-pay

## 2017-11-03 ENCOUNTER — Inpatient Hospital Stay (HOSPITAL_COMMUNITY)
Admission: AD | Admit: 2017-11-03 | Discharge: 2017-11-03 | Disposition: A | Discharge status: Home / Self Care | Payer: Medicaid Other | Source: Ambulatory Visit | Attending: Obstetrics and Gynecology | Admitting: Obstetrics and Gynecology

## 2017-11-03 ENCOUNTER — Other Ambulatory Visit: Payer: Self-pay

## 2017-11-03 DIAGNOSIS — Z3A35 35 weeks gestation of pregnancy: Secondary | ICD-10-CM | POA: Diagnosis not present

## 2017-11-03 DIAGNOSIS — Z880 Allergy status to penicillin: Secondary | ICD-10-CM | POA: Insufficient documentation

## 2017-11-03 DIAGNOSIS — K644 Residual hemorrhoidal skin tags: Secondary | ICD-10-CM | POA: Diagnosis not present

## 2017-11-03 DIAGNOSIS — Z3689 Encounter for other specified antenatal screening: Secondary | ICD-10-CM

## 2017-11-03 DIAGNOSIS — O9989 Other specified diseases and conditions complicating pregnancy, childbirth and the puerperium: Secondary | ICD-10-CM | POA: Insufficient documentation

## 2017-11-03 DIAGNOSIS — O4693 Antepartum hemorrhage, unspecified, third trimester: Secondary | ICD-10-CM | POA: Insufficient documentation

## 2017-11-03 DIAGNOSIS — Z87891 Personal history of nicotine dependence: Secondary | ICD-10-CM | POA: Diagnosis not present

## 2017-11-03 DIAGNOSIS — O2243 Hemorrhoids in pregnancy, third trimester: Secondary | ICD-10-CM | POA: Diagnosis not present

## 2017-11-03 DIAGNOSIS — O09899 Supervision of other high risk pregnancies, unspecified trimester: Secondary | ICD-10-CM

## 2017-11-03 DIAGNOSIS — Z3480 Encounter for supervision of other normal pregnancy, unspecified trimester: Secondary | ICD-10-CM

## 2017-11-03 DIAGNOSIS — Z283 Underimmunization status: Secondary | ICD-10-CM

## 2017-11-03 DIAGNOSIS — O26893 Other specified pregnancy related conditions, third trimester: Secondary | ICD-10-CM

## 2017-11-03 DIAGNOSIS — Z6791 Unspecified blood type, Rh negative: Secondary | ICD-10-CM

## 2017-11-03 DIAGNOSIS — K5900 Constipation, unspecified: Secondary | ICD-10-CM | POA: Insufficient documentation

## 2017-11-03 LAB — URINALYSIS, ROUTINE W REFLEX MICROSCOPIC
Bilirubin Urine: NEGATIVE
GLUCOSE, UA: NEGATIVE mg/dL
Hgb urine dipstick: NEGATIVE
KETONES UR: NEGATIVE mg/dL
NITRITE: NEGATIVE
PROTEIN: NEGATIVE mg/dL
Specific Gravity, Urine: 1.023 (ref 1.005–1.030)
pH: 5 (ref 5.0–8.0)

## 2017-11-03 MED ORDER — HYDROCORTISONE 2.5 % RE CREA: 1.0000 "application " | TOPICAL_CREAM | Freq: Two times a day (BID) | RECTAL | 0 refills | Status: DC

## 2017-11-03 MED ORDER — POLYETHYLENE GLYCOL 3350 17 GM/SCOOP PO POWD: 17.0000 g | Freq: Every day | ORAL | 0 refills | Status: DC

## 2017-11-03 MED ORDER — WITCH HAZEL-GLYCERIN EX PADS: 1.0000 "application " | MEDICATED_PAD | CUTANEOUS | 0 refills | Status: DC | PRN

## 2017-11-03 NOTE — Discharge Instructions (Signed)
Constipation, Adult Constipation is when a person:  Poops (has a bowel movement) fewer times in a week than normal.  Has a hard time pooping.  Has poop that is dry, hard, or bigger than normal.  Follow these instructions at home: Eating and drinking   Eat foods that have a lot of fiber, such as: ? Fresh fruits and vegetables. ? Whole grains. ? Beans.  Eat less of foods that are high in fat, low in fiber, or overly processed, such as: ? French fries. ? Hamburgers. ? Cookies. ? Candy. ? Soda.  Drink enough fluid to keep your pee (urine) clear or pale yellow. General instructions  Exercise regularly or as told by your doctor.  Go to the restroom when you feel like you need to poop. Do not hold it in.  Take over-the-counter and prescription medicines only as told by your doctor. These include any fiber supplements.  Do pelvic floor retraining exercises, such as: ? Doing deep breathing while relaxing your lower belly (abdomen). ? Relaxing your pelvic floor while pooping.  Watch your condition for any changes.  Keep all follow-up visits as told by your doctor. This is important. Contact a doctor if:  You have pain that gets worse.  You have a fever.  You have not pooped for 4 days.  You throw up (vomit).  You are not hungry.  You lose weight.  You are bleeding from the anus.  You have thin, pencil-like poop (stool). Get help right away if:  You have a fever, and your symptoms suddenly get worse.  You leak poop or have blood in your poop.  Your belly feels hard or bigger than normal (is bloated).  You have very bad belly pain.  You feel dizzy or you faint. This information is not intended to replace advice given to you by your health care provider. Make sure you discuss any questions you have with your health care provider. Document Released: 03/07/2008 Document Revised: 04/08/2016 Document Reviewed: 03/09/2016 Elsevier Interactive Patient Education   2018 [employer]    Hemorrhoids Hemorrhoids are swollen veins in and around the rectum or anus. Hemorrhoids can cause pain, itching, or bleeding. Most of the time, they do not cause serious problems. They usually get better with diet changes, lifestyle changes, and other home treatments. Follow these instructions at home: Eating and drinking Eat foods that have fiber, such as whole grains, beans, nuts, fruits, and vegetables. Ask your doctor about taking products that have added fiber (fibersupplements). Drink enough fluid to keep your pee (urine) clear or pale yellow. For Pain and Swelling Take a warm-water bath (sitz bath) for 20 minutes to ease pain. Do this 3-4 times a day. If directed, put ice on the painful area. It may be helpful to use ice between your warm baths. Put ice in a plastic bag. Place a towel between your skin and the bag. Leave the ice on for 20 minutes, 2-3 times a day. General instructions Take over-the-counter and prescription medicines only as told by your doctor. Medicated creams and medicines that are inserted into the anus (suppositories) may be used or applied as told. Exercise often. Go to the bathroom when you have the urge to poop (to have a bowel movement). Do not wait. Avoid pushing too hard (straining) when you poop. Keep the butt area dry and clean. Use wet toilet paper or moist paper towels. Do not sit on the toilet for a long time. Contact a doctor if: You have any   of these: Pain and swelling that do not get better with treatment or medicine. Bleeding that will not stop. Trouble pooping or you cannot poop. Pain or swelling outside the area of the hemorrhoids. This information is not intended to replace advice given to you by your health care provider. Make sure you discuss any questions you have with your health care provider. Document Released: 06/28/2008 Document Revised: 02/25/2016 Document Reviewed: 06/03/2015 Elsevier Interactive Patient  Education  2018 [employer]  

## 2017-11-03 NOTE — MAU Provider Note (Signed)
History     CSN: [id number]  Arrival date and time: 11/03/17 1304   First Provider Initiated Contact with Patient 11/03/17 1332      Chief Complaint  Patient presents with  . Vaginal Bleeding  . Rectal Bleeding   G4P1011 @35 .4 wks here with bleeding. She saw drk red blood on the toilet paper this am. She is unsure if the blood was vaginal or rectal. Hx of hemorrhoids. No BM in last 4 days. Has tried stool softener this week. Reports good FM. No LOF or ctx.    OB History    Gravida Para Term Preterm AB Living   4 1 1   1 1    SAB TAB Ectopic Multiple Live Births   1       1      Past Medical History:  Diagnosis Date  . Hepatitis B antibody positive   . Vaginal Pap smear, abnormal     Past Surgical History:  Procedure Laterality Date  . CERVICAL BIOPSY  W/ LOOP ELECTRODE EXCISION    . extraction of wisdom teeth      Family History  Adopted: Yes    Social History   Tobacco Use  . Smoking status: Former Smoker    Packs/day: 0.25  . Smokeless tobacco: Never Used  Substance Use Topics  . Alcohol use: No    Frequency: Never  . Drug use: No    Allergies:  Allergies  Allergen Reactions  . Beef-Derived Products Nausea And Vomiting and Other (See Comments)    Actively vomits. Alpha-gal allergy.    Marland Kitchen Ultram [Tramadol] Other (See Comments)    Hallucinate and heart stop  . Allegra [Fexofenadine] Palpitations and Other (See Comments)    hyperactive  . Augmentin [Amoxicillin-Pot Clavulanate] Other (See Comments)    Childhood, pt states she can take penicillin Has patient had a PCN reaction causing immediate rash, facial/tongue/throat swelling, SOB or lightheadedness with hypotension: No Has patient had a PCN reaction causing severe rash involving mucus membranes or skin necrosis: No Has patient had a PCN reaction that required hospitalization No Has patient had a PCN reaction occurring within the last 10 years: No If all of the above answers are "NO", then may  proceed with Cephalosporin use.   . Benadryl [Diphenhydramine] Hives and Palpitations  . Naldecon Senior [Guaifenesin] Nausea And Vomiting and Other (See Comments)    Hives   . Sudafed [Pseudoephedrine] Palpitations and Other (See Comments)    hyperactivity   . Zyrtec [Cetirizine] Palpitations and Other (See Comments)    Hyperactive     Medications Prior to Admission  Medication Sig Dispense Refill Last Dose  . acetaminophen (TYLENOL) 500 MG tablet Take 1 tablet (500 mg total) by mouth every 6 (six) hours as needed. 30 tablet 0 Taking  . aspirin EC 81 MG tablet Take 1 tablet (81 mg total) by mouth daily. Take after 12 weeks for prevention of preeclampsia later in pregnancy 300 tablet 2 Taking  . Hydrocortisone Acetate 1 % CREA Apply to affected area twice daily (Patient not taking: Reported on 10/24/2017) 30 g 1 Not Taking  . pantoprazole (PROTONIX) 20 MG tablet Take 1 tablet (20 mg total) by mouth daily. 30 tablet 2 Taking  . Prenatal MV-Min-FA-Omega-3 (PRENATAL GUMMIES/DHA & FA) 0.4-32.5 MG CHEW Chew 2 capsules by mouth daily.    Taking  . progesterone (PROMETRIUM) 200 MG capsule Place one capsule vaginally at bedtime 30 capsule 3 Taking    Review of Systems  Gastrointestinal:  Positive for constipation and rectal pain. Negative for abdominal pain.   Physical Exam   Blood pressure 109/71, pulse 89, temperature 98.4 F (36.9 C), temperature source Oral, resp. rate 18, weight 172 lb 12 oz (78.4 kg), last menstrual period 02/27/2017, SpO2 99 %, unknown if currently breastfeeding.  Physical Exam  Nursing note and vitals reviewed. Constitutional: She is oriented to person, place, and time. She appears well-developed and well-nourished. No distress.  HENT:  Head: Normocephalic and atraumatic.  Neck: Normal range of motion.  Cardiovascular: Normal rate.  Respiratory: Effort normal. No respiratory distress.  GI: Soft. She exhibits no distension. There is no tenderness.  gravid   Genitourinary:  Genitourinary Comments: External: no lesions or erythema Vagina: rugated, pink, moist, scant creamy discharge, no blood Cervix 1/70/-2, vtx Large cluster of external hemorrhoids present, no thrombosis  Musculoskeletal: Normal range of motion.  Neurological: She is alert and oriented to person, place, and time.  Skin: Skin is warm and dry.  Psychiatric: She has a normal mood and affect.  EFM: 135 bpm, mod variability, + accels, no decels Toco: none  No results found for this or any previous visit (from the past 24 hour(s)).  MAU Course  Procedures  MDM FHR reassuring. No evidence of PTL. Bleeding likely from hemorrhoids. Stable for discharge home.  Assessment and Plan  [redacted] weeks gestation Reactive NST Constipation Hemorrhoids  Discharge home Rx Miralax Rx Proctocream/witchhazel Increase water intake Increase fruits and veggies  Allergies as of 11/03/2017      Reactions   Beef-derived Products Nausea And Vomiting, Other (See Comments)   Actively vomits. Alpha-gal allergy.     Ultram [tramadol] Other (See Comments)   Hallucinate and heart stop   Allegra [fexofenadine] Palpitations, Other (See Comments)   hyperactive   Augmentin [amoxicillin-pot Clavulanate] Other (See Comments)   Childhood, pt states she can take penicillin Has patient had a PCN reaction causing immediate rash, facial/tongue/throat swelling, SOB or lightheadedness with hypotension: No Has patient had a PCN reaction causing severe rash involving mucus membranes or skin necrosis: No Has patient had a PCN reaction that required hospitalization No Has patient had a PCN reaction occurring within the last 10 years: No If all of the above answers are "NO", then may proceed with Cephalosporin use.   Benadryl [diphenhydramine] Hives, Palpitations   Naldecon Senior [guaifenesin] Nausea And Vomiting, Other (See Comments)   Hives    Sudafed [pseudoephedrine] Palpitations, Other (See Comments)    hyperactivity    Zyrtec [cetirizine] Palpitations, Other (See Comments)   Hyperactive       Medication List    TAKE these medications   acetaminophen 500 MG tablet Commonly known as:  TYLENOL Take 1 tablet (500 mg total) by mouth every 6 (six) hours as needed.   aspirin EC 81 MG tablet Take 1 tablet (81 mg total) by mouth daily. Take after 12 weeks for prevention of preeclampsia later in pregnancy   hydrocortisone 2.5 % rectal cream Commonly known as:  ANUSOL-HC Place 1 application rectally 2 (two) times daily.   Hydrocortisone Acetate 1 % Crea Apply to affected area twice daily   pantoprazole 20 MG tablet Commonly known as:  PROTONIX Take 1 tablet (20 mg total) by mouth daily.   polyethylene glycol powder powder Commonly known as:  GLYCOLAX/MIRALAX Take 17 g by mouth daily.   PRENATAL GUMMIES/DHA & FA 0.4-32.5 MG Chew Chew 2 capsules by mouth daily.   progesterone 200 MG capsule Commonly known as:  PROMETRIUM Place one  capsule vaginally at bedtime   witch hazel-glycerin pad Commonly known as:  TUCKS Apply 1 application topically as needed for itching.      Donette Larry, CNM 11/03/2017, 1:40 PM

## 2017-11-03 NOTE — MAU Note (Signed)
Noted a lot of blood when went to 'poop".  Not sure where it was coming from.  Has hemorrhoids.  No placental issues, does have short cervix

## 2017-11-07 ENCOUNTER — Ambulatory Visit (INDEPENDENT_AMBULATORY_CARE_PROVIDER_SITE_OTHER): Payer: Medicaid Other | Admitting: Obstetrics and Gynecology

## 2017-11-07 ENCOUNTER — Encounter: Payer: Self-pay | Admitting: *Deleted

## 2017-11-07 ENCOUNTER — Encounter: Payer: Self-pay | Admitting: Obstetrics and Gynecology

## 2017-11-07 ENCOUNTER — Other Ambulatory Visit (HOSPITAL_COMMUNITY)
Admission: RE | Admit: 2017-11-07 | Discharge: 2017-11-07 | Disposition: A | Discharge status: Home / Self Care | Payer: Medicaid Other | Source: Ambulatory Visit | Attending: Obstetrics and Gynecology | Admitting: Obstetrics and Gynecology

## 2017-11-07 VITALS — BP 120/84 | HR 99 | Wt 174.4 lb

## 2017-11-07 DIAGNOSIS — O26873 Cervical shortening, third trimester: Secondary | ICD-10-CM

## 2017-11-07 DIAGNOSIS — O26893 Other specified pregnancy related conditions, third trimester: Secondary | ICD-10-CM

## 2017-11-07 DIAGNOSIS — Z283 Underimmunization status: Secondary | ICD-10-CM

## 2017-11-07 DIAGNOSIS — Z2839 Other underimmunization status: Secondary | ICD-10-CM

## 2017-11-07 DIAGNOSIS — Z3483 Encounter for supervision of other normal pregnancy, third trimester: Secondary | ICD-10-CM

## 2017-11-07 DIAGNOSIS — N898 Other specified noninflammatory disorders of vagina: Secondary | ICD-10-CM | POA: Insufficient documentation

## 2017-11-07 DIAGNOSIS — Z6791 Unspecified blood type, Rh negative: Secondary | ICD-10-CM

## 2017-11-07 DIAGNOSIS — O9989 Other specified diseases and conditions complicating pregnancy, childbirth and the puerperium: Secondary | ICD-10-CM

## 2017-11-07 DIAGNOSIS — O09893 Supervision of other high risk pregnancies, third trimester: Secondary | ICD-10-CM

## 2017-11-07 DIAGNOSIS — Z3480 Encounter for supervision of other normal pregnancy, unspecified trimester: Secondary | ICD-10-CM

## 2017-11-07 DIAGNOSIS — O26872 Cervical shortening, second trimester: Secondary | ICD-10-CM

## 2017-11-07 LAB — OB RESULTS CONSOLE GC/CHLAMYDIA: Gonorrhea: NEGATIVE

## 2017-11-07 NOTE — Patient Instructions (Signed)
Third Trimester of Pregnancy The third trimester is from week 28 through week 40 (months 7 through 9). The third trimester is a time when the unborn baby (fetus) is growing rapidly. At the end of the ninth month, the fetus is about 20 inches in length and weighs 6-10 pounds. Body changes during your third trimester Your body will continue to go through many changes during pregnancy. The changes vary from woman to woman. During the third trimester:  Your weight will continue to increase. You can expect to gain 25-35 pounds (11-16 kg) by the end of the pregnancy.  You may begin to get stretch marks on your hips, abdomen, and breasts.  You may urinate more often because the fetus is moving lower into your pelvis and pressing on your bladder.  You may develop or continue to have heartburn. This is caused by increased hormones that slow down muscles in the digestive tract.  You may develop or continue to have constipation because increased hormones slow digestion and cause the muscles that push waste through your intestines to relax.  You may develop hemorrhoids. These are swollen veins (varicose veins) in the rectum that can itch or be painful.  You may develop swollen, bulging veins (varicose veins) in your legs.  You may have increased body aches in the pelvis, back, or thighs. This is due to weight gain and increased hormones that are relaxing your joints.  You may have changes in your hair. These can include thickening of your hair, rapid growth, and changes in texture. Some women also have hair loss during or after pregnancy, or hair that feels dry or thin. Your hair will most likely return to normal after your baby is born.  Your breasts will continue to grow and they will continue to become tender. A yellow fluid (colostrum) may leak from your breasts. This is the first milk you are producing for your baby.  Your belly button may stick out.  You may notice more swelling in your hands,  face, or ankles.  You may have increased tingling or numbness in your hands, arms, and legs. The skin on your belly may also feel numb.  You may feel short of breath because of your expanding uterus.  You may have more problems sleeping. This can be caused by the size of your belly, increased need to urinate, and an increase in your body's metabolism.  You may notice the fetus "dropping," or moving lower in your abdomen (lightening).  You may have increased vaginal discharge.  You may notice your joints feel loose and you may have pain around your pelvic bone.  What to expect at prenatal visits You will have prenatal exams every 2 weeks until week 36. Then you will have weekly prenatal exams. During a routine prenatal visit:  You will be weighed to make sure you and the baby are growing normally.  Your blood pressure will be taken.  Your abdomen will be measured to track your baby's growth.  The fetal heartbeat will be listened to.  Any test results from the previous visit will be discussed.  You may have a cervical check near your due date to see if your cervix has softened or thinned (effaced).  You will be tested for Group B streptococcus. This happens between 35 and 37 weeks.  Your health care provider may ask you:  What your birth plan is.  How you are feeling.  If you are feeling the baby move.  If you have had   any abnormal symptoms, such as leaking fluid, bleeding, severe headaches, or abdominal cramping.  If you are using any tobacco products, including cigarettes, chewing tobacco, and electronic cigarettes.  If you have any questions.  Other tests or screenings that may be performed during your third trimester include:  Blood tests that check for low iron levels (anemia).  Fetal testing to check the health, activity level, and growth of the fetus. Testing is done if you have certain medical conditions or if there are problems during the  pregnancy.  Nonstress test (NST). This test checks the health of your baby to make sure there are no signs of problems, such as the baby not getting enough oxygen. During this test, a belt is placed around your belly. The baby is made to move, and its heart rate is monitored during movement.  What is false labor? False labor is a condition in which you feel small, irregular tightenings of the muscles in the womb (contractions) that usually go away with rest, changing position, or drinking water. These are called Braxton Hicks contractions. Contractions may last for hours, days, or even weeks before true labor sets in. If contractions come at regular intervals, become more frequent, increase in intensity, or become painful, you should see your health care provider. What are the signs of labor?  Abdominal cramps.  Regular contractions that start at 10 minutes apart and become stronger and more frequent with time.  Contractions that start on the top of the uterus and spread down to the lower abdomen and back.  Increased pelvic pressure and dull back pain.  A watery or bloody mucus discharge that comes from the vagina.  Leaking of amniotic fluid. This is also known as your "water breaking." It could be a slow trickle or a gush. Let your health care provider know if it has a color or strange odor. If you have any of these signs, call your health care provider right away, even if it is before your due date. Follow these instructions at home: Medicines  Follow your health care provider's instructions regarding medicine use. Specific medicines may be either safe or unsafe to take during pregnancy.  Take a prenatal vitamin that contains at least 600 micrograms (mcg) of folic acid.  If you develop constipation, try taking a stool softener if your health care provider approves. Eating and drinking  Eat a balanced diet that includes fresh fruits and vegetables, whole grains, good sources of protein  such as meat, eggs, or tofu, and low-fat dairy. Your health care provider will help you determine the amount of weight gain that is right for you.  Avoid raw meat and uncooked cheese. These carry germs that can cause birth defects in the baby.  If you have low calcium intake from food, talk to your health care provider about whether you should take a daily calcium supplement.  Eat four or five small meals rather than three large meals a day.  Limit foods that are high in fat and processed sugars, such as fried and sweet foods.  To prevent constipation: ? Drink enough fluid to keep your urine clear or pale yellow. ? Eat foods that are high in fiber, such as fresh fruits and vegetables, whole grains, and beans. Activity  Exercise only as directed by your health care provider. Most women can continue their usual exercise routine during pregnancy. Try to exercise for 30 minutes at least 5 days a week. Stop exercising if you experience uterine contractions.  Avoid heavy   lifting.  Do not exercise in extreme heat or humidity, or at high altitudes.  Wear low-heel, comfortable shoes.  Practice good posture.  You may continue to have sex unless your health care provider tells you otherwise. Relieving pain and discomfort  Take frequent breaks and rest with your legs elevated if you have leg cramps or low back pain.  Take warm sitz baths to soothe any pain or discomfort caused by hemorrhoids. Use hemorrhoid cream if your health care provider approves.  Wear a good support bra to prevent discomfort from breast tenderness.  If you develop varicose veins: ? Wear support pantyhose or compression stockings as told by your healthcare provider. ? Elevate your feet for 15 minutes, 3-4 times a day. Prenatal care  Write down your questions. Take them to your prenatal visits.  Keep all your prenatal visits as told by your health care provider. This is important. Safety  Wear your seat belt at  all times when driving.  Make a list of emergency phone numbers, including numbers for family, friends, the hospital, and police and fire departments. General instructions  Avoid cat litter boxes and soil used by cats. These carry germs that can cause birth defects in the baby. If you have a cat, ask someone to clean the litter box for you.  Do not travel far distances unless it is absolutely necessary and only with the approval of your health care provider.  Do not use hot tubs, steam rooms, or saunas.  Do not drink alcohol.  Do not use any products that contain nicotine or tobacco, such as cigarettes and e-cigarettes. If you need help quitting, ask your health care provider.  Do not use any medicinal herbs or unprescribed drugs. These chemicals affect the formation and growth of the baby.  Do not douche or use tampons or scented sanitary pads.  Do not cross your legs for long periods of time.  To prepare for the arrival of your baby: ? Take prenatal classes to understand, practice, and ask questions about labor and delivery. ? Make a trial run to the hospital. ? Visit the hospital and tour the maternity area. ? Arrange for maternity or paternity leave through employers. ? Arrange for family and friends to take care of pets while you are in the hospital. ? Purchase a rear-facing car seat and make sure you know how to install it in your car. ? Pack your hospital bag. ? Prepare the baby's nursery. Make sure to remove all pillows and stuffed animals from the baby's crib to prevent suffocation.  Visit your dentist if you have not gone during your pregnancy. Use a soft toothbrush to brush your teeth and be gentle when you floss. Contact a health care provider if:  You are unsure if you are in labor or if your water has broken.  You become dizzy.  You have mild pelvic cramps, pelvic pressure, or nagging pain in your abdominal area.  You have lower back pain.  You have persistent  nausea, vomiting, or diarrhea.  You have an unusual or bad smelling vaginal discharge.  You have pain when you urinate. Get help right away if:  Your water breaks before 37 weeks.  You have regular contractions less than 5 minutes apart before 37 weeks.  You have a fever.  You are leaking fluid from your vagina.  You have spotting or bleeding from your vagina.  You have severe abdominal pain or cramping.  You have rapid weight loss or weight gain.    You have shortness of breath with chest pain.  You notice sudden or extreme swelling of your face, hands, ankles, feet, or legs.  Your baby makes fewer than 10 movements in 2 hours.  You have severe headaches that do not go away when you take medicine.  You have vision changes. Summary  The third trimester is from week 28 through week 40, months 7 through 9. The third trimester is a time when the unborn baby (fetus) is growing rapidly.  During the third trimester, your discomfort may increase as you and your baby continue to gain weight. You may have abdominal, leg, and back pain, sleeping problems, and an increased need to urinate.  During the third trimester your breasts will keep growing and they will continue to become tender. A yellow fluid (colostrum) may leak from your breasts. This is the first milk you are producing for your baby.  False labor is a condition in which you feel small, irregular tightenings of the muscles in the womb (contractions) that eventually go away. These are called Braxton Hicks contractions. Contractions may last for hours, days, or even weeks before true labor sets in.  Signs of labor can include: abdominal cramps; regular contractions that start at 10 minutes apart and become stronger and more frequent with time; watery or bloody mucus discharge that comes from the vagina; increased pelvic pressure and dull back pain; and leaking of amniotic fluid. This information is not intended to replace advice  given to you by your health care provider. Make sure you discuss any questions you have with your health care provider. Document Released: 09/13/2001 Document Revised: 02/25/2016 Document Reviewed: 11/20/2012 Elsevier Interactive Patient Education  2017 [employer]  

## 2017-11-07 NOTE — Progress Notes (Signed)
Subjective:  Christine Orozco is a 32 y.o. G4P1011 at 1723w1d being seen today for ongoing prenatal care.  She is currently monitored for the following issues for this high-risk pregnancy and has Supervision of other normal pregnancy, antepartum; Rh negative status during pregnancy; Rubella non-immune status, antepartum; and Concern about short cervix during pregnancy in second trimester on their problem list.  Patient reports tooth ache.  Contractions: Irregular. Vag. Bleeding: None.  Movement: Present. Denies leaking of fluid.   The following portions of the patient's history were reviewed and updated as appropriate: allergies, current medications, past family history, past medical history, past social history, past surgical history and problem list. Problem list updated.  Objective:   Vitals:   11/07/17 0956  BP: 120/84  Pulse: 99  Weight: 174 lb 6.4 oz (79.1 kg)    Fetal Status: Fetal Heart Rate (bpm): 135   Movement: Present     General:  Alert, oriented and cooperative. Patient is in no acute distress.  Skin: Skin is warm and dry. No rash noted.   Cardiovascular: Normal heart rate noted  Respiratory: Normal respiratory effort, no problems with respiration noted  Abdomen: Soft, gravid, appropriate for gestational age. Pain/Pressure: Present     Pelvic:  Cervical exam performed        Extremities: Normal range of motion.  Edema: Mild pitting, slight indentation  Mental Status: Normal mood and affect. Normal behavior. Normal judgment and thought content.   Urinalysis:      Assessment and Plan:  Pregnancy: G4P1011 at 6823w1d  1. Vaginal discharge  - Cervicovaginal ancillary only  2. Supervision of other normal pregnancy, antepartum Stable - Strep Gp B [facility] Dental letter provided  3. Concern about short cervix during pregnancy in second trimester Continue with Prometrium to 37 weeks  4. Rh negative status during pregnancy in third trimester S/P Rhogam  5. Rubella non-immune  status, antepartum Vaccine PP  Term labor symptoms and general obstetric precautions including but not limited to vaginal bleeding, contractions, leaking of fluid and fetal movement were reviewed in detail with the patient. Please refer to After Visit Summary for other counseling recommendations.  Return in about 1 week (around 11/14/2017) for OB visit.   Hermina StaggersErvin, Glenn Christo L, MD

## 2017-11-08 LAB — CERVICOVAGINAL ANCILLARY ONLY
Bacterial vaginitis: NEGATIVE
CANDIDA VAGINITIS: NEGATIVE
Chlamydia: NEGATIVE
Neisseria Gonorrhea: NEGATIVE
Trichomonas: NEGATIVE

## 2017-11-09 LAB — STREP GP B NAA: STREP GROUP B AG: NEGATIVE

## 2017-11-14 ENCOUNTER — Encounter: Payer: Self-pay | Admitting: Obstetrics and Gynecology

## 2017-11-14 ENCOUNTER — Ambulatory Visit (INDEPENDENT_AMBULATORY_CARE_PROVIDER_SITE_OTHER): Payer: Medicaid Other | Admitting: Obstetrics and Gynecology

## 2017-11-14 VITALS — BP 111/77 | HR 102 | Wt 175.3 lb

## 2017-11-14 DIAGNOSIS — O26893 Other specified pregnancy related conditions, third trimester: Secondary | ICD-10-CM

## 2017-11-14 DIAGNOSIS — Z3480 Encounter for supervision of other normal pregnancy, unspecified trimester: Secondary | ICD-10-CM

## 2017-11-14 DIAGNOSIS — Z2839 Other underimmunization status: Secondary | ICD-10-CM

## 2017-11-14 DIAGNOSIS — Z283 Underimmunization status: Secondary | ICD-10-CM

## 2017-11-14 DIAGNOSIS — O09893 Supervision of other high risk pregnancies, third trimester: Secondary | ICD-10-CM

## 2017-11-14 DIAGNOSIS — O9989 Other specified diseases and conditions complicating pregnancy, childbirth and the puerperium: Secondary | ICD-10-CM

## 2017-11-14 DIAGNOSIS — O26872 Cervical shortening, second trimester: Secondary | ICD-10-CM

## 2017-11-14 DIAGNOSIS — Z6791 Unspecified blood type, Rh negative: Secondary | ICD-10-CM

## 2017-11-14 NOTE — Patient Instructions (Signed)
Vaginal Delivery Vaginal delivery means that you will give birth by pushing your baby out of your birth canal (vagina). A team of health care providers will help you before, during, and after vaginal delivery. Birth experiences are unique for every woman and every pregnancy, and birth experiences vary depending on where you choose to give birth. What should I do to prepare for my baby's birth? Before your baby is born, it is important to talk with your health care provider about:  Your labor and delivery preferences. These may include: ? Medicines that you may be given. ? How you will manage your pain. This might include non-medical pain relief techniques or injectable pain relief such as epidural analgesia. ? How you and your baby will be monitored during labor and delivery. ? Who may be in the labor and delivery room with you. ? Your feelings about surgical delivery of your baby (cesarean delivery, or C-section) if this becomes necessary. ? Your feelings about receiving donated blood through an IV tube (blood transfusion) if this becomes necessary.  Whether you are able: ? To take pictures or videos of the birth. ? To eat during labor and delivery. ? To move around, walk, or change positions during labor and delivery.  What to expect after your baby is born, such as: ? Whether delayed umbilical cord clamping and cutting is offered. ? Who will care for your baby right after birth. ? Medicines or tests that may be recommended for your baby. ? Whether breastfeeding is supported in your hospital or birth center. ? How long you will be in the hospital or birth center.  How any medical conditions you have may affect your baby or your labor and delivery experience.  To prepare for your baby's birth, you should also:  Attend all of your health care visits before delivery (prenatal visits) as recommended by your health care provider. This is important.  Prepare your home for your baby's  arrival. Make sure that you have: ? Diapers. ? Baby clothing. ? Feeding equipment. ? Safe sleeping arrangements for you and your baby.  Install a car seat in your vehicle. Have your car seat checked by a certified car seat installer to make sure that it is installed safely.  Think about who will help you with your new baby at home for at least the first several weeks after delivery.  What can I expect when I arrive at the birth center or hospital? Once you are in labor and have been admitted into the hospital or birth center, your health care provider may:  Review your pregnancy history and any concerns you have.  Insert an IV tube into one of your veins. This is used to give you fluids and medicines.  Check your blood pressure, pulse, temperature, and heart rate (vital signs).  Check whether your bag of water (amniotic sac) has broken (ruptured).  Talk with you about your birth plan and discuss pain control options.  Monitoring Your health care provider may monitor your contractions (uterine monitoring) and your baby's heart rate (fetal monitoring). You may need to be monitored:  Often, but not continuously (intermittently).  All the time or for long periods at a time (continuously). Continuous monitoring may be needed if: ? You are taking certain medicines, such as medicine to relieve pain or make your contractions stronger. ? You have pregnancy or labor complications.  Monitoring may be done by:  Placing a special stethoscope or a handheld monitoring device on your abdomen to   check your baby's heartbeat, and feeling your abdomen for contractions. This method of monitoring does not continuously record your baby's heartbeat or your contractions.  Placing monitors on your abdomen (external monitors) to record your baby's heartbeat and the frequency and length of contractions. You may not have to wear external monitors all the time.  Placing monitors inside of your uterus  (internal monitors) to record your baby's heartbeat and the frequency, length, and strength of your contractions. ? Your health care provider may use internal monitors if he or she needs more information about the strength of your contractions or your baby's heart rate. ? Internal monitors are put in place by passing a thin, flexible wire through your vagina and into your uterus. Depending on the type of monitor, it may remain in your uterus or on your baby's head until birth. ? Your health care provider will discuss the benefits and risks of internal monitoring with you and will ask for your permission before inserting the monitors.  Telemetry. This is a type of continuous monitoring that can be done with external or internal monitors. Instead of having to stay in bed, you are able to move around during telemetry. Ask your health care provider if telemetry is an option for you.  Physical exam Your health care provider may perform a physical exam. This may include:  Checking whether your baby is positioned: ? With the head toward your vagina (head-down). This is most common. ? With the head toward the top of your uterus (head-up or breech). If your baby is in a breech position, your health care provider may try to turn your baby to a head-down position so you can deliver vaginally. If it does not seem that your baby can be born vaginally, your provider may recommend surgery to deliver your baby. In rare cases, you may be able to deliver vaginally if your baby is head-up (breech delivery). ? Lying sideways (transverse). Babies that are lying sideways cannot be delivered vaginally.  Checking your cervix to determine: ? Whether it is thinning out (effacing). ? Whether it is opening up (dilating). ? How low your baby has moved into your birth canal.  What are the three stages of labor and delivery?  Normal labor and delivery is divided into the following three stages: Stage 1  Stage 1 is the  longest stage of labor, and it can last for hours or days. Stage 1 includes: ? Early labor. This is when contractions may be irregular, or regular and mild. Generally, early labor contractions are more than 10 minutes apart. ? Active labor. This is when contractions get longer, more regular, more frequent, and more intense. ? The transition phase. This is when contractions happen very close together, are very intense, and may last longer than during any other part of labor.  Contractions generally feel mild, infrequent, and irregular at first. They get stronger, more frequent (about every 2-3 minutes), and more regular as you progress from early labor through active labor and transition.  Many women progress through stage 1 naturally, but you may need help to continue making progress. If this happens, your health care provider may talk with you about: ? Rupturing your amniotic sac if it has not ruptured yet. ? Giving you medicine to help make your contractions stronger and more frequent.  Stage 1 ends when your cervix is completely dilated to 4 inches (10 cm) and completely effaced. This happens at the end of the transition phase. Stage 2  Once   your cervix is completely effaced and dilated to 4 inches (10 cm), you may start to feel an urge to push. It is common for the body to naturally take a rest before feeling the urge to push, especially if you received an epidural or certain other pain medicines. This rest period may last for up to 1-2 hours, depending on your unique labor experience.  During stage 2, contractions are generally less painful, because pushing helps relieve contraction pain. Instead of contraction pain, you may feel stretching and burning pain, especially when the widest part of your baby's head passes through the vaginal opening (crowning).  Your health care provider will closely monitor your pushing progress and your baby's progress through the vagina during stage 2.  Your  health care provider may massage the area of skin between your vaginal opening and anus (perineum) or apply warm compresses to your perineum. This helps it stretch as the baby's head starts to crown, which can help prevent perineal tearing. ? In some cases, an incision may be made in your perineum (episiotomy) to allow the baby to pass through the vaginal opening. An episiotomy helps to make the opening of the vagina larger to allow more room for the baby to fit through.  It is very important to breathe and focus so your health care provider can control the delivery of your baby's head. Your health care provider may have you decrease the intensity of your pushing, to help prevent perineal tearing.  After delivery of your baby's head, the shoulders and the rest of the body generally deliver very quickly and without difficulty.  Once your baby is delivered, the umbilical cord may be cut right away, or this may be delayed for 1-2 minutes, depending on your baby's health. This may vary among health care providers, hospitals, and birth centers.  If you and your baby are healthy enough, your baby may be placed on your chest or abdomen to help maintain the baby's temperature and to help you bond with each other. Some mothers and babies start breastfeeding at this time. Your health care team will dry your baby and help keep your baby warm during this time.  Your baby may need immediate care if he or she: ? Showed signs of distress during labor. ? Has a medical condition. ? Was born too early (prematurely). ? Had a bowel movement before birth (meconium). ? Shows signs of difficulty transitioning from being inside the uterus to being outside of the uterus. If you are planning to breastfeed, your health care team will help you begin a feeding. Stage 3  The third stage of labor starts immediately after the birth of your baby and ends after you deliver the placenta. The placenta is an organ that develops  during pregnancy to provide oxygen and nutrients to your baby in the womb.  Delivering the placenta may require some pushing, and you may have mild contractions. Breastfeeding can stimulate contractions to help you deliver the placenta.  After the placenta is delivered, your uterus should tighten (contract) and become firm. This helps to stop bleeding in your uterus. To help your uterus contract and to control bleeding, your health care provider may: ? Give you medicine by injection, through an IV tube, by mouth, or through your rectum (rectally). ? Massage your abdomen or perform a vaginal exam to remove any blood clots that are left in your uterus. ? Empty your bladder by placing a thin, flexible tube (catheter) into your bladder. ? Encourage   you to breastfeed your baby. After labor is over, you and your baby will be monitored closely to ensure that you are both healthy until you are ready to go home. Your health care team will teach you how to care for yourself and your baby. This information is not intended to replace advice given to you by your health care provider. Make sure you discuss any questions you have with your health care provider. Document Released: 06/28/2008 Document Revised: 04/08/2016 Document Reviewed: 10/04/2015 Elsevier Interactive Patient Education  2018 [employer]  

## 2017-11-14 NOTE — Progress Notes (Signed)
Pt denies concerns at this time. 

## 2017-11-14 NOTE — Progress Notes (Signed)
Subjective:  Christine Orozco is a 32 y.o. G3P1011 at 5387w1d being seen today for ongoing prenatal care.  She is currently monitored for the following issues for this high-risk pregnancy and has Supervision of other normal pregnancy, antepartum; Rh negative status during pregnancy; Rubella non-immune status, antepartum; and Concern about short cervix during pregnancy in second trimester on their problem list.  Patient reports no complaints.  Contractions: Irregular. Vag. Bleeding: None.  Movement: Present. Denies leaking of fluid.   The following portions of the patient's history were reviewed and updated as appropriate: allergies, current medications, past family history, past medical history, past social history, past surgical history and problem list. Problem list updated.  Objective:   Vitals:   11/14/17 0947  BP: 111/77  Pulse: (!) 102  Weight: 175 lb 4.8 oz (79.5 kg)    Fetal Status: Fetal Heart Rate (bpm): 134-doppler   Movement: Present     General:  Alert, oriented and cooperative. Patient is in no acute distress.  Skin: Skin is warm and dry. No rash noted.   Cardiovascular: Normal heart rate noted  Respiratory: Normal respiratory effort, no problems with respiration noted  Abdomen: Soft, gravid, appropriate for gestational age. Pain/Pressure: Present     Pelvic:  Cervical exam deferred        Extremities: Normal range of motion.  Edema: Trace  Mental Status: Normal mood and affect. Normal behavior. Normal judgment and thought content.   Urinalysis:      Assessment and Plan:  Pregnancy: G3P1011 at 6387w1d  1. Supervision of other normal pregnancy, antepartum Stable Labor precautions  2. Concern about short cervix during pregnancy in second trimester Has completed progesterone  3. Rh negative status during pregnancy in third trimester S/P Rhogam  4. Rubella non-immune status, antepartum Vaccine PP  Term labor symptoms and general obstetric precautions including but  not limited to vaginal bleeding, contractions, leaking of fluid and fetal movement were reviewed in detail with the patient. Please refer to After Visit Summary for other counseling recommendations.  Return in about 1 week (around 11/21/2017) for OB visit.   Christine Orozco, Christine Biever L, MD

## 2017-11-21 ENCOUNTER — Ambulatory Visit (INDEPENDENT_AMBULATORY_CARE_PROVIDER_SITE_OTHER): Payer: Medicaid Other | Admitting: Obstetrics & Gynecology

## 2017-11-21 DIAGNOSIS — Z3480 Encounter for supervision of other normal pregnancy, unspecified trimester: Secondary | ICD-10-CM

## 2017-11-21 NOTE — Progress Notes (Signed)
Pt c/o intermittent ctx this am, pelvic pain and pressure.

## 2017-11-21 NOTE — Progress Notes (Signed)
   PRENATAL VISIT NOTE  Subjective:  Christine Orozco is a 32 y.o. G3P1011 at 5468w1d being seen today for ongoing prenatal care.  She is currently monitored for the following issues for this low-risk pregnancy and has Supervision of other normal pregnancy, antepartum; Rh negative status during pregnancy; Rubella non-immune status, antepartum; and Concern about short cervix during pregnancy in second trimester on their problem list.  Patient reports occasional contractions.  Contractions: Irregular. Vag. Bleeding: None.  Movement: Present. Denies leaking of fluid.   The following portions of the patient's history were reviewed and updated as appropriate: allergies, current medications, past family history, past medical history, past social history, past surgical history and problem list. Problem list updated.  Objective:   Vitals:   11/21/17 0945  BP: 140/85  Pulse: 92  Weight: 180 lb 6.4 oz (81.8 kg)    Fetal Status: Fetal Heart Rate (bpm): 130-doppler Fundal Height: 37 cm Movement: Present  Presentation: Vertex  General:  Alert, oriented and cooperative. Patient is in no acute distress.  Skin: Skin is warm and dry. No rash noted.   Cardiovascular: Normal heart rate noted  Respiratory: Normal respiratory effort, no problems with respiration noted  Abdomen: Soft, gravid, appropriate for gestational age.  Pain/Pressure: Present     Pelvic: Cervical exam performed Dilation: 2.5 Effacement (%): 80 Station: -3  Extremities: Normal range of motion.  Edema: Trace  Mental Status:  Normal mood and affect. Normal behavior. Normal judgment and thought content.   Assessment and Plan:  Pregnancy: G3P1011 at 3568w1d  1. Supervision of other normal pregnancy, antepartum   Term labor symptoms and general obstetric precautions including but not limited to vaginal bleeding, contractions, leaking of fluid and fetal movement were reviewed in detail with the patient. Please refer to After Visit Summary  for other counseling recommendations.  Return in about 1 week (around 11/28/2017).   Scheryl DarterJames Isaiha Asare, MD

## 2017-11-21 NOTE — Patient Instructions (Signed)
Vaginal Delivery Vaginal delivery means that you will give birth by pushing your baby out of your birth canal (vagina). A team of health care providers will help you before, during, and after vaginal delivery. Birth experiences are unique for every woman and every pregnancy, and birth experiences vary depending on where you choose to give birth. What should I do to prepare for my baby's birth? Before your baby is born, it is important to talk with your health care provider about:  Your labor and delivery preferences. These may include: ? Medicines that you may be given. ? How you will manage your pain. This might include non-medical pain relief techniques or injectable pain relief such as epidural analgesia. ? How you and your baby will be monitored during labor and delivery. ? Who may be in the labor and delivery room with you. ? Your feelings about surgical delivery of your baby (cesarean delivery, or C-section) if this becomes necessary. ? Your feelings about receiving donated blood through an IV tube (blood transfusion) if this becomes necessary.  Whether you are able: ? To take pictures or videos of the birth. ? To eat during labor and delivery. ? To move around, walk, or change positions during labor and delivery.  What to expect after your baby is born, such as: ? Whether delayed umbilical cord clamping and cutting is offered. ? Who will care for your baby right after birth. ? Medicines or tests that may be recommended for your baby. ? Whether breastfeeding is supported in your hospital or birth center. ? How long you will be in the hospital or birth center.  How any medical conditions you have may affect your baby or your labor and delivery experience.  To prepare for your baby's birth, you should also:  Attend all of your health care visits before delivery (prenatal visits) as recommended by your health care provider. This is important.  Prepare your home for your baby's  arrival. Make sure that you have: ? Diapers. ? Baby clothing. ? Feeding equipment. ? Safe sleeping arrangements for you and your baby.  Install a car seat in your vehicle. Have your car seat checked by a certified car seat installer to make sure that it is installed safely.  Think about who will help you with your new baby at home for at least the first several weeks after delivery.  What can I expect when I arrive at the birth center or hospital? Once you are in labor and have been admitted into the hospital or birth center, your health care provider may:  Review your pregnancy history and any concerns you have.  Insert an IV tube into one of your veins. This is used to give you fluids and medicines.  Check your blood pressure, pulse, temperature, and heart rate (vital signs).  Check whether your bag of water (amniotic sac) has broken (ruptured).  Talk with you about your birth plan and discuss pain control options.  Monitoring Your health care provider may monitor your contractions (uterine monitoring) and your baby's heart rate (fetal monitoring). You may need to be monitored:  Often, but not continuously (intermittently).  All the time or for long periods at a time (continuously). Continuous monitoring may be needed if: ? You are taking certain medicines, such as medicine to relieve pain or make your contractions stronger. ? You have pregnancy or labor complications.  Monitoring may be done by:  Placing a special stethoscope or a handheld monitoring device on your abdomen to   check your baby's heartbeat, and feeling your abdomen for contractions. This method of monitoring does not continuously record your baby's heartbeat or your contractions.  Placing monitors on your abdomen (external monitors) to record your baby's heartbeat and the frequency and length of contractions. You may not have to wear external monitors all the time.  Placing monitors inside of your uterus  (internal monitors) to record your baby's heartbeat and the frequency, length, and strength of your contractions. ? Your health care provider may use internal monitors if he or she needs more information about the strength of your contractions or your baby's heart rate. ? Internal monitors are put in place by passing a thin, flexible wire through your vagina and into your uterus. Depending on the type of monitor, it may remain in your uterus or on your baby's head until birth. ? Your health care provider will discuss the benefits and risks of internal monitoring with you and will ask for your permission before inserting the monitors.  Telemetry. This is a type of continuous monitoring that can be done with external or internal monitors. Instead of having to stay in bed, you are able to move around during telemetry. Ask your health care provider if telemetry is an option for you.  Physical exam Your health care provider may perform a physical exam. This may include:  Checking whether your baby is positioned: ? With the head toward your vagina (head-down). This is most common. ? With the head toward the top of your uterus (head-up or breech). If your baby is in a breech position, your health care provider may try to turn your baby to a head-down position so you can deliver vaginally. If it does not seem that your baby can be born vaginally, your provider may recommend surgery to deliver your baby. In rare cases, you may be able to deliver vaginally if your baby is head-up (breech delivery). ? Lying sideways (transverse). Babies that are lying sideways cannot be delivered vaginally.  Checking your cervix to determine: ? Whether it is thinning out (effacing). ? Whether it is opening up (dilating). ? How low your baby has moved into your birth canal.  What are the three stages of labor and delivery?  Normal labor and delivery is divided into the following three stages: Stage 1  Stage 1 is the  longest stage of labor, and it can last for hours or days. Stage 1 includes: ? Early labor. This is when contractions may be irregular, or regular and mild. Generally, early labor contractions are more than 10 minutes apart. ? Active labor. This is when contractions get longer, more regular, more frequent, and more intense. ? The transition phase. This is when contractions happen very close together, are very intense, and may last longer than during any other part of labor.  Contractions generally feel mild, infrequent, and irregular at first. They get stronger, more frequent (about every 2-3 minutes), and more regular as you progress from early labor through active labor and transition.  Many women progress through stage 1 naturally, but you may need help to continue making progress. If this happens, your health care provider may talk with you about: ? Rupturing your amniotic sac if it has not ruptured yet. ? Giving you medicine to help make your contractions stronger and more frequent.  Stage 1 ends when your cervix is completely dilated to 4 inches (10 cm) and completely effaced. This happens at the end of the transition phase. Stage 2  Once   your cervix is completely effaced and dilated to 4 inches (10 cm), you may start to feel an urge to push. It is common for the body to naturally take a rest before feeling the urge to push, especially if you received an epidural or certain other pain medicines. This rest period may last for up to 1-2 hours, depending on your unique labor experience.  During stage 2, contractions are generally less painful, because pushing helps relieve contraction pain. Instead of contraction pain, you may feel stretching and burning pain, especially when the widest part of your baby's head passes through the vaginal opening (crowning).  Your health care provider will closely monitor your pushing progress and your baby's progress through the vagina during stage 2.  Your  health care provider may massage the area of skin between your vaginal opening and anus (perineum) or apply warm compresses to your perineum. This helps it stretch as the baby's head starts to crown, which can help prevent perineal tearing. ? In some cases, an incision may be made in your perineum (episiotomy) to allow the baby to pass through the vaginal opening. An episiotomy helps to make the opening of the vagina larger to allow more room for the baby to fit through.  It is very important to breathe and focus so your health care provider can control the delivery of your baby's head. Your health care provider may have you decrease the intensity of your pushing, to help prevent perineal tearing.  After delivery of your baby's head, the shoulders and the rest of the body generally deliver very quickly and without difficulty.  Once your baby is delivered, the umbilical cord may be cut right away, or this may be delayed for 1-2 minutes, depending on your baby's health. This may vary among health care providers, hospitals, and birth centers.  If you and your baby are healthy enough, your baby may be placed on your chest or abdomen to help maintain the baby's temperature and to help you bond with each other. Some mothers and babies start breastfeeding at this time. Your health care team will dry your baby and help keep your baby warm during this time.  Your baby may need immediate care if he or she: ? Showed signs of distress during labor. ? Has a medical condition. ? Was born too early (prematurely). ? Had a bowel movement before birth (meconium). ? Shows signs of difficulty transitioning from being inside the uterus to being outside of the uterus. If you are planning to breastfeed, your health care team will help you begin a feeding. Stage 3  The third stage of labor starts immediately after the birth of your baby and ends after you deliver the placenta. The placenta is an organ that develops  during pregnancy to provide oxygen and nutrients to your baby in the womb.  Delivering the placenta may require some pushing, and you may have mild contractions. Breastfeeding can stimulate contractions to help you deliver the placenta.  After the placenta is delivered, your uterus should tighten (contract) and become firm. This helps to stop bleeding in your uterus. To help your uterus contract and to control bleeding, your health care provider may: ? Give you medicine by injection, through an IV tube, by mouth, or through your rectum (rectally). ? Massage your abdomen or perform a vaginal exam to remove any blood clots that are left in your uterus. ? Empty your bladder by placing a thin, flexible tube (catheter) into your bladder. ? Encourage   you to breastfeed your baby. After labor is over, you and your baby will be monitored closely to ensure that you are both healthy until you are ready to go home. Your health care team will teach you how to care for yourself and your baby. This information is not intended to replace advice given to you by your health care provider. Make sure you discuss any questions you have with your health care provider. Document Released: 06/28/2008 Document Revised: 04/08/2016 Document Reviewed: 10/04/2015 Elsevier Interactive Patient Education  2018 [employer]  

## 2017-11-22 ENCOUNTER — Inpatient Hospital Stay (HOSPITAL_COMMUNITY): Payer: Medicaid Other | Admitting: Anesthesiology

## 2017-11-22 ENCOUNTER — Inpatient Hospital Stay (HOSPITAL_COMMUNITY)
Admission: AD | Admit: 2017-11-22 | Discharge: 2017-11-24 | DRG: 807 | Disposition: A | Discharge status: Home / Self Care | Payer: Medicaid Other | Source: Ambulatory Visit | Attending: Obstetrics and Gynecology | Admitting: Obstetrics and Gynecology

## 2017-11-22 ENCOUNTER — Encounter (HOSPITAL_COMMUNITY): Payer: Self-pay

## 2017-11-22 DIAGNOSIS — O26893 Other specified pregnancy related conditions, third trimester: Secondary | ICD-10-CM | POA: Diagnosis present

## 2017-11-22 DIAGNOSIS — O4292 Full-term premature rupture of membranes, unspecified as to length of time between rupture and onset of labor: Secondary | ICD-10-CM | POA: Diagnosis present

## 2017-11-22 DIAGNOSIS — Z3A38 38 weeks gestation of pregnancy: Secondary | ICD-10-CM

## 2017-11-22 DIAGNOSIS — Z87891 Personal history of nicotine dependence: Secondary | ICD-10-CM

## 2017-11-22 DIAGNOSIS — Z6791 Unspecified blood type, Rh negative: Secondary | ICD-10-CM

## 2017-11-22 LAB — CBC
HEMATOCRIT: 31.4 % — AB (ref 36.0–46.0)
HEMOGLOBIN: 10.5 g/dL — AB (ref 12.0–15.0)
MCH: 28 pg (ref 26.0–34.0)
MCHC: 33.4 g/dL (ref 30.0–36.0)
MCV: 83.7 fL (ref 78.0–100.0)
Platelets: 199 10*3/uL (ref 150–400)
RBC: 3.75 MIL/uL — AB (ref 3.87–5.11)
RDW: 13.2 % (ref 11.5–15.5)
WBC: 17.4 10*3/uL — ABNORMAL HIGH (ref 4.0–10.5)

## 2017-11-22 LAB — RPR: RPR: NONREACTIVE

## 2017-11-22 MED ORDER — RHO D IMMUNE GLOBULIN 1500 UNIT/2ML IJ SOSY
300.0000 ug | PREFILLED_SYRINGE | Freq: Once | INTRAMUSCULAR | Status: AC
  Administered 2017-11-23: 300 ug via INTRAVENOUS
  Filled 2017-11-22: qty 2

## 2017-11-22 MED ORDER — EPHEDRINE 5 MG/ML INJ
10.0000 mg | INTRAVENOUS | Status: DC | PRN
  Filled 2017-11-22: qty 2

## 2017-11-22 MED ORDER — WITCH HAZEL-GLYCERIN EX PADS
1.0000 "application " | MEDICATED_PAD | CUTANEOUS | Status: DC | PRN
  Administered 2017-11-23: 1 via TOPICAL

## 2017-11-22 MED ORDER — LACTATED RINGERS IV SOLN
INTRAVENOUS | Status: DC
  Administered 2017-11-22 (×2): via INTRAVENOUS

## 2017-11-22 MED ORDER — PHENYLEPHRINE 40 MCG/ML (10ML) SYRINGE FOR IV PUSH (FOR BLOOD PRESSURE SUPPORT)
80.0000 ug | PREFILLED_SYRINGE | INTRAVENOUS | Status: DC | PRN
  Filled 2017-11-22: qty 5

## 2017-11-22 MED ORDER — DIPHENHYDRAMINE HCL 50 MG/ML IJ SOLN: 12.5000 mg | INTRAMUSCULAR | Status: DC | PRN

## 2017-11-22 MED ORDER — SIMETHICONE 80 MG PO CHEW: 80.0000 mg | CHEWABLE_TABLET | ORAL | Status: DC | PRN

## 2017-11-22 MED ORDER — DIBUCAINE 1 % RE OINT: 1.0000 "application " | TOPICAL_OINTMENT | RECTAL | Status: DC | PRN

## 2017-11-22 MED ORDER — OXYTOCIN 40 UNITS IN LACTATED RINGERS INFUSION - SIMPLE MED: 2.5000 [IU]/h | INTRAVENOUS | Status: DC

## 2017-11-22 MED ORDER — LACTATED RINGERS IV SOLN: 500.0000 mL | Freq: Once | INTRAVENOUS | Status: DC

## 2017-11-22 MED ORDER — ZOLPIDEM TARTRATE 5 MG PO TABS: 5.0000 mg | ORAL_TABLET | Freq: Every evening | ORAL | Status: DC | PRN

## 2017-11-22 MED ORDER — ONDANSETRON HCL 4 MG/2ML IJ SOLN: 4.0000 mg | INTRAMUSCULAR | Status: DC | PRN

## 2017-11-22 MED ORDER — LIDOCAINE HCL (PF) 1 % IJ SOLN
INTRAMUSCULAR | Status: AC
  Filled 2017-11-22: qty 30

## 2017-11-22 MED ORDER — OXYTOCIN 40 UNITS IN LACTATED RINGERS INFUSION - SIMPLE MED
INTRAVENOUS | Status: AC
  Filled 2017-11-22: qty 1000

## 2017-11-22 MED ORDER — OXYTOCIN 40 UNITS IN LACTATED RINGERS INFUSION - SIMPLE MED
1.0000 m[IU]/min | INTRAVENOUS | Status: DC
  Administered 2017-11-22: 2 m[IU]/min via INTRAVENOUS

## 2017-11-22 MED ORDER — PRENATAL MULTIVITAMIN CH
1.0000 | ORAL_TABLET | Freq: Every day | ORAL | Status: DC
  Administered 2017-11-23 – 2017-11-24 (×2): 1 via ORAL
  Filled 2017-11-22 (×2): qty 1

## 2017-11-22 MED ORDER — LACTATED RINGERS IV SOLN: 500.0000 mL | INTRAVENOUS | Status: DC | PRN

## 2017-11-22 MED ORDER — IBUPROFEN 600 MG PO TABS
600.0000 mg | ORAL_TABLET | Freq: Four times a day (QID) | ORAL | Status: DC
  Administered 2017-11-22 – 2017-11-24 (×7): 600 mg via ORAL
  Filled 2017-11-22 (×7): qty 1

## 2017-11-22 MED ORDER — OXYTOCIN BOLUS FROM INFUSION
500.0000 mL | Freq: Once | INTRAVENOUS | Status: AC
  Administered 2017-11-22: 500 mL via INTRAVENOUS

## 2017-11-22 MED ORDER — ONDANSETRON HCL 4 MG PO TABS: 4.0000 mg | ORAL_TABLET | ORAL | Status: DC | PRN

## 2017-11-22 MED ORDER — FENTANYL 2.5 MCG/ML BUPIVACAINE 1/10 % EPIDURAL INFUSION (WH - ANES)
14.0000 mL/h | INTRAMUSCULAR | Status: DC | PRN
  Administered 2017-11-22 (×2): 14 mL/h via EPIDURAL
  Filled 2017-11-22 (×2): qty 100

## 2017-11-22 MED ORDER — SOD CITRATE-CITRIC ACID 500-334 MG/5ML PO SOLN: 30.0000 mL | ORAL | Status: DC | PRN

## 2017-11-22 MED ORDER — LIDOCAINE HCL (PF) 1 % IJ SOLN
INTRAMUSCULAR | Status: DC | PRN
  Administered 2017-11-22: 4 mL
  Administered 2017-11-22: 6 mL via EPIDURAL

## 2017-11-22 MED ORDER — COCONUT OIL OIL
1.0000 | TOPICAL_OIL | Status: DC | PRN
Start: 2017-11-22 — End: 2017-11-24

## 2017-11-22 MED ORDER — RHO D IMMUNE GLOBULIN 1500 UNIT/2ML IJ SOSY
300.0000 ug | PREFILLED_SYRINGE | Freq: Once | INTRAMUSCULAR | Status: DC
  Filled 2017-11-22: qty 2

## 2017-11-22 MED ORDER — ACETAMINOPHEN 325 MG PO TABS
650.0000 mg | ORAL_TABLET | ORAL | Status: DC | PRN
Start: 2017-11-22 — End: 2017-11-22

## 2017-11-22 MED ORDER — ACETAMINOPHEN 325 MG PO TABS
650.0000 mg | ORAL_TABLET | ORAL | Status: DC | PRN
  Administered 2017-11-23 (×2): 650 mg via ORAL
  Filled 2017-11-22 (×2): qty 2

## 2017-11-22 MED ORDER — LIDOCAINE HCL (PF) 1 % IJ SOLN
30.0000 mL | INTRAMUSCULAR | Status: DC | PRN
  Filled 2017-11-22: qty 30

## 2017-11-22 MED ORDER — TETANUS-DIPHTH-ACELL PERTUSSIS 5-2.5-18.5 LF-MCG/0.5 IM SUSP: 0.5000 mL | Freq: Once | INTRAMUSCULAR | Status: DC

## 2017-11-22 MED ORDER — BENZOCAINE-MENTHOL 20-0.5 % EX AERO: 1.0000 "application " | INHALATION_SPRAY | CUTANEOUS | Status: DC | PRN

## 2017-11-22 MED ORDER — ONDANSETRON HCL 4 MG/2ML IJ SOLN: 4.0000 mg | Freq: Four times a day (QID) | INTRAMUSCULAR | Status: DC | PRN

## 2017-11-22 MED ORDER — SENNOSIDES-DOCUSATE SODIUM 8.6-50 MG PO TABS
2.0000 | ORAL_TABLET | ORAL | Status: DC
  Administered 2017-11-22 – 2017-11-24 (×2): 2 via ORAL
  Filled 2017-11-22 (×2): qty 2

## 2017-11-22 NOTE — Progress Notes (Signed)
LABOR PROGRESS NOTE  Debby Budmanda M Sligh is a 32 y.o. G3P1011 at 8241w2d  admitted for SOL and SROM @ 2330 on 2/20  Subjective: Patient comfortable with epidural, not feel any pressure in bottom   Objective: BP 121/83   Pulse (!) 106   Temp (!) 97.5 F (36.4 C) (Oral)   Resp 18   Ht 5\' 3"  (1.6 m)   Wt 180 lb (81.6 kg)   LMP 02/27/2017   SpO2 100%   BMI 31.89 kg/m  or  Vitals:   11/22/17 0900 11/22/17 0930 11/22/17 0932 11/22/17 1000  BP: 134/84 130/81  121/83  Pulse: (!) 108 (!) 103  (!) 106  Resp:   18   Temp:    (!) 97.5 F (36.4 C)  TempSrc:    Oral  SpO2:      Weight:      Height:        Pitocin augmentation started @ 0932  Dilation: Lip/rim Effacement (%): 100 Station: 0 Presentation: Vertex Exam by:: Herma CarsonLindsay Lima, rn FHT: baseline rate 140, moderate varibility, +acel, no decel Toco: 4-6 minutes/ moderate by palpation   Labs: Lab Results  Component Value Date   WBC 17.4 (H) 11/22/2017   HGB 10.5 (L) 11/22/2017   HCT 31.4 (L) 11/22/2017   MCV 83.7 11/22/2017   PLT 199 11/22/2017    Patient Active Problem List   Diagnosis Date Noted  . Normal labor 11/22/2017  . Concern about short cervix during pregnancy in second trimester 07/26/2017  . Rubella non-immune status, antepartum 06/11/2017  . Rh negative status during pregnancy 06/07/2017  . Supervision of other normal pregnancy, antepartum 05/31/2017    Assessment / Plan: 32 y.o. G3P1011 at 6841w2d here for SROM @ 2330. SOL.   Labor: No progress since 0730, pitocin augmentation started @ 0932  Fetal Wellbeing:  Cat 1 Pain Control:  Epidural Anticipated MOD:  SVD  Sharyon CableRogers, Omauri Boeve C, CNM 11/22/2017, 10:12 AM

## 2017-11-22 NOTE — Anesthesia Pain Management Evaluation Note (Signed)
  CRNA Pain Management Visit Note  Patient: Christine Orozco, 32 y.o., female  "Hello [facility] am [facility]. We have an anesthesia team available at all times to provide care throughout the hospital, including epidural management and anesthesia for C-section. I don't know your plan for the delivery whether it a natural birth, water birth, IV sedation, nitrous supplementation, doula or epidural, but we want to meet your pain goals."   1.Was your pain managed to your expectations on prior hospitalizations?   Yes   2.What is your expectation for pain management during this hospitalization?     Epidural  3.How can we help you reach that goal? Maintain epidural.  Record the patient's initial score and the patient's pain goal.   Pain: 0  Pain Goal: 6 [facility] wants you to be able to say your pain was always managed very well.  Dalary Hollar 11/22/2017

## 2017-11-22 NOTE — Progress Notes (Signed)
CNM notified of pushing x 1 hour with pt unaware of contractions.  UCs remain at q5 min with reassuring fhr tracing.  Station has not changed with pushing.  Orders rec to labor down and titrate pitocin as needed.

## 2017-11-22 NOTE — H&P (Signed)
Christine Orozco is a 32 y.o. female presenting for SOL at 2330. Called EMS 2/2 pain and ctx, SROM with EMS at 0250.  OB History    Gravida Para Term Preterm AB Living   3 1 1   1 1    SAB TAB Ectopic Multiple Live Births   1       1     Past Medical History:  Diagnosis Date  . Hepatitis B antibody positive   . Vaginal Pap smear, abnormal    Past Surgical History:  Procedure Laterality Date  . CERVICAL BIOPSY  W/ LOOP ELECTRODE EXCISION    . extraction of wisdom teeth     Family History: family history is not on file. She was adopted. Social History:  reports that she has quit smoking. She smoked 0.25 packs per day. she has never used smokeless tobacco. She reports that she does not drink alcohol or use drugs.     Maternal Diabetes: No Genetic Screening: Declined Maternal Ultrasounds/Referrals: Normal Fetal Ultrasounds or other Referrals:  None Maternal Substance Abuse:  No Significant Maternal Medications:  None Significant Maternal Lab Results:  None Other Comments:  None  ROS History Dilation: 8.5 Blood pressure (!) 103/57, pulse 91, temperature 98.6 F (37 C), temperature source Oral, height 5\' 3"  (1.6 m), weight 180 lb (81.6 kg), last menstrual period 02/27/2017, unknown if currently breastfeeding. Exam Physical Exam  Prenatal labs: ABO, Rh: O/Negative/-- (08/29 1515) Antibody: Negative (08/29 1515) Rubella: <0.90 (08/29 1515) RPR: Non Reactive (12/12 1020)  HBsAg: Negative (08/29 1515)  HIV: Non Reactive (12/12 1020)  GBS: Negative (02/05 1144)   Assessment/Plan: SOL 2330 today, SROM at 0250 with EMS.  Labor: progressing on her own Pain: wants epidural, willing to try nitrous Baby feeding: breast Contraception: depo Circ: yes outpatient  Christine Orozco 11/22/2017, 3:18 AM  OB FELLOW HISTORY AND PHYSICAL ATTESTATION I have seen and examined this patient; I agree with above documentation in the resident's note.   Frederik PearJulie P Degele, MD OB  Fellow 11/22/2017, 4:04 AM

## 2017-11-22 NOTE — Anesthesia Preprocedure Evaluation (Signed)
Anesthesia Evaluation  Patient identified by MRN, date of birth, ID band Patient awake    Reviewed: Allergy & Precautions, H&P , NPO status , Patient's Chart, lab work & pertinent test results, reviewed documented beta blocker date and time   Airway Mallampati: II  TM Distance: >3 FB Neck ROM: full    Dental no notable dental hx.    Pulmonary neg pulmonary ROS, former smoker,    Pulmonary exam normal breath sounds clear to auscultation       Cardiovascular Exercise Tolerance: Good negative cardio ROS   Rhythm:regular Rate:Normal     Neuro/Psych negative neurological ROS  negative psych ROS   GI/Hepatic negative GI ROS, Neg liver ROS,   Endo/Other  negative endocrine ROS  Renal/GU negative Renal ROS  negative genitourinary   Musculoskeletal   Abdominal   Peds  Hematology negative hematology ROS (+)   Anesthesia Other Findings   Reproductive/Obstetrics negative OB ROS                             Anesthesia Physical Anesthesia Plan  ASA: II  Anesthesia Plan: Epidural   Post-op Pain Management:    Induction:   PONV Risk Score and Plan:   Airway Management Planned:   Additional Equipment:   Intra-op Plan:   Post-operative Plan:   Informed Consent: I have reviewed the patients History and Physical, chart, labs and discussed the procedure including the risks, benefits and alternatives for the proposed anesthesia with the patient or authorized representative who has indicated his/her understanding and acceptance.   Dental Advisory Given  Plan Discussed with: CRNA  Anesthesia Plan Comments: (Labs checked- platelets confirmed with RN in room. Fetal heart tracing, per RN, reported to be stable enough for sitting procedure. Discussed epidural, and patient consents to the procedure:  included risk of possible headache,backache, failed block, allergic reaction, and nerve injury. This  patient was asked if she had any questions or concerns before the procedure started.)        Anesthesia Quick Evaluation

## 2017-11-22 NOTE — Anesthesia Procedure Notes (Signed)
Epidural Patient location during procedure: OB  Staffing Anesthesiologist: Talan Gildner, MD  Preanesthetic Checklist Completed: patient identified, pre-op evaluation, timeout performed, IV checked, risks and benefits discussed and monitors and equipment checked  Epidural Patient position: sitting Prep: DuraPrep Patient monitoring: blood pressure and continuous pulse ox Approach: right paramedian Location: L3-L4 Injection technique: LOR air  Needle:  Needle type: Tuohy  Needle gauge: 17 G Needle insertion depth: 5 cm Catheter type: closed end flexible Catheter size: 19 Gauge Catheter at skin depth: 10 cm Test dose: negative  Assessment Sensory level: T8  Additional Notes   Dosing of Epidural:  1st dose, through catheter .............................................  Xylocaine 40 mg  2nd dose, through catheter, after waiting 3 minutes.........Xylocaine 60 mg    As each dose occurred, patient was free of IV sx; and patient exhibited no evidence of SA injection.  Patient is more comfortable after epidural dosed. Please see RN's note for documentation of vital signs,and FHR which are stable.  Patient reminded not to try to ambulate with numb legs, and that an RN must be present when she attempts to get up.          

## 2017-11-23 LAB — KLEIHAUER-BETKE STAIN
# VIALS RHIG: 1
Fetal Cells %: 0 %
QUANTITATION FETAL HEMOGLOBIN: 0 mL

## 2017-11-23 MED ORDER — MEASLES, MUMPS & RUBELLA VAC ~~LOC~~ INJ
0.5000 mL | INJECTION | Freq: Once | SUBCUTANEOUS | Status: DC
  Filled 2017-11-23: qty 0.5

## 2017-11-23 MED ORDER — MEASLES, MUMPS & RUBELLA VAC ~~LOC~~ INJ
0.5000 mL | INJECTION | Freq: Once | SUBCUTANEOUS | Status: AC
  Administered 2017-11-24: 0.5 mL via SUBCUTANEOUS

## 2017-11-23 NOTE — Anesthesia Postprocedure Evaluation (Signed)
Anesthesia Post Note  Patient: Christine Orozco  Procedure(s) Performed: AN AD HOC LABOR EPIDURAL     Patient location during evaluation: Mother Baby Anesthesia Type: Epidural Level of consciousness: awake Pain management: pain level controlled Vital Signs Assessment: post-procedure vital signs reviewed and stable Respiratory status: spontaneous breathing Cardiovascular status: stable Postop Assessment: epidural receding and patient able to bend at knees Anesthetic complications: no    Last Vitals:  Vitals:   11/23/17 0110 11/23/17 0500  BP: (!) 111/45 121/69  Pulse: 82 76  Resp: 17 18  Temp: 36.7 C 36.6 C  SpO2: 98% 98%    Last Pain:  Vitals:   11/23/17 0635  TempSrc:   PainSc: 3    Pain Goal:                 [name],[name]

## 2017-11-23 NOTE — Progress Notes (Signed)
Post Partum Day #1 Subjective: no complaints, up ad lib and tolerating PO; breastfeeding going well; plans on depo for contraception  Objective: Blood pressure 121/69, pulse 76, temperature 97.8 F (36.6 C), resp. rate 18, height _0  (1.6 m), weight 81.6 kg (180 lb), last menstrual period 02/27/2017, SpO2 98 %, unknown if currently breastfeeding.  Physical Exam:  General: alert, cooperative and no distress Lochia: appropriate Uterine Fundus: firm DVT Evaluation: No evidence of DVT seen on physical exam.  Recent Labs    11/22/17 0330  HGB 10.5*  HCT 31.4*    Assessment/Plan: Plan for discharge tomorrow  MMR ordered (pt has rec'd before and did NOT have allergic reaction) Rhogam pending   LOS: 1 day   SHAW, KIMBERLY CNM 11/23/2017, 7:43 AM

## 2017-11-23 NOTE — Lactation Note (Signed)
This note was copied from a baby's chart. Lactation Consultation Note Baby 12 hrs old. Mom stated baby BF well. Mom's 2nd baby. Stated her 1st baby wouldn't latch. Mom has tubular breast w/flat nipples. Very compressible w/easily expressed colostrum. Newborn behavior, feeding habits, STS, I&O, cluster feeding, supply and demand discussed.  Mom encouraged to feed baby 8-12 times/24 hours and with feeding cues.  encouraged to call for assistance or questions. WH/LC brochure given w/resources, support groups and LC services.  Patient Name: Christine Orozco [id number]'LToday's Date: 11/23/2017 Reason for consult: Initial assessment   Maternal Data Has patient been taught Hand Expression?: Yes Does the patient have breastfeeding experience prior to this delivery?: No  Feeding Feeding Type: Breast Fed Length of feed: 10 min  LATCH Score       Type of Nipple: Everted at rest and after stimulation  Comfort (Breast/Nipple): Soft / non-tender        Interventions Interventions: Breast feeding basics reviewed  Lactation Tools Discussed/Used WIC Program: Yes   Consult Status Consult Status: Follow-up Date: 11/24/17 Follow-up type: In-patient    Charyl DancerCARVER, [place] Hert G 11/23/2017, 6:46 AM

## 2017-11-24 LAB — RH IG WORKUP (INCLUDES ABO/RH)
ABO/RH(D): O NEG
Gestational Age(Wks): 38.2
UNIT DIVISION: 0

## 2017-11-24 MED ORDER — IBUPROFEN 600 MG PO TABS: 600.0000 mg | ORAL_TABLET | Freq: Four times a day (QID) | ORAL | 0 refills | Status: DC

## 2017-11-24 NOTE — Discharge Instructions (Signed)
Postpartum Care After Vaginal Delivery °The period of time right after you deliver your newborn is called the postpartum period. °What kind of medical care will I receive? °· You may continue to receive fluids and medicines through an IV tube inserted into one of your veins. °· If an incision was made near your vagina (episiotomy) or if you had some vaginal tearing during delivery, cold compresses may be placed on your episiotomy or your tear. This helps to reduce pain and swelling. °· You may be given a squirt bottle to use when you go to the bathroom. You may use this until you are comfortable wiping as usual. To use the squirt bottle, follow these steps: °? Before you urinate, fill the squirt bottle with warm water. Do not use hot water. °? After you urinate, while you are sitting on the toilet, use the squirt bottle to rinse the area around your urethra and vaginal opening. This rinses away any urine and blood. °? You may do this instead of wiping. As you start healing, you may use the squirt bottle before wiping yourself. Make sure to wipe gently. °? Fill the squirt bottle with clean water every time you use the bathroom. °· You will be given sanitary pads to wear. °How can I expect to feel? °· You may not feel the need to urinate for several hours after delivery. °· You will have some soreness and pain in your abdomen and vagina. °· If you are breastfeeding, you may have uterine contractions every time you breastfeed for up to several weeks postpartum. Uterine contractions help your uterus return to its normal size. °· It is normal to have vaginal bleeding (lochia) after delivery. The amount and appearance of lochia is often similar to a menstrual period in the first week after delivery. It will gradually decrease over the next few weeks to a dry, yellow-brown discharge. For most women, lochia stops completely by 6-8 weeks after delivery. Vaginal bleeding can vary from woman to woman. °· Within the first few  days after delivery, you may have breast engorgement. This is when your breasts feel heavy, full, and uncomfortable. Your breasts may also throb and feel hard, tightly stretched, warm, and tender. After this occurs, you may have milk leaking from your breasts. Your health care provider can help you relieve discomfort due to breast engorgement. Breast engorgement should go away within a few days. °· You may feel more sad or worried than normal due to hormonal changes after delivery. These feelings should not last more than a few days. If these feelings do not go away after several days, speak with your health care provider. °How should I care for myself? °· Tell your health care provider if you have pain or discomfort. °· Drink enough water to keep your urine clear or pale yellow. °· Wash your hands thoroughly with soap and water for at least 20 seconds after changing your sanitary pads, after using the toilet, and before holding or feeding your baby. °· If you are not breastfeeding, avoid touching your breasts a lot. Doing this can make your breasts produce more milk. °· If you become weak or lightheaded, or you feel like you might faint, ask for help before: °? Getting out of bed. °? Showering. °· Change your sanitary pads frequently. Watch for any changes in your flow, such as a sudden increase in volume, a change in color, the passing of large blood clots. If you pass a blood clot from your vagina, save it   to show to your health care provider. Do not flush blood clots down the toilet without having your health care provider look at them. °· Make sure that all your vaccinations are up to date. This can help protect you and your baby from getting certain diseases. You may need to have immunizations done before you leave the hospital. °· If desired, talk with your health care provider about methods of family planning or birth control (contraception). °How can I start bonding with my baby? °Spending as much time as  possible with your baby is very important. During this time, you and your baby can get to know each other and develop a bond. Having your baby stay with you in your room (rooming in) can give you time to get to know your baby. Rooming in can also help you become comfortable caring for your baby. Breastfeeding can also help you bond with your baby. °How can I plan for returning home with my baby? °· Make sure that you have a car seat installed in your vehicle. °? Your car seat should be checked by a certified car seat installer to make sure that it is installed safely. °? Make sure that your baby fits into the car seat safely. °· Ask your health care provider any questions you have about caring for yourself or your baby. Make sure that you are able to contact your health care provider with any questions after leaving the hospital. °This information is not intended to replace advice given to you by your health care provider. Make sure you discuss any questions you have with your health care provider. °Document Released: 07/17/2007 Document Revised: 02/22/2016 Document Reviewed: 08/24/2015 °Elsevier Interactive Patient Education © 2018 [employer] ° °

## 2017-11-24 NOTE — Lactation Note (Addendum)
This note was copied from a baby's chart. Lactation Consultation Note Baby 35 hrs old. FOB holding baby. Mom states BF going well. Mom has flat nipples, breast filling. Discussed milk storage, filling, breast massage, engorgement, support and comfort during feeding, obtaining a deep latch, chin tug, wearing supportive bra. Noted one purple red bruise to areola. Mom stated she had fell asleep and didn't know he was doing that.  Shells given and hand pump. Encouraged not to let breast get to full and us unable to latch.  Encouraged STS and I&O to cont.  Mom has WIC. Encouraged to call if needs to make f/u or needs assistance. Reminded of OP services. Mom states she feels comfortable going home BF.  Patient Name: Boy Darryl Lentmanda Bracco ZOXWR'UToday's Date: 11/24/2017 Reason for consult: Follow-up assessment   Maternal Data    Feeding Feeding Type: Breast Fed  LATCH Score       Type of Nipple: Flat  Comfort (Breast/Nipple): Filling, red/small blisters or bruises, mild/mod discomfort        Interventions Interventions: Breast feeding basics reviewed;Shells;Comfort gels;Support pillows;Hand pump;Breast massage;Hand express;Position options;Pre-pump if needed  Lactation Tools Discussed/Used Tools: Shells;Pump;Comfort gels Shell Type: Inverted Breast pump type: Manual   Consult Status Consult Status: Complete Date: 11/24/17    Charyl DancerCARVER, Sibel Khurana G 11/24/2017, 8:27 AM

## 2017-11-24 NOTE — Discharge Summary (Signed)
OB Discharge Summary     Patient Name: Christine Orozco DOB: 07/31/1986 MRN: [id number]  Date of admission: 11/22/2017 Delivering MD: Sharyon CableOGERS, VERONICA C   Date of discharge: 11/24/2017  Admitting diagnosis: 38 WEEKS CTX Intrauterine pregnancy: 6729w2d     Secondary diagnosis:  Active Problems:   Normal labor  Additional problems: none     Discharge diagnosis: Term Pregnancy Delivered                                                                                                Post partum procedures:none  Augmentation: Pitocin  Complications: None  Hospital course:  Onset of Labor With Vaginal Delivery     32 y.o. yo [id number] at 7529w2d was admitted in Active Labor on 11/22/2017. Patient had an uncomplicated labor course as follows:  Membrane Rupture Time/Date: 2:50 AM ,11/22/2017   Intrapartum Procedures: Episiotomy: None [1]                                         Lacerations:  Labial [10]  Patient had a delivery of a Viable infant. 11/22/2017  Information for the patient's newborn:  Edwena Bundeaylor, Boy Stana [[id number]][[id number]]  Delivery Method: Vaginal, Spontaneous(Filed from Delivery Summary)    Pateint had an uncomplicated postpartum course.  She is ambulating, tolerating a regular diet, passing flatus, and urinating well. Patient is discharged home in stable condition on 11/24/17.   Physical exam  Vitals:   11/23/17 0500 11/23/17 0940 11/23/17 1754 11/24/17 0500  BP: 121/69 128/66 (!) 119/50 111/67  Pulse: 76 84 77 68  Resp: 18 18 17 19   Temp: 97.8 F (36.6 C) 98.3 F (36.8 C) 98 F (36.7 C) 98.2 F (36.8 C)  TempSrc:   Oral Axillary  SpO2: 98%  97%   Weight:      Height:       General: alert, cooperative and no distress Lochia: appropriate Uterine Fundus: firm Incision: N/A DVT Evaluation: No evidence of DVT seen on physical exam. Negative Homan's sign. No cords or calf tenderness. Labs: Lab Results  Component Value Date   WBC 17.4 (H) 11/22/2017   HGB 10.5 (L)  11/22/2017   HCT 31.4 (L) 11/22/2017   MCV 83.7 11/22/2017   PLT 199 11/22/2017   CMP Latest Ref Rng & Units 04/19/2017  Glucose 65 - 99 mg/dL 132(G100(H)  BUN 6 - 20 mg/dL 10  Creatinine 4.010.44 - 0.271.00 mg/dL 2.530.74  Sodium 664135 - 403145 mmol/L 136  Potassium 3.5 - 5.1 mmol/L 3.4(L)  Chloride 101 - 111 mmol/L 106  CO2 22 - 32 mmol/L 22  Calcium 8.9 - 10.3 mg/dL 9.0  Total Protein 6.5 - 8.1 g/dL 7.0  Total Bilirubin 0.3 - 1.2 mg/dL 0.7  Alkaline Phos 38 - 126 U/L 55  AST 15 - 41 U/L 21  ALT 14 - 54 U/L 18    Discharge instruction: per After Visit Summary and "Baby and Me Booklet".  After visit meds:  Allergies as of 11/24/2017  Reactions   Beef-derived Products Nausea And Vomiting, Other (See Comments)   Actively vomits. Alpha-gal allergy.     Ultram [tramadol] Other (See Comments)   Hallucinate and heart stop   Allegra [fexofenadine] Palpitations, Other (See Comments)   hyperactive   Augmentin [amoxicillin-pot Clavulanate] Other (See Comments)   Childhood, pt states she can take penicillin Has patient had a PCN reaction causing immediate rash, facial/tongue/throat swelling, SOB or lightheadedness with hypotension: No Has patient had a PCN reaction causing severe rash involving mucus membranes or skin necrosis: No Has patient had a PCN reaction that required hospitalization No Has patient had a PCN reaction occurring within the last 10 years: No If all of the above answers are "NO", then may proceed with Cephalosporin use.   Benadryl [diphenhydramine] Hives, Palpitations   Naldecon Senior [guaifenesin] Nausea And Vomiting, Other (See Comments)   Hives    Sudafed [pseudoephedrine] Palpitations, Other (See Comments)   hyperactivity    Zyrtec [cetirizine] Palpitations, Other (See Comments)   Hyperactive       Medication List    STOP taking these medications   acetaminophen 500 MG tablet Commonly known as:  TYLENOL   aspirin EC 81 MG tablet   hydrocortisone 2.5 % rectal  cream Commonly known as:  ANUSOL-HC   Hydrocortisone Acetate 1 % Crea   pantoprazole 20 MG tablet Commonly known as:  PROTONIX   polyethylene glycol powder powder Commonly known as:  GLYCOLAX/MIRALAX   witch hazel-glycerin pad Commonly known as:  TUCKS     TAKE these medications   ibuprofen 600 MG tablet Commonly known as:  ADVIL,MOTRIN Take 1 tablet (600 mg total) by mouth every 6 (six) hours.   PRENATAL GUMMIES/DHA & FA 0.4-32.5 MG Chew Chew 2 capsules by mouth daily.       Diet: routine diet  Activity: Advance as tolerated. Pelvic rest for 6 weeks.   Outpatient follow up: Follow up Appt: Future Appointments  Date Time Provider Department Center  12/21/2017  1:00 PM Anyanwu, Jethro Bastos, MD [facility] None   Follow up Visit:No Follow-up on file.  Postpartum contraception: Depo Provera  Newborn Data: Live born female  Birth Weight: 6 lb 13.3 oz (3099 g) APGAR: 9, 9  Newborn Delivery   Birth date/time:  11/22/2017 17:52:00 Delivery type:  Vaginal, Spontaneous     Baby Feeding: Breast Disposition:home with mother   11/24/2017 Jacklyn Shell, CNM

## 2017-11-25 LAB — BPAM RBC
BLOOD PRODUCT EXPIRATION DATE: 201903092359
Blood Product Expiration Date: 201903092359
UNIT TYPE AND RH: 9500
UNIT TYPE AND RH: 9500

## 2017-11-25 LAB — TYPE AND SCREEN
ABO/RH(D): O NEG
Antibody Screen: POSITIVE
UNIT DIVISION: 0
UNIT DIVISION: 0

## 2017-11-28 ENCOUNTER — Encounter: Payer: Medicaid Other | Admitting: Obstetrics & Gynecology

## 2017-12-21 ENCOUNTER — Other Ambulatory Visit: Payer: Self-pay

## 2017-12-21 ENCOUNTER — Ambulatory Visit (INDEPENDENT_AMBULATORY_CARE_PROVIDER_SITE_OTHER): Payer: Medicaid Other | Admitting: Obstetrics & Gynecology

## 2017-12-21 ENCOUNTER — Encounter: Payer: Self-pay | Admitting: Obstetrics & Gynecology

## 2017-12-21 DIAGNOSIS — Z3042 Encounter for surveillance of injectable contraceptive: Secondary | ICD-10-CM | POA: Diagnosis not present

## 2017-12-21 DIAGNOSIS — Z3202 Encounter for pregnancy test, result negative: Secondary | ICD-10-CM | POA: Diagnosis not present

## 2017-12-21 DIAGNOSIS — Z1389 Encounter for screening for other disorder: Secondary | ICD-10-CM

## 2017-12-21 LAB — POCT URINE PREGNANCY: Preg Test, Ur: NEGATIVE

## 2017-12-21 MED ORDER — MEDROXYPROGESTERONE ACETATE 150 MG/ML IM SUSP: 150.0000 mg | INTRAMUSCULAR | 3 refills | Status: DC

## 2017-12-21 MED ORDER — MEDROXYPROGESTERONE ACETATE 150 MG/ML IM SUSP
150.0000 mg | Freq: Once | INTRAMUSCULAR | Status: AC
  Administered 2017-12-21: 150 mg via INTRAMUSCULAR

## 2017-12-21 NOTE — Progress Notes (Signed)
Administrations This Visit    medroxyPROGESTERone (DEPO-PROVERA) injection 150 mg    Admin Date 12/21/2017 Action Given Dose 150 mg Route Intramuscular Administered By Maretta BeesMcGlashan, Juanito Gonyer J, RMA         Next DEPO Due: 6/6-20/2019

## 2017-12-21 NOTE — Patient Instructions (Signed)
Return to clinic for any scheduled appointments or for any gynecologic concerns as needed.   

## 2017-12-21 NOTE — Progress Notes (Signed)
Postpartum Visit  Subjective:     Christine Orozco is a 32 y.o. [phone] female who presents for a postpartum visit. She is 4 weeks postpartum following a spontaneous vaginal delivery. I have fully reviewed the prenatal and intrapartum course. The delivery was at 38.2 gestational weeks. Outcome: spontaneous vaginal delivery. Anesthesia: epidural. Postpartum course has been UNREMARKABLE. Baby's course has been UNREMARKABLE. Baby is feeding by breast. Bleeding no bleeding. Bowel function is normal. Bladder function is normal. Patient is not sexually active. Contraception method is none; desires Depo-Provera. Postpartum depression screening: negative.  The following portions of the patient's history were reviewed and updated as appropriate: allergies, current medications, past family history, past medical history, past social history, past surgical history and problem list.  Review of Systems Pertinent items noted in HPI and remainder of comprehensive ROS otherwise negative. Normal pap and negative HPV in 05/31/2017.  Objective:    BP 121/76   Pulse 97   Ht 5\' 3"  (1.6 m)   Wt 158 lb 12.8 oz (72 kg)   LMP 02/27/2017   Breastfeeding? Yes   BMI 28.13 kg/m   General:  alert and no distress   Breasts:  inspection negative, no nipple discharge or bleeding, no masses or nodularity palpable  Lungs: clear to auscultation bilaterally  Heart:  regular rate and rhythm  Abdomen: soft, non-tender; bowel sounds normal; no masses,  no organomegaly  Pelvic:  not evaluated     Clinic UPT: Negative   Assessment:   Normal postpartum exam. Pap smear not done at today's visit.   Plan:   1. Contraception: Depo-Provera injections 2. No other postpartum concerns 3. Follow up in: 3 months for Depo Provera or as needed.

## 2018-03-11 ENCOUNTER — Encounter: Payer: Self-pay | Admitting: Obstetrics & Gynecology

## 2018-03-14 ENCOUNTER — Ambulatory Visit (INDEPENDENT_AMBULATORY_CARE_PROVIDER_SITE_OTHER): Payer: Medicaid Other

## 2018-03-14 VITALS — BP 117/80 | HR 73 | Wt 161.6 lb

## 2018-03-14 DIAGNOSIS — Z3042 Encounter for surveillance of injectable contraceptive: Secondary | ICD-10-CM | POA: Diagnosis not present

## 2018-03-14 MED ORDER — MEDROXYPROGESTERONE ACETATE 150 MG/ML IM SUSP
150.0000 mg | INTRAMUSCULAR | Status: DC
  Administered 2018-03-14: 150 mg via INTRAMUSCULAR

## 2018-03-14 NOTE — Progress Notes (Signed)
Pt here for depo injection. Inj given in L arm, pt tolerated well.

## 2018-03-29 ENCOUNTER — Encounter (HOSPITAL_COMMUNITY): Payer: Self-pay | Admitting: Emergency Medicine

## 2018-03-29 ENCOUNTER — Emergency Department (HOSPITAL_COMMUNITY)
Admission: EM | Admit: 2018-03-29 | Discharge: 2018-03-29 | Disposition: A | Discharge status: Home / Self Care | Payer: Medicaid Other | Attending: Emergency Medicine | Admitting: Emergency Medicine

## 2018-03-29 DIAGNOSIS — Y998 Other external cause status: Secondary | ICD-10-CM | POA: Diagnosis not present

## 2018-03-29 DIAGNOSIS — Y93F2 Activity, caregiving, lifting: Secondary | ICD-10-CM | POA: Diagnosis not present

## 2018-03-29 DIAGNOSIS — Z79899 Other long term (current) drug therapy: Secondary | ICD-10-CM | POA: Insufficient documentation

## 2018-03-29 DIAGNOSIS — Y929 Unspecified place or not applicable: Secondary | ICD-10-CM | POA: Diagnosis not present

## 2018-03-29 DIAGNOSIS — Z87891 Personal history of nicotine dependence: Secondary | ICD-10-CM | POA: Insufficient documentation

## 2018-03-29 DIAGNOSIS — X500XXA Overexertion from strenuous movement or load, initial encounter: Secondary | ICD-10-CM | POA: Insufficient documentation

## 2018-03-29 DIAGNOSIS — S39012A Strain of muscle, fascia and tendon of lower back, initial encounter: Secondary | ICD-10-CM | POA: Insufficient documentation

## 2018-03-29 DIAGNOSIS — S3992XA Unspecified injury of lower back, initial encounter: Secondary | ICD-10-CM | POA: Diagnosis present

## 2018-03-29 MED ORDER — IBUPROFEN 800 MG PO TABS: 800.0000 mg | ORAL_TABLET | Freq: Three times a day (TID) | ORAL | 0 refills | Status: DC

## 2018-03-29 MED ORDER — KETOROLAC TROMETHAMINE 60 MG/2ML IM SOLN
60.0000 mg | Freq: Once | INTRAMUSCULAR | Status: AC
  Administered 2018-03-29: 60 mg via INTRAMUSCULAR
  Filled 2018-03-29: qty 2

## 2018-03-29 MED ORDER — CYCLOBENZAPRINE HCL 5 MG PO TABS: 5.0000 mg | ORAL_TABLET | Freq: Three times a day (TID) | ORAL | 0 refills | Status: DC | PRN

## 2018-03-29 NOTE — ED Provider Notes (Signed)
[facility]-EMERGENCY DEPT Provider Note   CSN: [id number] Arrival date & time: 03/29/18  [id number]     History   Chief Complaint Chief Complaint  Patient presents with  . Back Pain    HPI Christine Orozco is a 32 y.o. female otherwise healthy here with back pain.  Patient states that she picked up her baby in groceries and was walking up 3 flights of stairs 2 days ago and then subsequently have gradually worsening back pain.  She states that the pain is worse on the left side but sometimes when she walks she noticed radiation of the pain down to the right knee.  She states that she had a lot of cramps yesterday and took 800 mg of ibuprofen and today felt slightly better.  Denies any fall or injury to the back.  Denies previous history of back pain or back surgeries.  Denies any fevers or chills or trouble urinating.  Patient is currently on Depo-Provera and denies being pregnant.  The history is provided by the patient.    Past Medical History:  Diagnosis Date  . Hepatitis B antibody positive   . Vaginal Pap smear, abnormal     There are no active problems to display for this patient.   Past Surgical History:  Procedure Laterality Date  . CERVICAL BIOPSY  W/ LOOP ELECTRODE EXCISION    . extraction of wisdom teeth       OB History    Gravida  3   Para  2   Term  2   Preterm      AB  1   Living  2     SAB  1   TAB      Ectopic      Multiple  0   Live Births  2            Home Medications    Prior to Admission medications   Medication Sig Start Date End Date Taking? Authorizing Provider  ibuprofen (ADVIL,MOTRIN) 600 MG tablet Take 1 tablet (600 mg total) by mouth every 6 (six) hours. 11/24/17   Cresenzo-Dishmon, Scarlette CalicoFrances, CNM  medroxyPROGESTERone (DEPO-PROVERA) 150 MG/ML injection Inject 1 mL (150 mg total) into the muscle every 3 (three) months. 12/21/17   Anyanwu, Jethro BastosUgonna A, MD  Prenatal MV-Min-FA-Omega-3 (PRENATAL GUMMIES/DHA & FA)  0.4-32.5 MG CHEW Chew 2 capsules by mouth daily.     [provider]    Family History Family History  Adopted: Yes    Social History Social History   Tobacco Use  . Smoking status: Former Smoker    Packs/day: 0.25  . Smokeless tobacco: Never Used  Substance Use Topics  . Alcohol use: No    Frequency: Never  . Drug use: No     Allergies   Beef-derived products; Ultram [tramadol]; Allegra [fexofenadine]; Augmentin [amoxicillin-pot clavulanate]; Benadryl [diphenhydramine]; Naldecon senior [guaifenesin]; Sudafed [pseudoephedrine]; and Zyrtec [cetirizine]   Review of Systems Review of Systems  Musculoskeletal: Positive for back pain.  All other systems reviewed and are negative.    Physical Exam Updated Vital Signs BP (!) 133/95 (BP Location: Right Arm)   Pulse 81   Temp 98.1 F (36.7 C) (Oral)   Resp 17   Ht 5\' 3"  (1.6 m)   Wt 73.3 kg (161 lb 9 oz)   LMP 01/27/2018   SpO2 98%   BMI 28.62 kg/m   Physical Exam  Constitutional: She is oriented to person, place, and time.  Slightly uncomfortable  HENT:  Head: Normocephalic.  Mouth/Throat: Oropharynx is clear and moist.  Eyes: Pupils are equal, round, and reactive to light. Conjunctivae and EOM are normal.  Neck: Normal range of motion. Neck supple.  Cardiovascular: Normal rate, regular rhythm and normal heart sounds.  Pulmonary/Chest: Effort normal and breath sounds normal. No stridor. No respiratory distress. She has no wheezes.  Abdominal: Soft. Bowel sounds are normal. She exhibits no distension. There is no tenderness. There is no guarding.  Musculoskeletal:  L paralumbar tenderness vs SI joint tenderness. Nl ROM bilateral hips. + straight leg raise on the right   Neurological: She is alert and oriented to person, place, and time.  No saddle anesthesia. Nl strength throughout. Nl gait   Skin: Skin is warm.  Psychiatric: She has a normal mood and affect.  Nursing note and vitals  reviewed.    ED Treatments / Results  Labs (all labs ordered are listed, but only abnormal results are displayed) Labs Reviewed - No data to display  EKG None  Radiology No results found.  Procedures Procedures (including critical care time)  Medications Ordered in ED Medications  ketorolac (TORADOL) injection 60 mg (has no administration in time range)     Initial Impression / Assessment and Plan / ED Course  I have reviewed the triage vital signs and the nursing notes.  Pertinent labs & imaging results that were available during my care of the patient were reviewed by me and considered in my medical decision making (see chart for details).     Christine Orozco is a 32 y.o. female here with back pain. Likely back strain vs sciatica vs SI joint pain. No trauma or injury, neurovascular intact. Improved with motrin yesterday. Will dc home with motrin, flexeril prn. Told her to avoid heavy lifting.   Final Clinical Impressions(s) / ED Diagnoses   Final diagnoses:  None    ED Discharge Orders    None       Charlynne Pander, MD 03/29/18 [phone]

## 2018-03-29 NOTE — ED Triage Notes (Signed)
Pt c/o lower mid back pain since Tuesday when she was carrying 16lb baby and groceries up to 3rd floor apartment. Denies any falls or injuries.

## 2018-03-29 NOTE — Discharge Instructions (Signed)
Take motrin for pain,   Take flexeril for muscle spasms.   Avoid heavy lifting.  Rest today and tomorrow  See your doctor  Return to ER if you have worse back pain, trouble walking, weakness, numbness

## 2018-05-21 ENCOUNTER — Ambulatory Visit: Payer: Medicaid Other

## 2018-05-25 ENCOUNTER — Emergency Department (HOSPITAL_COMMUNITY)
Admission: EM | Admit: 2018-05-25 | Discharge: 2018-05-25 | Disposition: A | Discharge status: Home / Self Care | Payer: Medicaid Other | Attending: Emergency Medicine | Admitting: Emergency Medicine

## 2018-05-25 ENCOUNTER — Encounter (HOSPITAL_COMMUNITY): Payer: Self-pay | Admitting: *Deleted

## 2018-05-25 ENCOUNTER — Emergency Department (HOSPITAL_COMMUNITY): Payer: Medicaid Other

## 2018-05-25 DIAGNOSIS — B9789 Other viral agents as the cause of diseases classified elsewhere: Secondary | ICD-10-CM | POA: Diagnosis not present

## 2018-05-25 DIAGNOSIS — Z87891 Personal history of nicotine dependence: Secondary | ICD-10-CM | POA: Insufficient documentation

## 2018-05-25 DIAGNOSIS — Z79899 Other long term (current) drug therapy: Secondary | ICD-10-CM | POA: Insufficient documentation

## 2018-05-25 DIAGNOSIS — R05 Cough: Secondary | ICD-10-CM | POA: Diagnosis present

## 2018-05-25 DIAGNOSIS — J069 Acute upper respiratory infection, unspecified: Secondary | ICD-10-CM | POA: Diagnosis not present

## 2018-05-25 MED ORDER — BENZONATATE 100 MG PO CAPS: 100.0000 mg | ORAL_CAPSULE | Freq: Three times a day (TID) | ORAL | 0 refills | Status: DC

## 2018-05-25 NOTE — ED Provider Notes (Signed)
[facility]-EMERGENCY DEPT Provider Note   CSN: [id number] Arrival date & time: 05/25/18  1116     History   Chief Complaint Chief Complaint  Patient presents with  . URI    HPI Christine Orozco is a 32 y.o. female with no significant past medical history presents emergency department today for URI symptoms.  Patient reports that she started developing nasal congestion, rhinorrhea, postnasal drip, dry, nonproductive cough approximately 3 days ago.  She reports that she thinks this happened as a result of being on the rain and then having to work for 8 hours in a cold environment.  She notes she has been taking Mucinex with some relief.  She denies any fever, headache, neck stiffness, rash, sinus pressure, dysphasia, odontophagia, abdominal pain, nausea/vomiting/diarrhea, myalgias or arthralgias.  She does note some chest pain upon coughing.  No shortness of breath. Denies risk factors for PE including exogenous estrogen use, recent surgery or travel, trauma, immobilization,  previous blood clot, hemoptysis, cancer, lower extremity pain or swelling.   HPI  Past Medical History:  Diagnosis Date  . Hepatitis B antibody positive   . Vaginal Pap smear, abnormal     There are no active problems to display for this patient.   Past Surgical History:  Procedure Laterality Date  . CERVICAL BIOPSY  W/ LOOP ELECTRODE EXCISION    . extraction of wisdom teeth       OB History    Gravida  3   Para  2   Term  2   Preterm      AB  1   Living  2     SAB  1   TAB      Ectopic      Multiple  0   Live Births  2            Home Medications    Prior to Admission medications   Medication Sig Start Date End Date Taking? Authorizing Provider  cyclobenzaprine (FLEXERIL) 5 MG tablet Take 1 tablet (5 mg total) by mouth 3 (three) times daily as needed for muscle spasms. 03/29/18   Charlynne PanderYao, David Hsienta, MD  ibuprofen (ADVIL,MOTRIN) 800 MG tablet Take 1 tablet  (800 mg total) by mouth 3 (three) times daily. 03/29/18   Charlynne PanderYao, David Hsienta, MD  medroxyPROGESTERone (DEPO-PROVERA) 150 MG/ML injection Inject 1 mL (150 mg total) into the muscle every 3 (three) months. Patient not taking: Reported on 03/29/2018 12/21/17   Tereso NewcomerAnyanwu, Ugonna A, MD  Multiple Vitamin (MULTIVITAMIN WITH MINERALS) TABS tablet Take 1 tablet by mouth daily.    [provider]  Prenatal MV-Min-FA-Omega-3 (PRENATAL GUMMIES/DHA & FA) 0.4-32.5 MG CHEW Chew 2 capsules by mouth daily.     [provider]    Family History Family History  Adopted: Yes    Social History Social History   Tobacco Use  . Smoking status: Former Smoker    Packs/day: 0.25  . Smokeless tobacco: Never Used  Substance Use Topics  . Alcohol use: No    Frequency: Never  . Drug use: No     Allergies   Beef-derived products; Ultram [tramadol]; Allegra [fexofenadine]; Augmentin [amoxicillin-pot clavulanate]; Benadryl [diphenhydramine]; Naldecon senior [guaifenesin]; Sudafed [pseudoephedrine]; and Zyrtec [cetirizine]   Review of Systems Review of Systems  All other systems reviewed and are negative.    Physical Exam Updated Vital Signs BP 92/62 (BP Location: Right Arm)   Pulse 95   Temp 98.1 F (36.7 C) (Oral)  Resp 16   LMP 12/24/2017 (Within Weeks)   SpO2 98%   Physical Exam  Constitutional: She appears well-developed and well-nourished.  HENT:  Head: Normocephalic and atraumatic.  Right Ear: Tympanic membrane and external ear normal.  Left Ear: Tympanic membrane and external ear normal.  Nose: Mucosal edema and rhinorrhea present. Right sinus exhibits no maxillary sinus tenderness and no frontal sinus tenderness. Left sinus exhibits no maxillary sinus tenderness and no frontal sinus tenderness.  Mouth/Throat: Uvula is midline, oropharynx is clear and moist and mucous membranes are normal. No tonsillar exudate.  The patient has normal phonation and is in control of  secretions. No stridor.  Midline uvula without edema. Soft palate rises symmetrically.  No tonsillar erythema or exudates. No PTA. Cobblestoning. Tongue protrusion is normal. No trismus. No creptius on neck palpation and patient has good dentition. No gingival erythema or fluctuance noted. Mucus membranes moist.   Eyes: Pupils are equal, round, and reactive to light. Right eye exhibits no discharge. Left eye exhibits no discharge. No scleral icterus.  Neck: Trachea normal. Neck supple. No spinous process tenderness present. No neck rigidity. Normal range of motion present.  No nuchal rigidity or meningismus  Cardiovascular: Normal rate, regular rhythm and intact distal pulses.  No murmur heard. Pulses:      Radial pulses are 2+ on the right side, and 2+ on the left side.       Dorsalis pedis pulses are 2+ on the right side, and 2+ on the left side.       Posterior tibial pulses are 2+ on the right side, and 2+ on the left side.  No lower extremity swelling or edema. Calves symmetric in size bilaterally.  Pulmonary/Chest: Effort normal and breath sounds normal. She exhibits no tenderness.  Abdominal: Soft. Bowel sounds are normal. There is no tenderness. There is no rebound and no guarding.  Musculoskeletal: She exhibits no edema.  Lymphadenopathy:    She has no cervical adenopathy.  Neurological: She is alert.  Skin: Skin is warm and dry. No rash noted. She is not diaphoretic.  Psychiatric: She has a normal mood and affect.  Nursing note and vitals reviewed.    ED Treatments / Results  Labs (all labs ordered are listed, but only abnormal results are displayed) Labs Reviewed - No data to display  EKG None  Radiology Dg Chest 2 View  Result Date: 05/25/2018 CLINICAL DATA:  Cough and chest pain.  Shortness of breath EXAM: CHEST - 2 VIEW COMPARISON:  October 11, 2014 FINDINGS: No edema or consolidation. Heart size and pulmonary vascularity are normal. No adenopathy. No pneumothorax. No  bone lesions. IMPRESSION: No edema or consolidation. Electronically Signed   By: Bretta Bang III M.D.   On: 05/25/2018 12:29    Procedures Procedures (including critical care time)  Medications Ordered in ED Medications - No data to display   Initial Impression / Assessment and Plan / ED Course  I have reviewed the triage vital signs and the nursing notes.  Pertinent labs & imaging results that were available during my care of the patient were reviewed by me and considered in my medical decision making (see chart for details).     32 y.o. female with nasal congestion, rhinorrhea, postnasal drip and cough.  Patient is PERC negative. Pt CXR negative for acute infiltrate. Patients symptoms are consistent with URI, likely viral etiology. Discussed that antibiotics are not indicated for viral infections. Pt will be discharged with symptomatic treatment.  Verbalizes understanding  and is agreeable with plan. Return precautions discussed. Pt is hemodynamically stable & in NAD prior to dc. She has appt with her PCP on Tuesday.   Final Clinical Impressions(s) / ED Diagnoses   Final diagnoses:  Viral URI with cough    ED Discharge Orders         Ordered    benzonatate (TESSALON) 100 MG capsule  Every 8 hours     05/25/18 1241           Princella Pellegrini 05/25/18 1242    Azalia Bilis, MD 05/25/18 [phone]

## 2018-05-25 NOTE — Discharge Instructions (Addendum)
Your chest x-ray did not show signs of pneumonia Continue Mucinex. Take Tessalon as needed for cough.  Follow up with your PCP during your scheduled appointment.  Follow attached handout.  If you develop worsening or new concerning symptoms you can return to the emergency department for re-evaluation.

## 2018-05-25 NOTE — ED Triage Notes (Signed)
Pt complains of cough, headache, sore throat, upper back pain that is worse when breathing/coughing for the past few days.

## 2018-05-28 ENCOUNTER — Ambulatory Visit: Payer: Medicaid Other

## 2018-06-05 IMAGING — US US MFM OB TRANSVAGINAL
1 series · 15 of 24 positions shown · non-contrast
Comparison: none

[Series 1: us mfm ob transvaginal · 24 acquisitions, 15 frames shown]
[im 1/24]
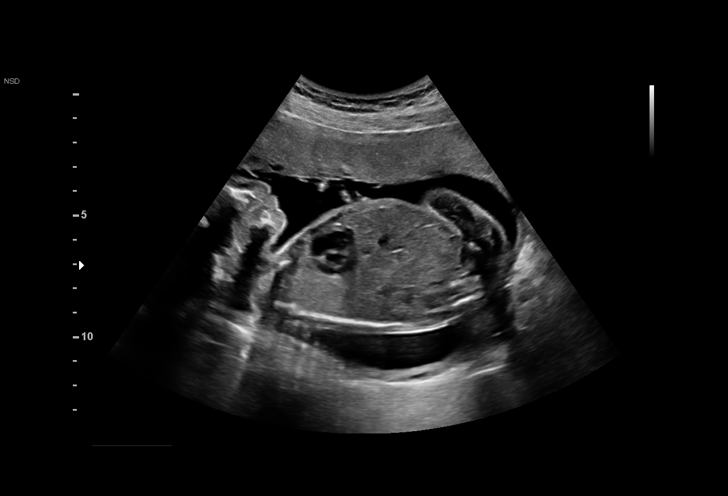
[im 3/24]
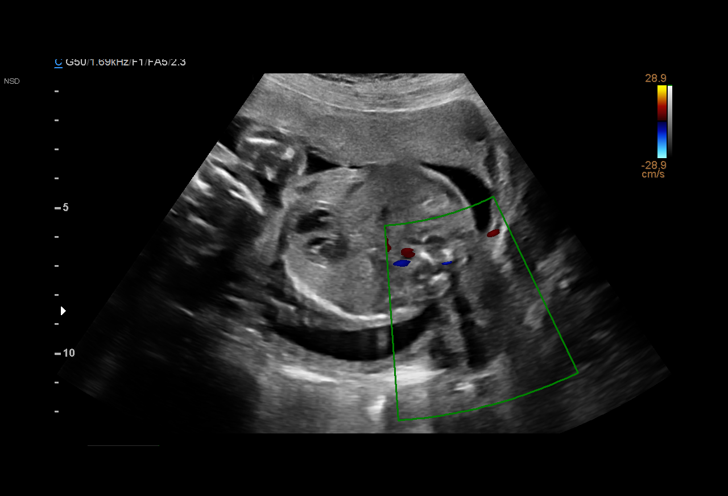
[im 5/24]
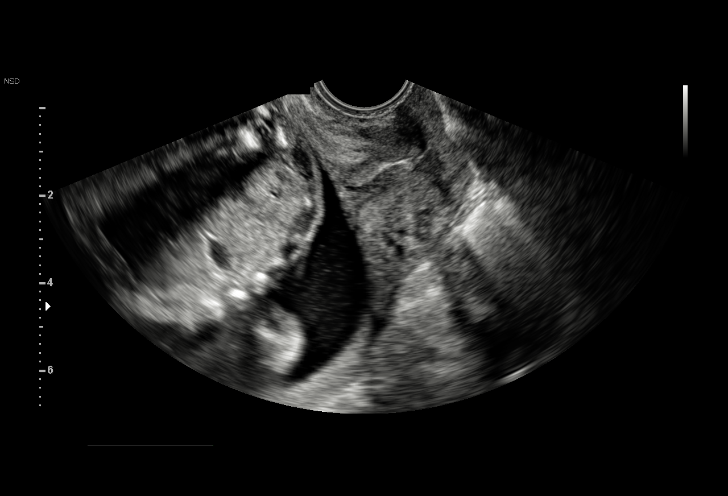
[im 6/24]
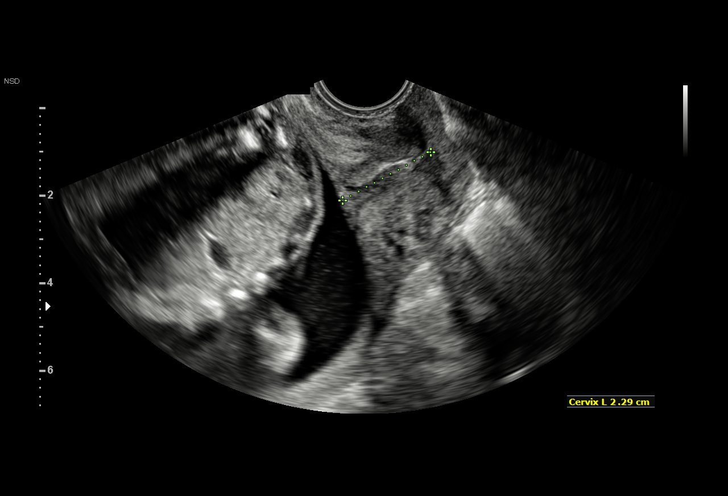
[im 8/24]
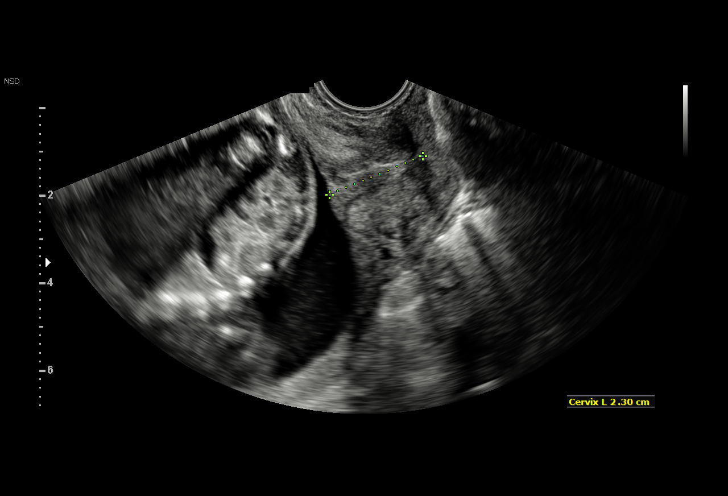
[im 9/24]
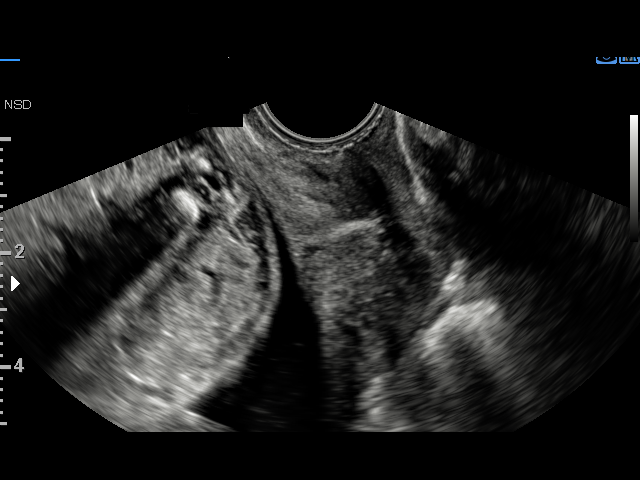
[im 11/24]
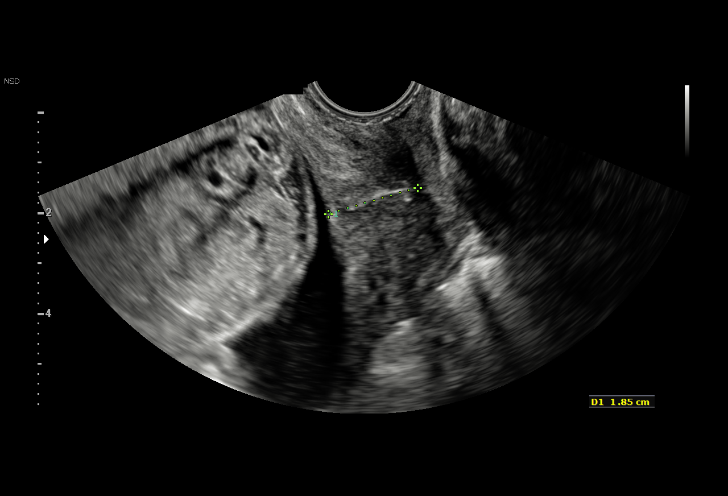
[im 13/24]
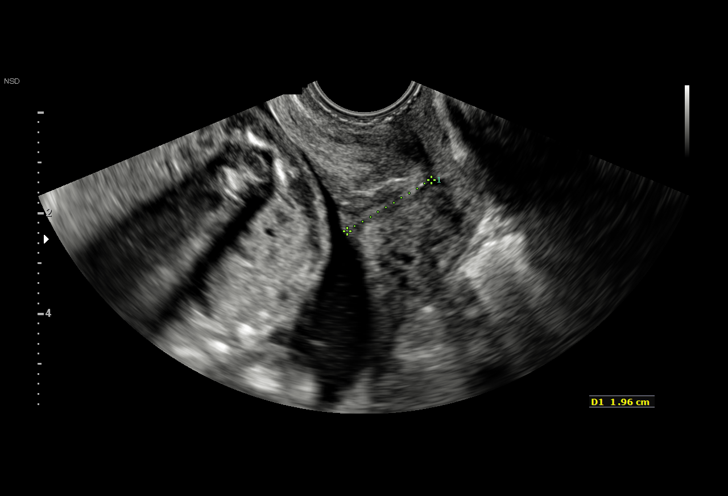
[im 14/24]
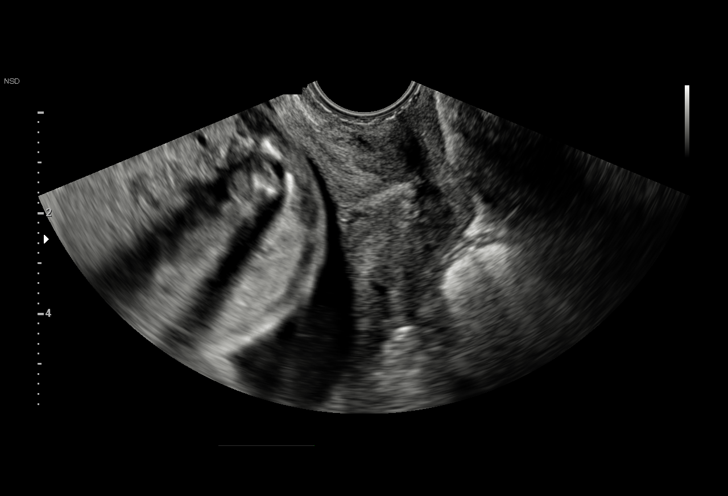
[im 16/24]
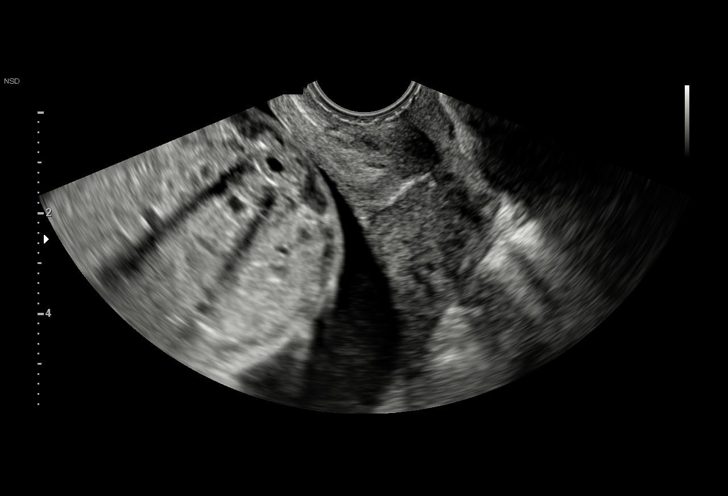
[im 17/24]
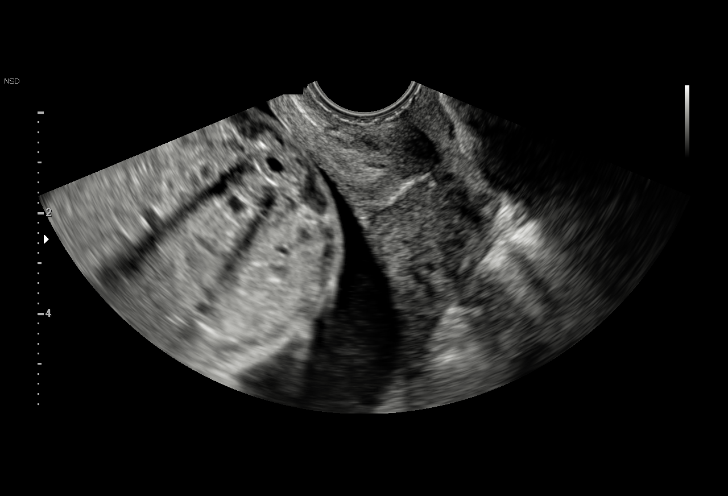
[im 19/24]
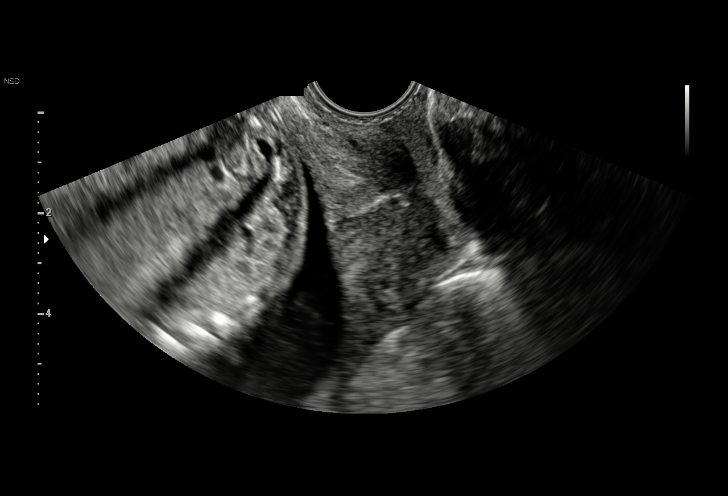
[im 21/24]
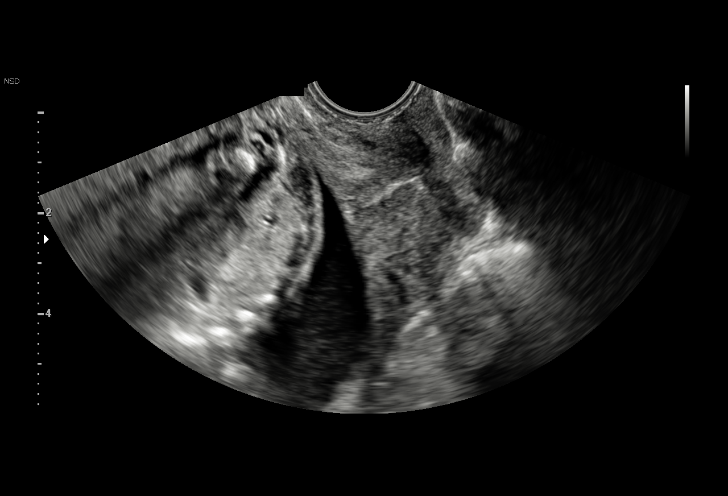
[im 22/24]
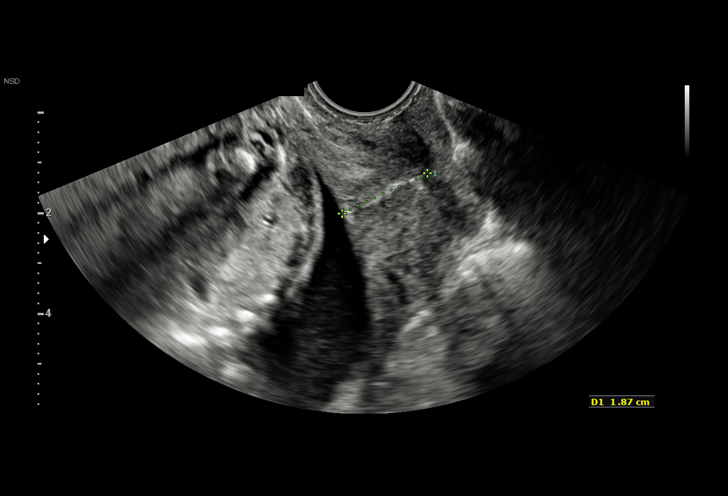
[im 24/24]
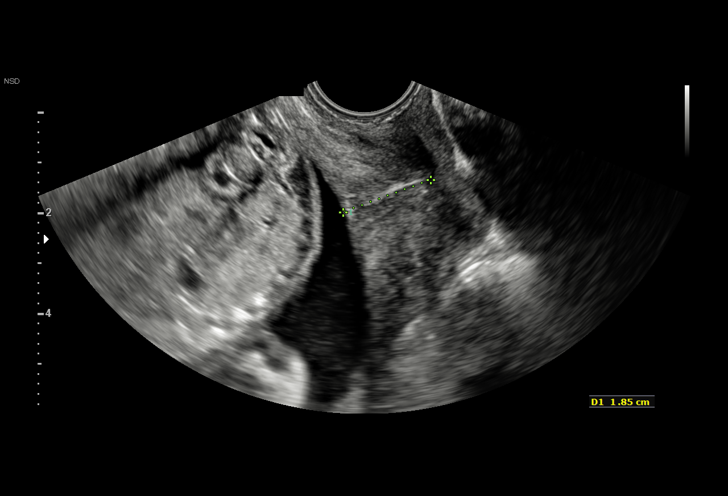

[15 of 24 positions shown; findings below may reference images not displayed]

[REDACTED]care -
[HOSPITAL]([facility])
Ref. Address:     Faculty

1  SAVIO LOCKLEAR           [id number]      [phone]     [phone]
Indications

22 weeks gestation of pregnancy
Cervical shortening, second trimester
Encounter for cervical length
OB History

Blood Type:            Height:  5'3"   Weight (lb):  138       BMI:
Gravidity:    3         Term:   1        Prem:   0        SAB:   1
TOP:          0       Ectopic:  0        Living: 1
Fetal Evaluation

Num Of Fetuses:     1
Fetal Heart         155
Rate(bpm):
Cardiac Activity:   Observed
Presentation:       Breech
Placenta:           Anterior, above cervical os
P. Cord Insertion:  Previously Visualized

Amniotic Fluid
AFI FV:      Subjectively within normal limits
Gestational Age
LMP:           22w 2d        Date:  02/27/17                 EDD:   12/04/17
Best:          22w 2d     Det. By:  LMP  (02/27/17)          EDD:   12/04/17
Cervix Uterus Adnexa

Cervix
Length:            1.9  cm.
Measured transvaginally.
Comments

The patient does not have a history of preterm birth.
Confirmed history of delivery at 53w4d and first trimester SAB
with patient today.
Impression

Single living intrauterine pregnancy at 22w 2d.
Breech presentation.
Placenta Anterior, above cervical os.
Normal amniotic fluid volume.
The cervix measures 1.9 cm transvaginally without funneling
or debris.
Recommendations

Continue vaginal progesterone.
Follow-up ultrasound for cervical length in one week.

## 2018-06-12 IMAGING — US US MFM OB TRANSVAGINAL
1 series · 15 of 15 positions shown · non-contrast
Comparison: none

[Series 1: us mfm ob transvaginal · 15 acquisitions, 15 frames shown]
[im 1/15]
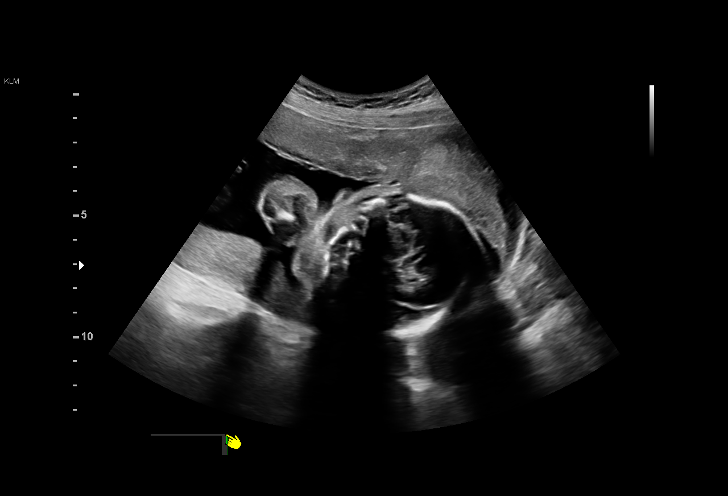
[im 2/15]
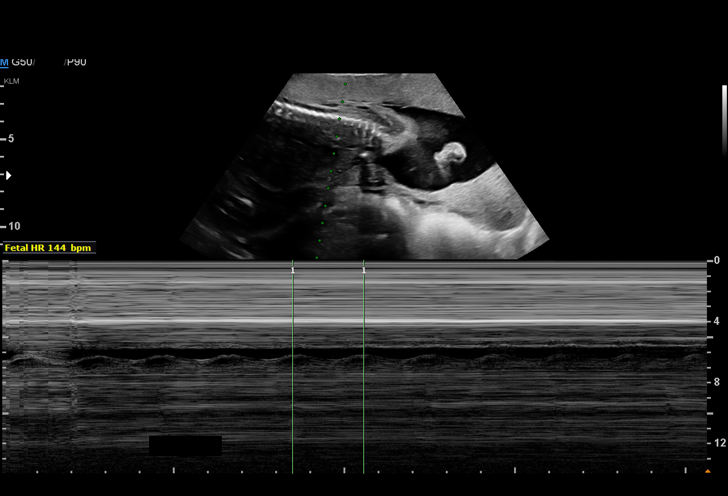
[im 3/15]
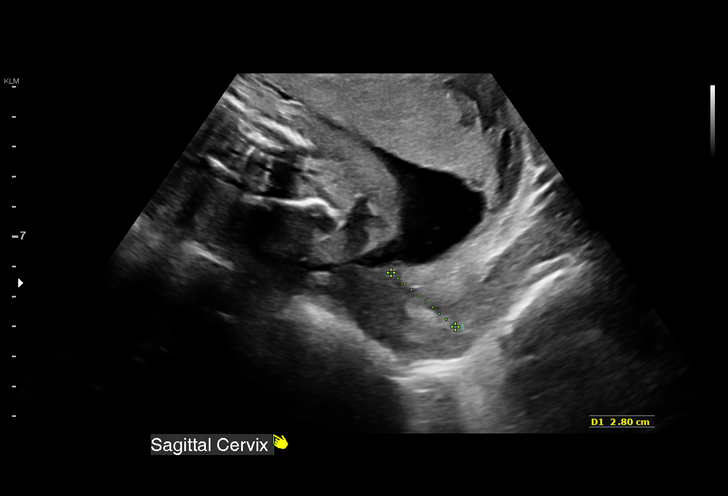
[im 4/15]
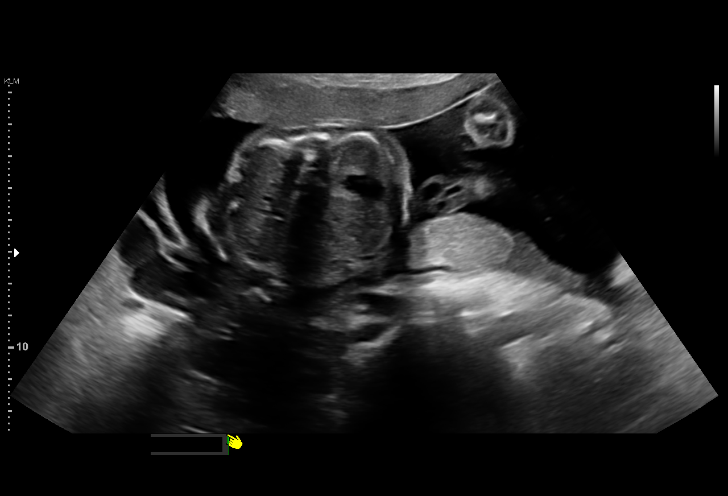
[im 5/15]
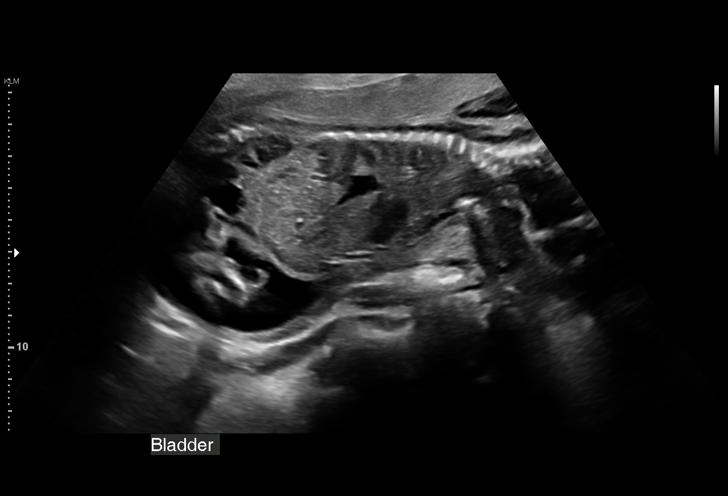
[im 6/15]
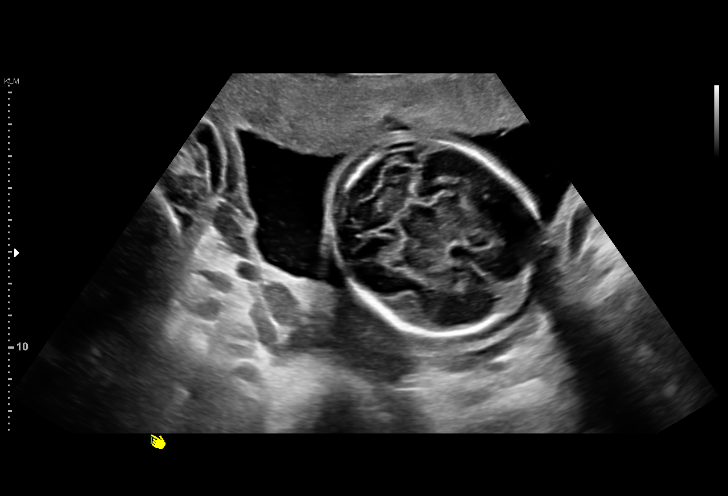
[im 7/15]
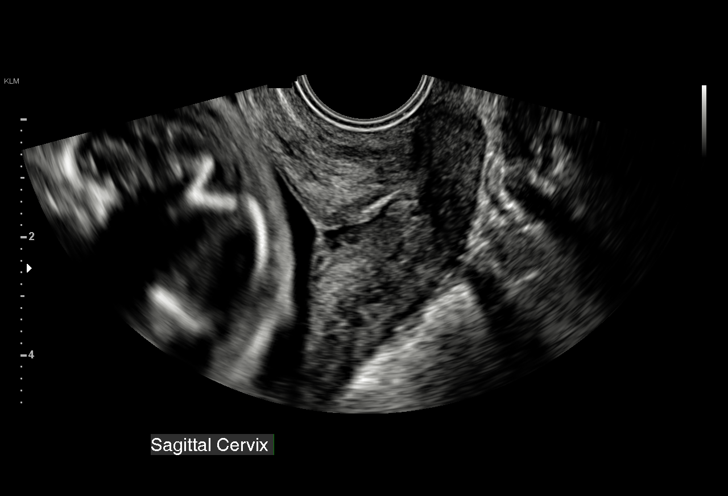
[im 8/15]
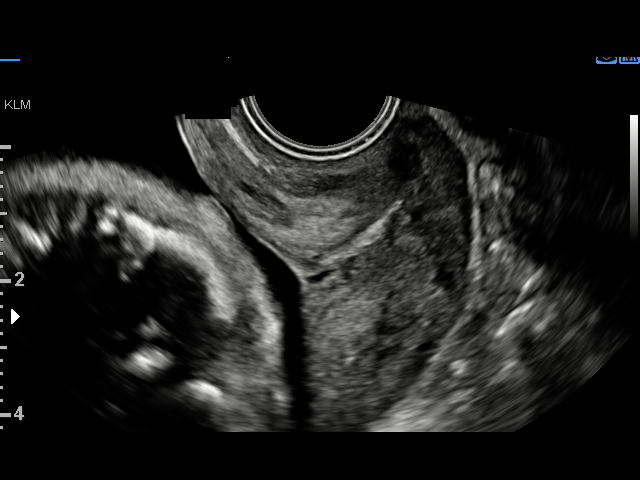
[im 9/15]
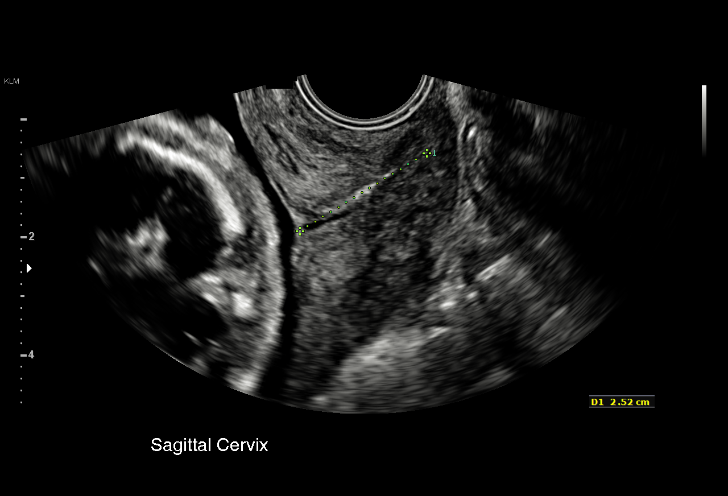
[im 10/15]
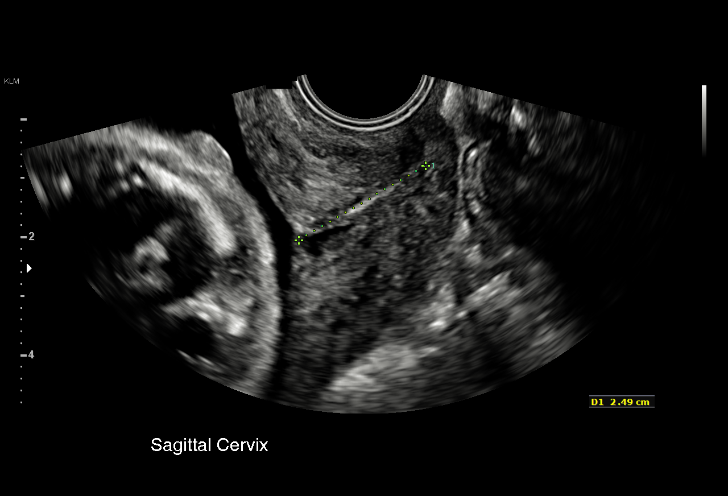
[im 11/15]
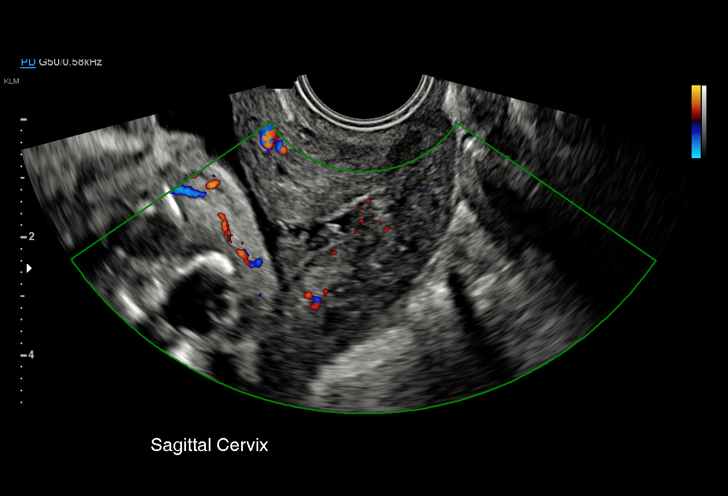
[im 12/15]
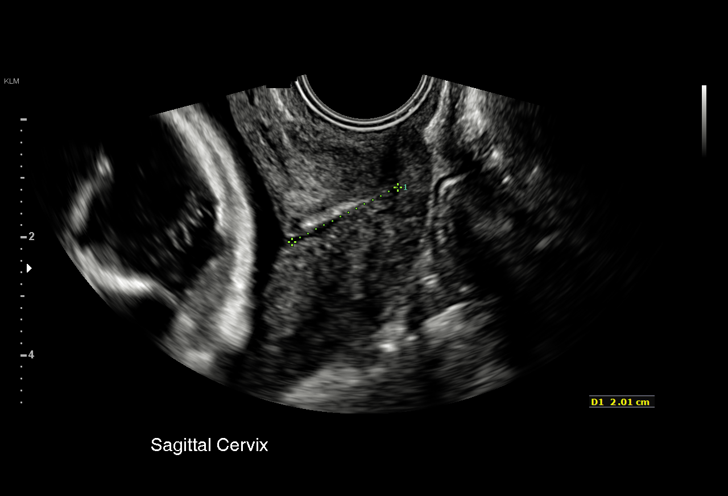
[im 13/15]
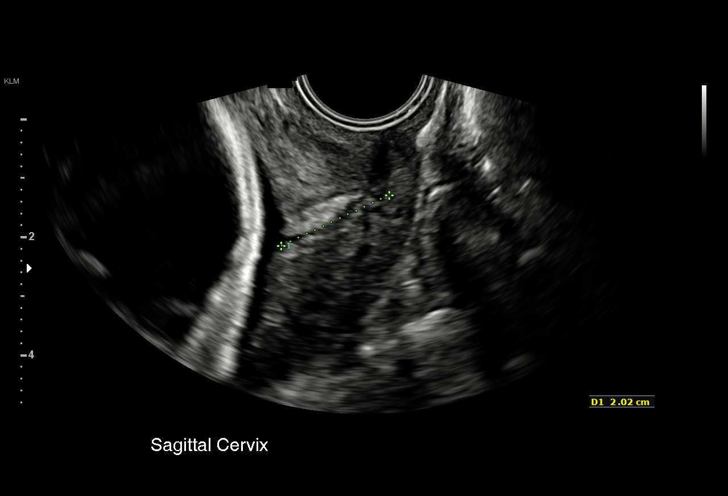
[im 14/15]
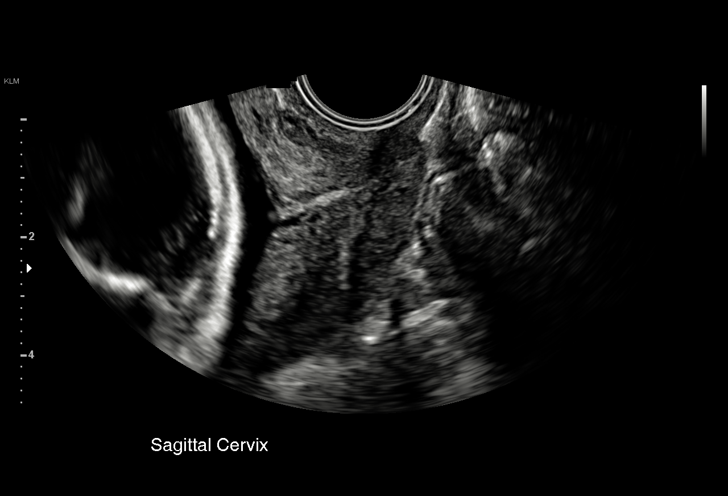
[im 15/15]
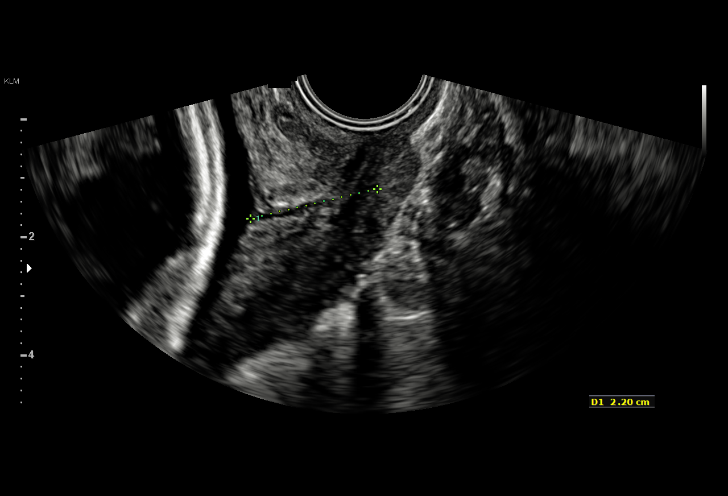

[15 of 15 positions shown; findings below may reference images not displayed]

[REDACTED]care -
[HOSPITAL]([facility])
Ref. Address:     Faculty

1  SUSMITHA FARRAR              [id number]      [phone]     [phone]
Indications

23 weeks gestation of pregnancy
Cervical shortening, second trimester;
vaginal progesterone
OB History

Blood Type:            Height:  5'3"   Weight (lb):  138       BMI:
Gravidity:    3         Term:   1        Prem:   0        SAB:   1
TOP:          0       Ectopic:  0        Living: 1
Fetal Evaluation

Num Of Fetuses:     1
Fetal Heart         144
Rate(bpm):
Cardiac Activity:   Observed
Presentation:       Cephalic

Amniotic Fluid
AFI FV:      Subjectively within normal limits
Gestational Age

LMP:           23w 2d        Date:  02/27/17                 EDD:   12/04/17
Best:          23w 2d     Det. By:  LMP  (02/27/17)          EDD:   12/04/17
Cervix Uterus Adnexa

Cervix
Length:            2.2  cm.
Measured transvaginally.
Impression

SIUP at 23+2 weeks
Normal amniotic fluid volume
EV views of cervix: shortened cervical length at 2.2 without
funneling; no significant change from last week
Recommendations

Continue nightly vaginal progesterone
CL in 2 weeks

## 2018-06-27 IMAGING — US US OB COMP LESS 14 WK
1 series · 14 of 28 positions shown · non-contrast
Comparison: None.

CLINICAL DATA: 31 y/o  F; 3 days of pelvic pain.

EXAM:
OBSTETRIC <14 WK US AND TRANSVAGINAL OB US
TECHNIQUE: Both transabdominal and transvaginal ultrasound examinations were
performed for complete evaluation of the gestation as well as the
maternal uterus, adnexal regions, and pelvic cul-de-sac.
Transvaginal technique was performed to assess early pregnancy.

[Series 1: us ob comp less 14 wk · 0.19mm/px · 14 of 141 slices shown]
[im 6/141]
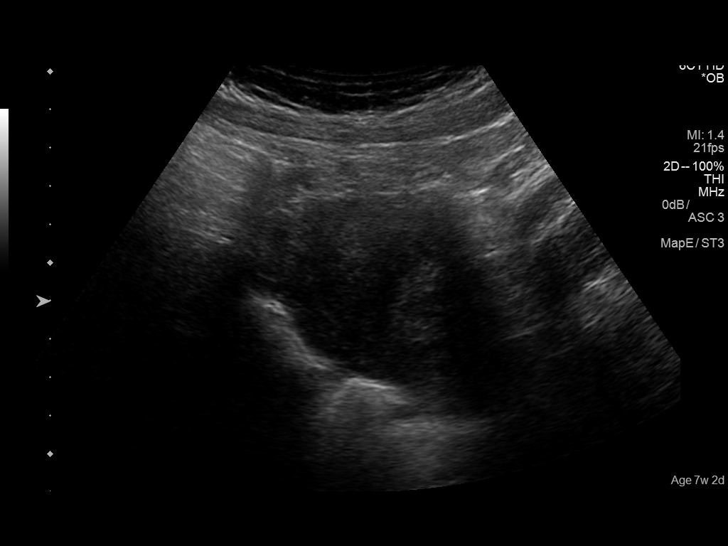
[im 16/141]
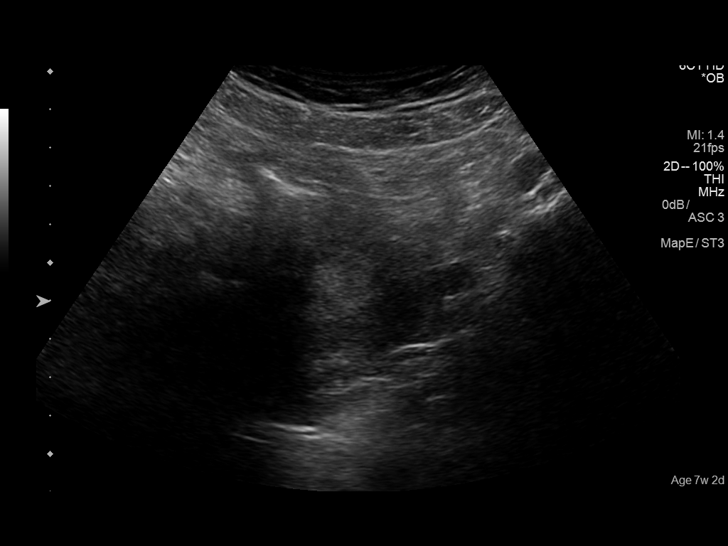
[im 26/141]
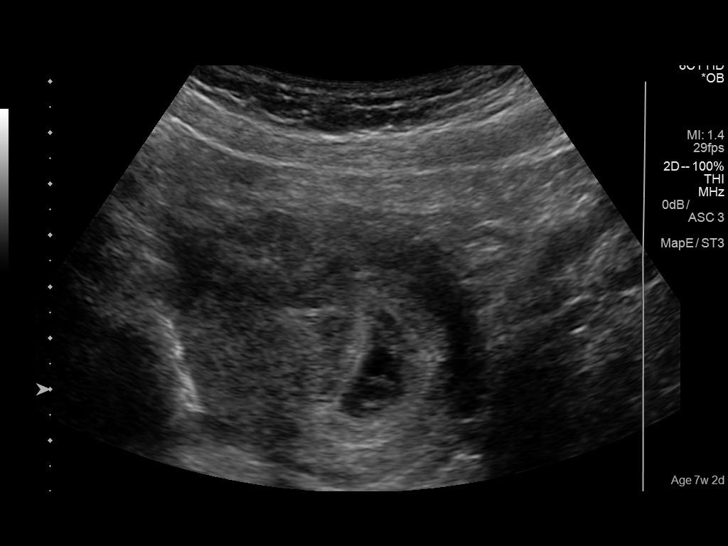
[im 37/141]
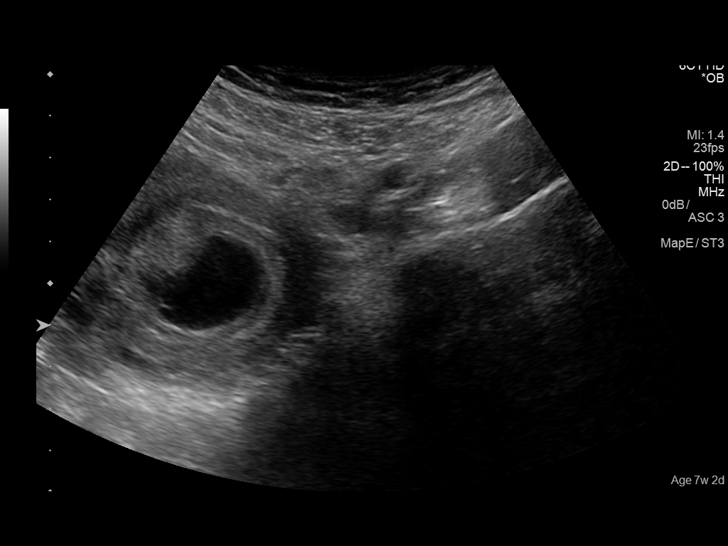
[im 47/141]
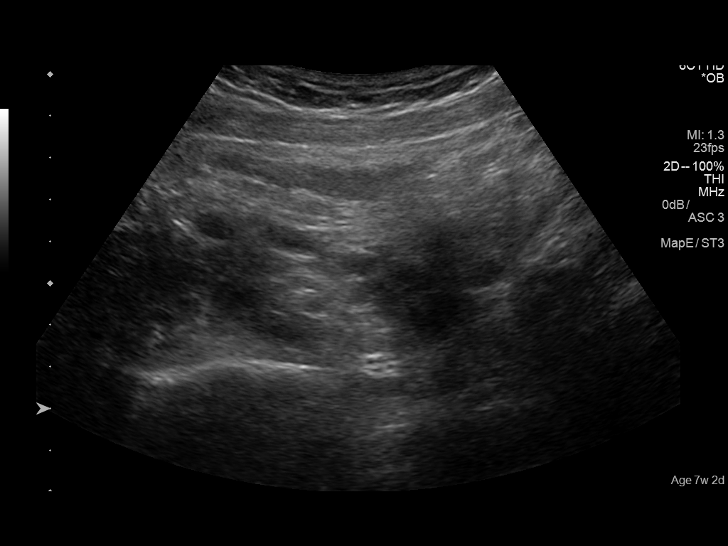
[im 58/141]
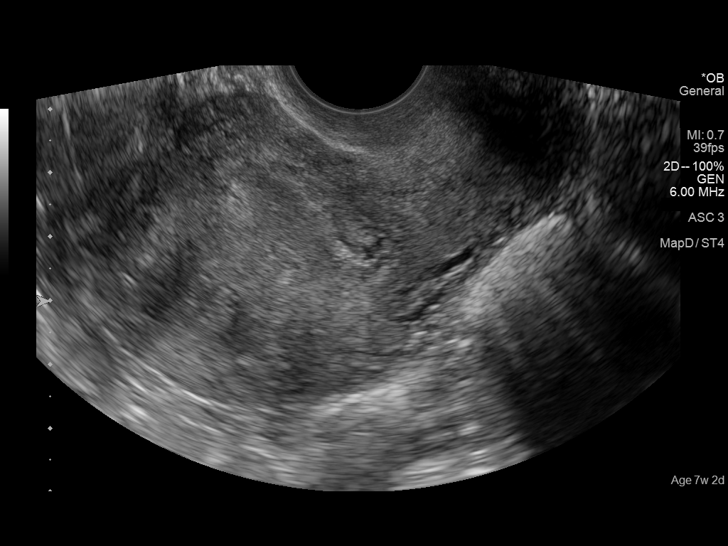
[im 68/141]
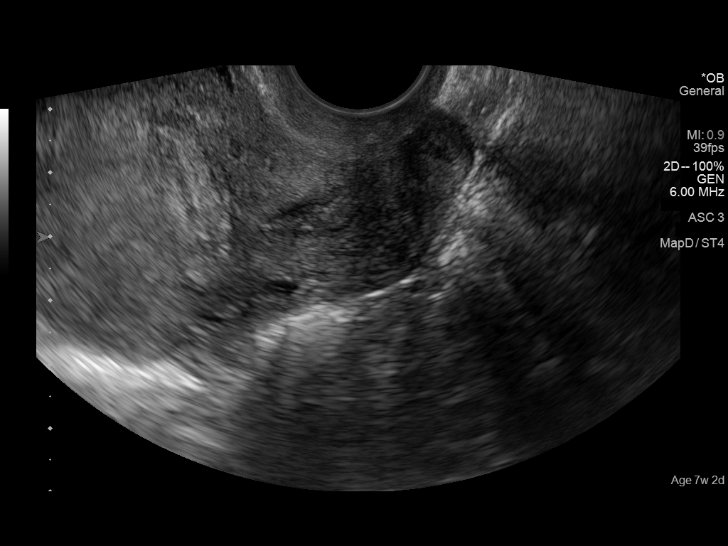
[im 78/141]
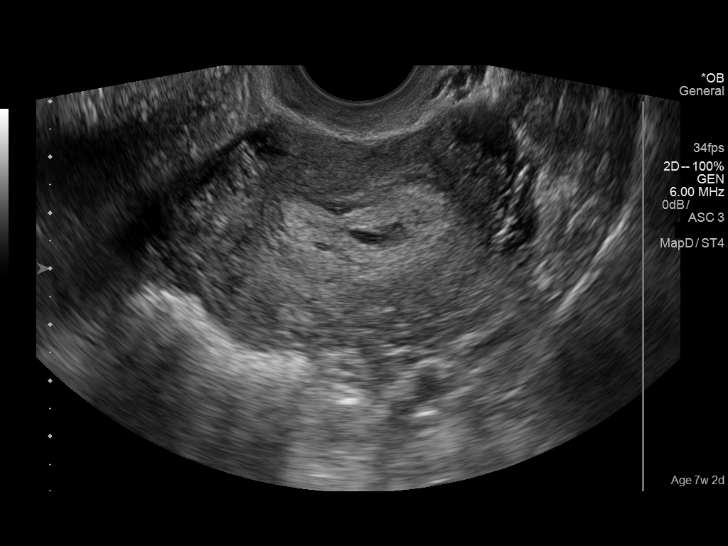
[im 89/141]
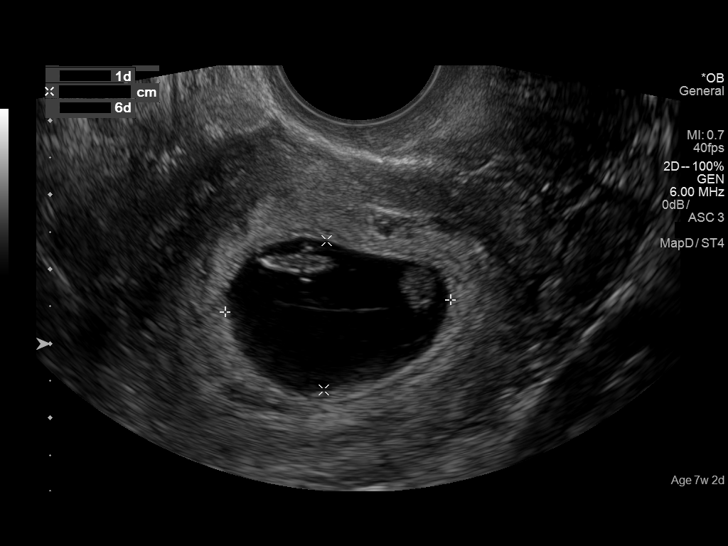
[im 99/141]
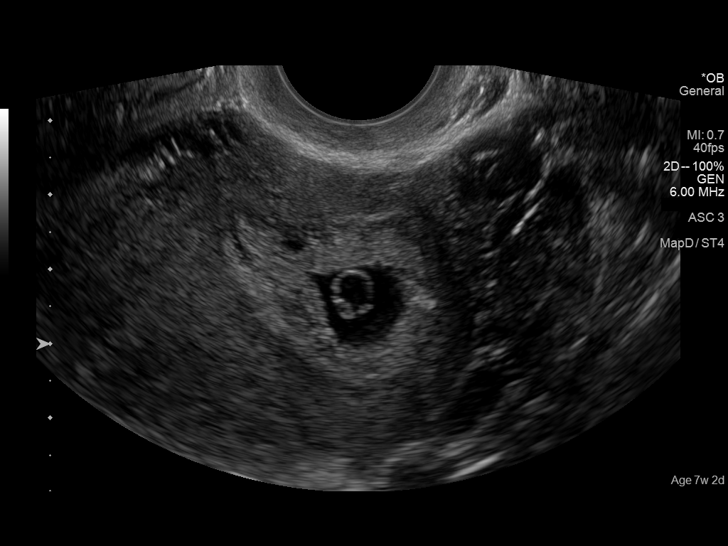
[im 109/141]
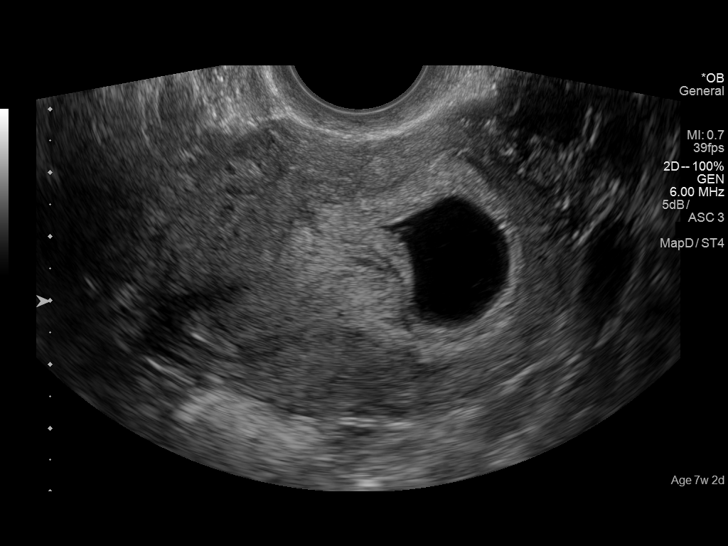
[im 120/141]
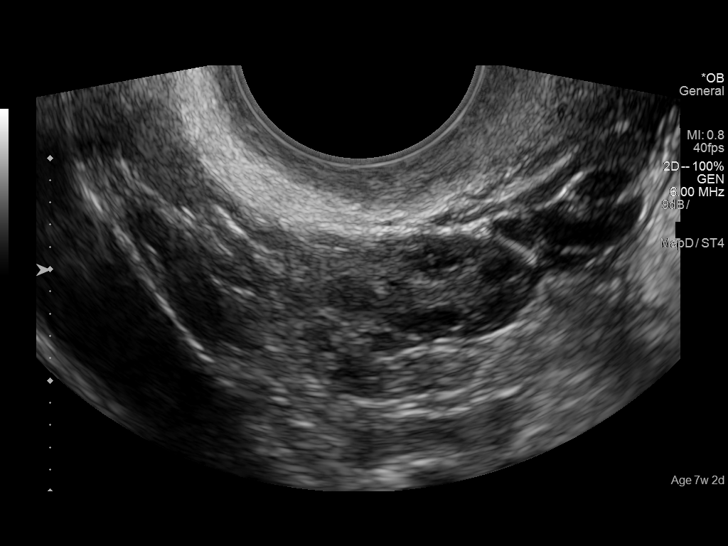
[im 130/141]
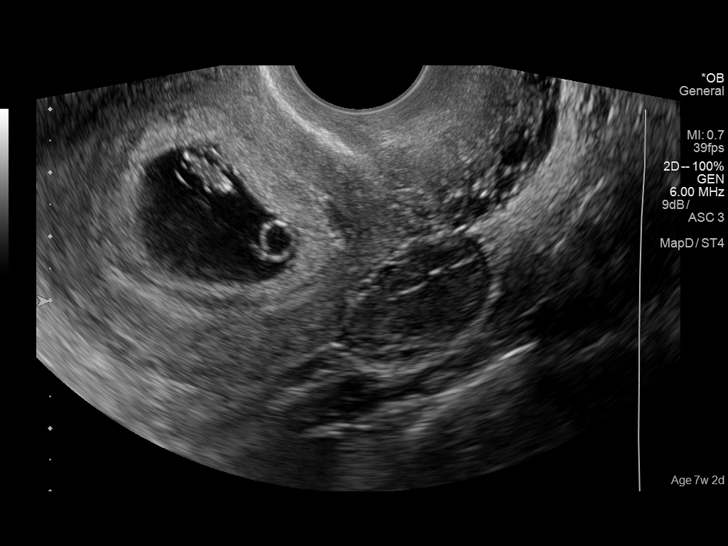
[im 141/141]
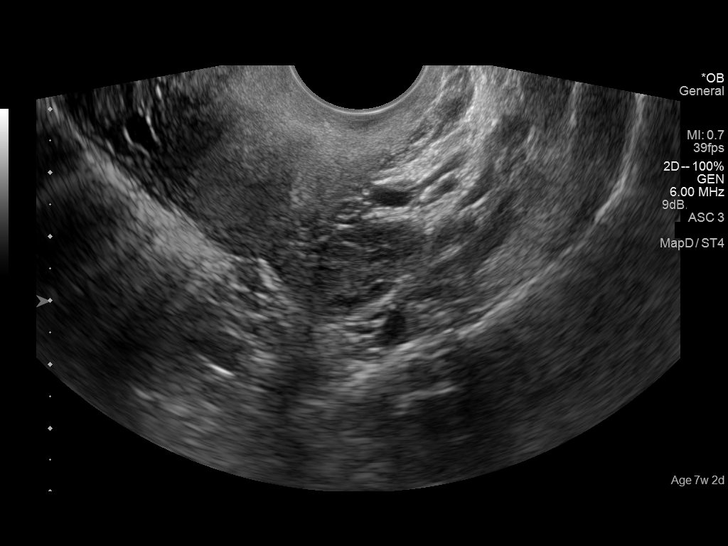

[14 of 28 positions shown; findings below may reference images not displayed]

FINDINGS: Intrauterine gestational sac: Single

Yolk sac:  Visualized.

Embryo:  Visualized.

Cardiac Activity: Visualized.

Heart Rate: 131  bpm

CRL:  11  mm   7 w   2 d                  US EDC: 12/04/2017

Subchorionic hemorrhage: Small to moderate heterogeneous
subchorionic collection, likely hemorrhage.

Maternal uterus/adnexae: Negative.
IMPRESSION: 1. Single live intrauterine pregnancy with estimated gestational age
of 7 weeks and 2 days.
2. Small to moderate subchorionic hemorrhage.

By: Lysette Lopex M.D.

## 2018-07-17 IMAGING — US US MFM OB FOLLOW-UP
1 series · 14 of 28 positions shown · non-contrast
Comparison: none

[Series 1: us mfm ob follow-up · 40 acquisitions, 14 frames shown]
[im 2/40]
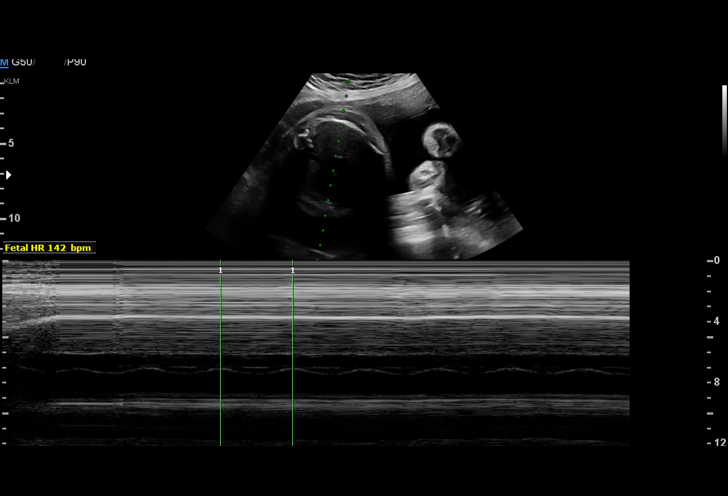
[im 5/40]
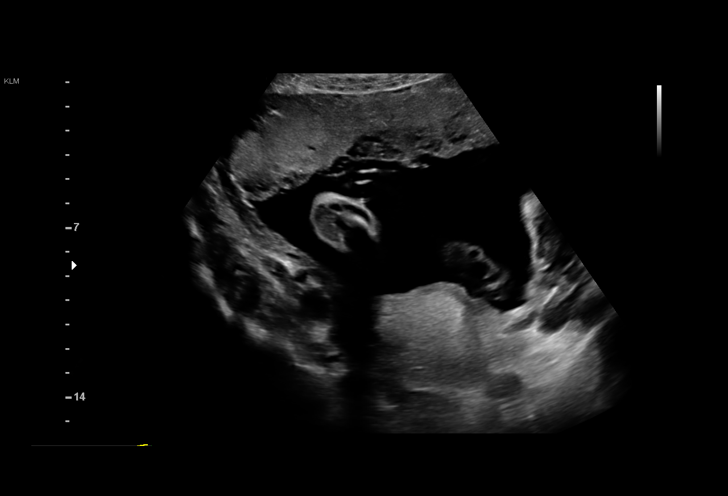
[im 8/40]
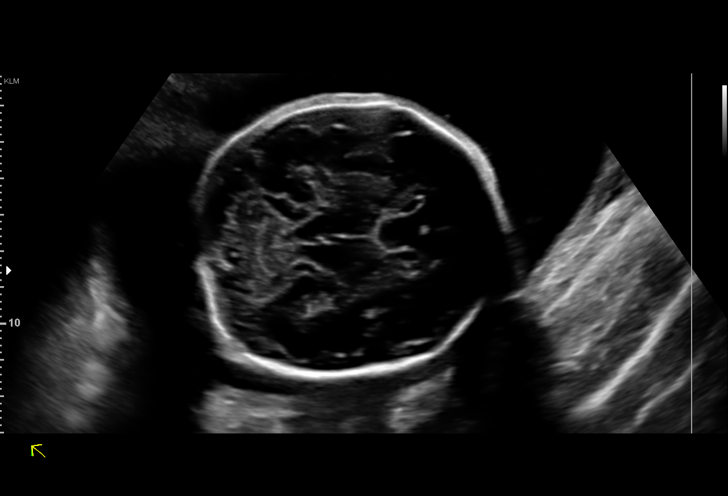
[im 11/40]
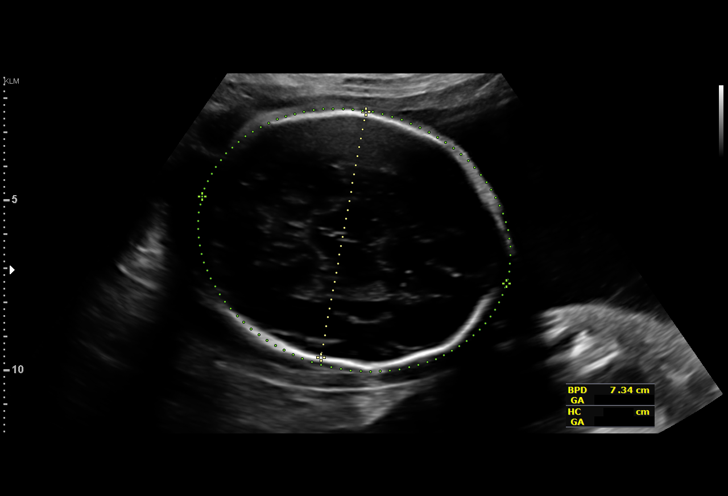
[im 14/40]
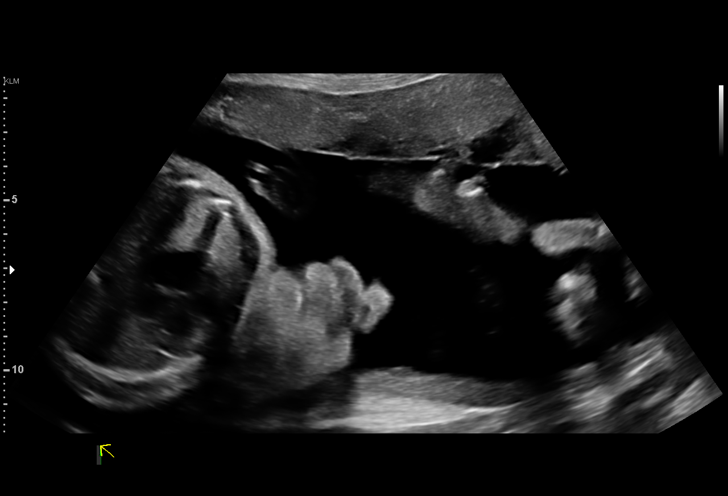
[im 16/40]
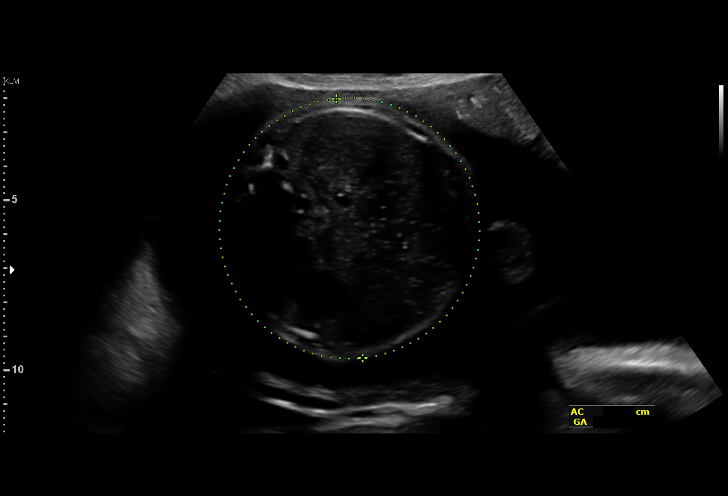
[im 19/40]
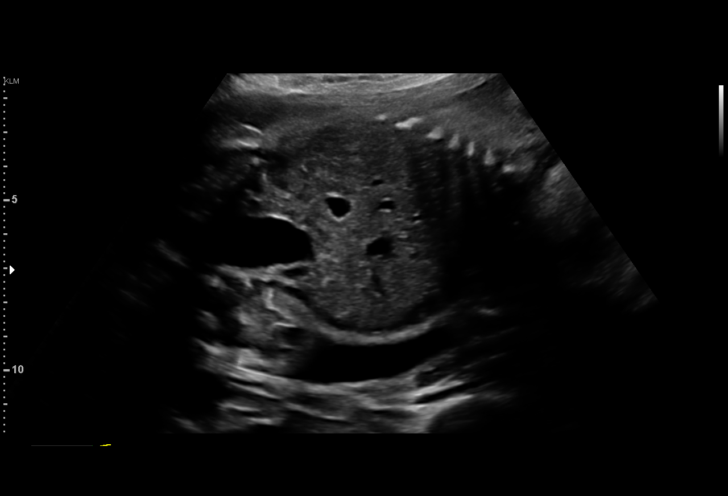
[im 22/40]
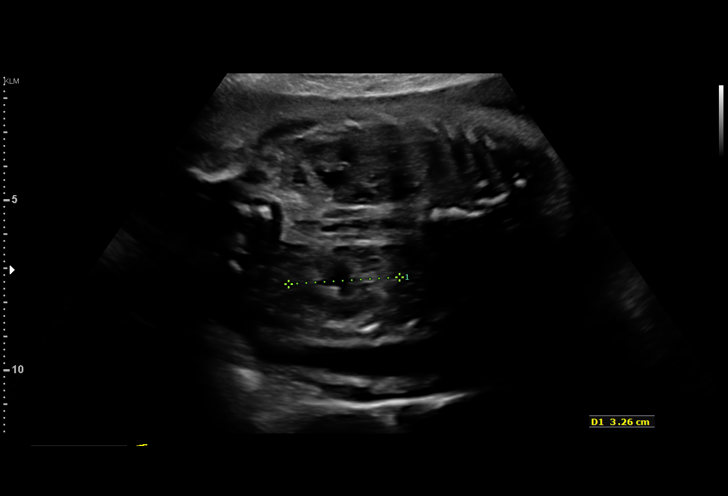
[im 25/40]
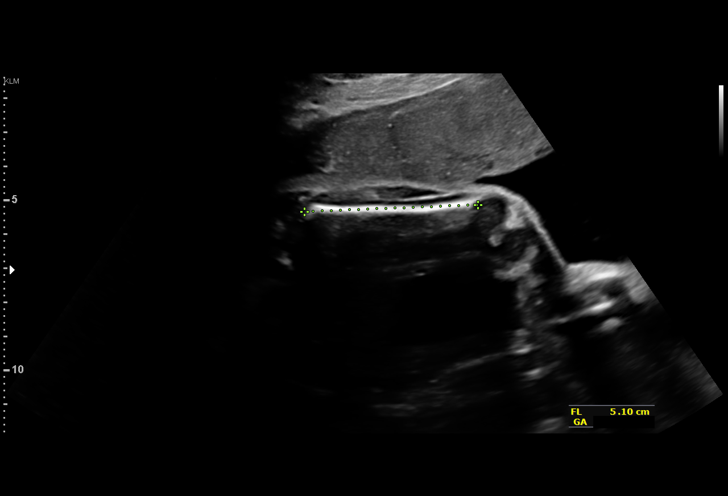
[im 28/40]
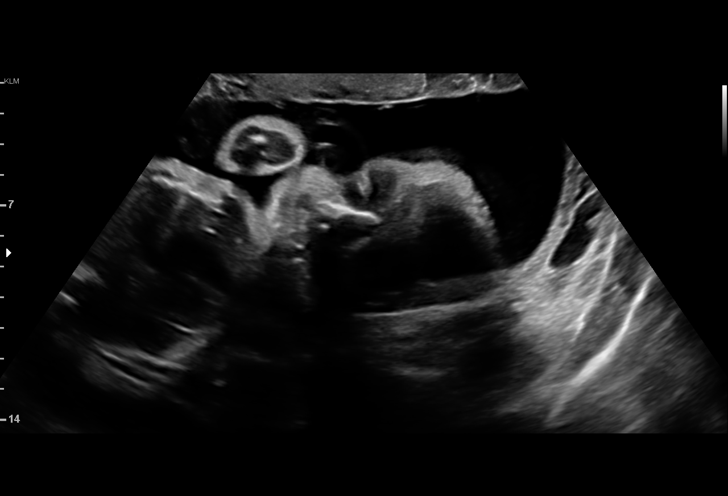
[im 31/40]
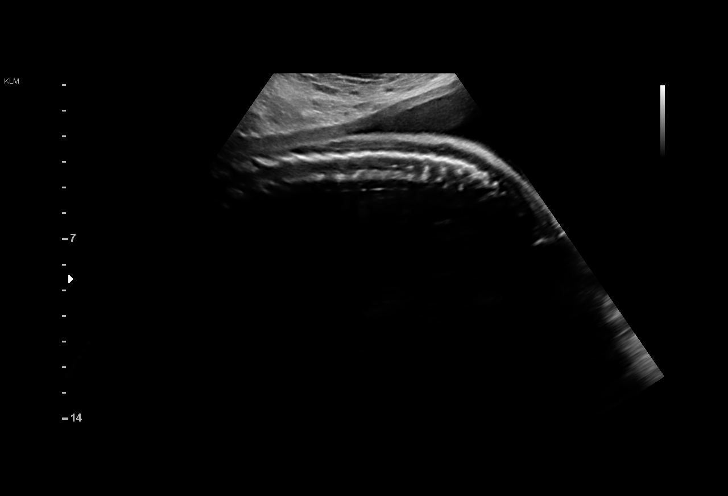
[im 34/40]
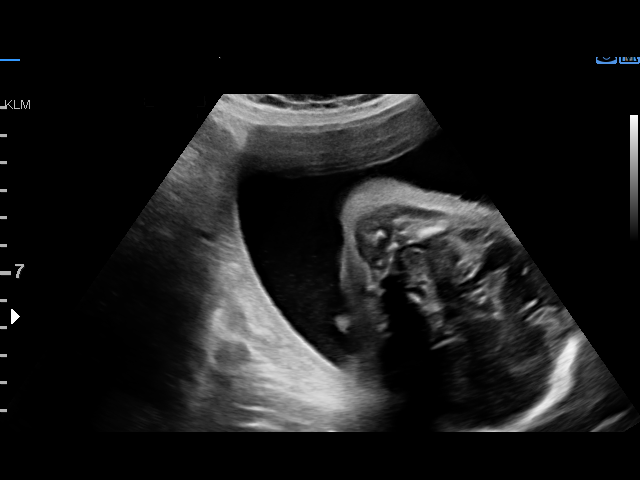
[im 37/40]
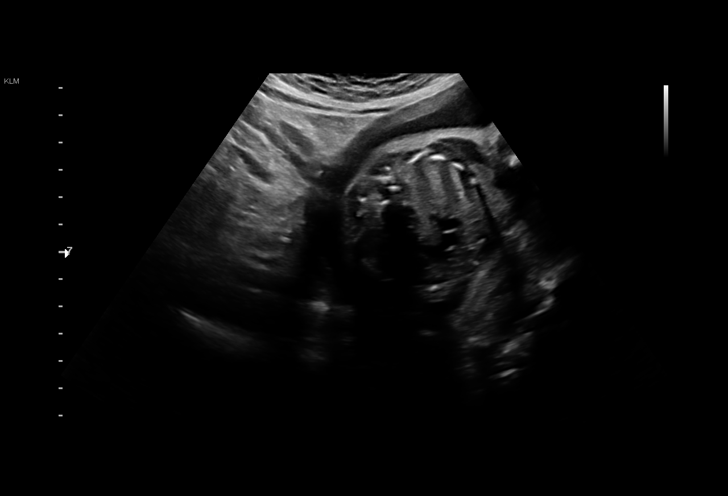
[im 40/40]
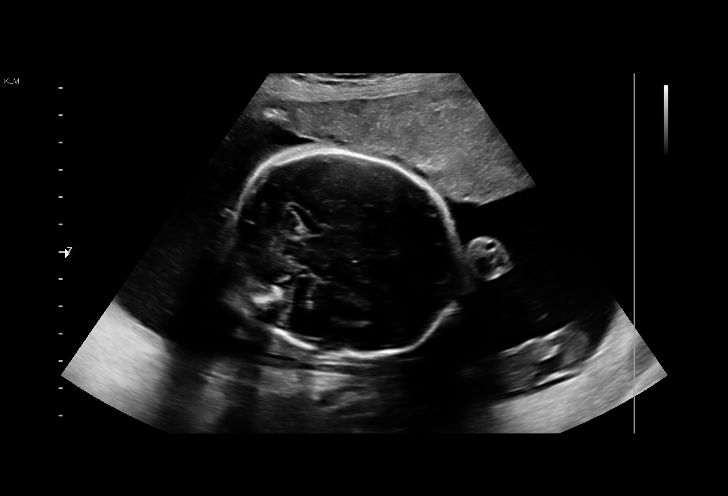

[14 of 28 positions shown; findings below may reference images not displayed]

[REDACTED]care -
[REDACTED]
Ref. Address:     Faculty

1  NOMASIBULELE MOATSHE            [id number]      [phone]     [phone]
Indications

28 weeks gestation of pregnancy
Cervical shortening, third trimester (vaginal
progesterone)
OB History

Blood Type:            Height:  5'3"   Weight (lb):  138       BMI:
Gravidity:    3         Term:   1        Prem:   0        SAB:   1
TOP:          0       Ectopic:  0        Living: 1
Fetal Evaluation

Num Of Fetuses:     1
Fetal Heart         142
Rate(bpm):
Cardiac Activity:   Observed
Presentation:       Transverse, head to maternal left
Placenta:           Anterior, above cervical os
P. Cord Insertion:  Visualized, central

Amniotic Fluid
AFI FV:      Subjectively within normal limits
Biometry

BPD:      73.3  mm     G. Age:  29w 3d         74  %    CI:        74.09   %    70 - 86
FL/HC:      18.8   %    18.8 -
HC:      270.4  mm     G. Age:  29w 3d         56  %    HC/AC:      1.11        1.05 -
AC:      243.2  mm     G. Age:  28w 4d         52  %    FL/BPD:     69.2   %    71 - 87
FL:       50.7  mm     G. Age:  27w 1d         10  %    FL/AC:      20.8   %    20 - 24

Est. FW:    6633  gm    2 lb 10 oz      50  %
Gestational Age

LMP:           28w 2d        Date:  02/27/17                 EDD:   12/04/17
U/S Today:     28w 5d                                        EDD:   12/01/17
Best:          28w 2d     Det. By:  LMP  (02/27/17)          EDD:   12/04/17
Anatomy

Cranium:               Appears normal         Aortic Arch:            Previously seen
Cavum:                 Appears normal         Ductal Arch:            Previously seen
Ventricles:            Appears normal         Diaphragm:              Appears normal
Choroid Plexus:        Appears normal         Stomach:                Appears normal, left
sided
Cerebellum:            Previously seen        Abdomen:                Appears normal
Posterior Fossa:       Previously seen        Abdominal Wall:         Previously seen
Nuchal Fold:           Previously seen        Cord Vessels:           Previously seen
Face:                  Orbits and profile     Kidneys:                Appear normal
previously seen
Lips:                  Appears normal         Bladder:                Appears normal
Thoracic:              Appears normal         Spine:                  Appears normal
Heart:                 Previously seen        Upper Extremities:      Previously seen
RVOT:                  Previously seen        Lower Extremities:      Previously seen
LVOT:                  Appears normal

Other:  Male gender. Heels and 5th digit previously visualized.
Cervix Uterus Adnexa

Cervix
Length:            2.4  cm.
Measured transabdominally. Stable from previous measurements.
Impression

Singleton intrauterine pregnancy at 28+2 weeks with history
of short cervix
Interval review of the anatomy shows no sonographic
markers for aneuploidy or structural anomalies
All relevant anatomy has been visualized
Amniotic fluid volume is normal
Estimated fetal weight is 1199g which is growth in the 50th
percentile
Cervix is 24mm in length transabdominally
Recommendations

No further US evaluations are necessary. TV cervical length
was no performed as the value of cervical length  as a
predictor of PTD degrades rapidly past 26 weeks

## 2018-07-18 ENCOUNTER — Encounter (HOSPITAL_COMMUNITY): Payer: Self-pay | Admitting: Emergency Medicine

## 2018-07-18 ENCOUNTER — Emergency Department (HOSPITAL_COMMUNITY)
Admission: EM | Admit: 2018-07-18 | Discharge: 2018-07-18 | Disposition: A | Discharge status: Home / Self Care | Payer: Medicaid Other | Attending: Emergency Medicine | Admitting: Emergency Medicine

## 2018-07-18 ENCOUNTER — Other Ambulatory Visit: Payer: Self-pay

## 2018-07-18 DIAGNOSIS — Z87891 Personal history of nicotine dependence: Secondary | ICD-10-CM | POA: Diagnosis not present

## 2018-07-18 DIAGNOSIS — Z79899 Other long term (current) drug therapy: Secondary | ICD-10-CM | POA: Insufficient documentation

## 2018-07-18 DIAGNOSIS — T498X5A Adverse effect of other topical agents, initial encounter: Secondary | ICD-10-CM | POA: Diagnosis not present

## 2018-07-18 DIAGNOSIS — R600 Localized edema: Secondary | ICD-10-CM | POA: Diagnosis not present

## 2018-07-18 DIAGNOSIS — T4995XA Adverse effect of unspecified topical agent, initial encounter: Secondary | ICD-10-CM

## 2018-07-18 DIAGNOSIS — L089 Local infection of the skin and subcutaneous tissue, unspecified: Secondary | ICD-10-CM

## 2018-07-18 NOTE — ED Triage Notes (Signed)
Patient c/o redness and burning to skin after using icy hot to right upper back.

## 2018-07-18 NOTE — ED Notes (Signed)
Pt reports that she had a rash that was hot and burning on her upper back after rubbing Icy hot on her sore muscles. Pt states that since she has been here the area has improved and currently some mild redness Pt reports 3/10 pain at this time.

## 2018-07-18 NOTE — ED Provider Notes (Signed)
[facility]-EMERGENCY DEPT Provider Note   CSN: [id number] Arrival date & time: 07/18/18  1539     History   Chief Complaint Chief Complaint  Patient presents with  . Back Pain    HPI Christine Orozco is a 32 y.o. female presents for evaluation of localized reaction.  Patient states that earlier she applied icy hot to her upper back where she had a muscle strain injury.  About 30 minutes after applying it got extremely hot extremely red and very painful.  She tried using water to remove it however it continued to burn her severely.  She showed me a picture on her phone that showed significant erythema and swelling.  She is never had a reaction to it like that in the past.  She states that it is mostly resolved at this point after waiting.  She denies fevers, chills, throat or oral swelling, hives.  HPI  Past Medical History:  Diagnosis Date  . Hepatitis B antibody positive   . Vaginal Pap smear, abnormal     There are no active problems to display for this patient.   Past Surgical History:  Procedure Laterality Date  . CERVICAL BIOPSY  W/ LOOP ELECTRODE EXCISION    . extraction of wisdom teeth       OB History    Gravida  3   Para  2   Term  2   Preterm      AB  1   Living  2     SAB  1   TAB      Ectopic      Multiple  0   Live Births  2            Home Medications    Prior to Admission medications   Medication Sig Start Date End Date Taking? Authorizing Provider  benzonatate (TESSALON) 100 MG capsule Take 1 capsule (100 mg total) by mouth every 8 (eight) hours. 05/25/18   Maczis, Elmer Sow, PA-C  cyclobenzaprine (FLEXERIL) 5 MG tablet Take 1 tablet (5 mg total) by mouth 3 (three) times daily as needed for muscle spasms. Patient not taking: Reported on 05/25/2018 03/29/18   Charlynne Pander, MD  ibuprofen (ADVIL,MOTRIN) 800 MG tablet Take 1 tablet (800 mg total) by mouth 3 (three) times daily. 03/29/18   Charlynne Pander,  MD  medroxyPROGESTERone (DEPO-PROVERA) 150 MG/ML injection Inject 1 mL (150 mg total) into the muscle every 3 (three) months. 12/21/17   Anyanwu, Jethro Bastos, MD  Multiple Vitamin (MULTIVITAMIN WITH MINERALS) TABS tablet Take 1 tablet by mouth daily.    [provider]  Prenatal MV-Min-FA-Omega-3 (PRENATAL GUMMIES/DHA & FA) 0.4-32.5 MG CHEW Chew 2 capsules by mouth daily.     [provider]  pseudoephedrine-guaifenesin (MUCINEX D) 60-600 MG 12 hr tablet Take 1 tablet by mouth every 12 (twelve) hours.    [provider]    Family History Family History  Adopted: Yes    Social History Social History   Tobacco Use  . Smoking status: Former Smoker    Packs/day: 0.25  . Smokeless tobacco: Never Used  Substance Use Topics  . Alcohol use: No    Frequency: Never  . Drug use: No     Allergies   Beef-derived products; Ultram [tramadol]; Allegra [fexofenadine]; Augmentin [amoxicillin-pot clavulanate]; Benadryl [diphenhydramine]; Naldecon senior [guaifenesin]; Sudafed [pseudoephedrine]; and Zyrtec [cetirizine]   Review of Systems Review of Systems Ten systems reviewed and are negative for acute change,  except as noted in the HPI.    Physical Exam Updated Vital Signs BP (!) 141/101   Pulse 79   Temp 98.2 F (36.8 C) (Oral)   Resp 18   SpO2 95%   Physical Exam Physical Exam  Nursing note and vitals reviewed. Constitutional: She is oriented to person, place, and time. She appears well-developed and well-nourished. No distress.  HENT:  Head: Normocephalic and atraumatic.  Eyes: Conjunctivae normal and EOM are normal. Pupils are equal, round, and reactive to light. No scleral icterus.  Neck: Normal range of motion.  Cardiovascular: Normal rate, regular rhythm and normal heart sounds.  Exam reveals no gallop and no friction rub.   No murmur heard. Pulmonary/Chest: Effort normal and breath sounds normal. No respiratory distress.  Abdominal: Soft. Bowel  sounds are normal. She exhibits no distension and no mass. There is no tenderness. There is no guarding.  Neurological: She is alert and oriented to person, place, and time.  Skin: Skin is warm and dry. She is not diaphoretic. Mild tenderness over the right scapular region.  Erythema is resolved here.  No swelling, no hives.    ED Treatments / Results  Labs (all labs ordered are listed, but only abnormal results are displayed) Labs Reviewed - No data to display  EKG None  Radiology No results found.  Procedures Procedures (including critical care time)  Medications Ordered in ED Medications - No data to display   Initial Impression / Assessment and Plan / ED Course  I have reviewed the triage vital signs and the nursing notes.  Pertinent labs & imaging results that were available during my care of the patient were reviewed by me and considered in my medical decision making (see chart for details).     Vision with localized reaction to topical medication that self resolved and her bleeding..  Advised patient to discontinue using that medication.  She may use ice and ibuprofen or Tylenol for her pain.  Follow-up with primary care physician.  Return for symptoms of systemic reaction such as wheezing, difficulty breathing or hives.  She has no evidence of severe reaction such as Stevens-Johnson syndrome.  Skin is intact without Nikolsky sign.  Appears appropriate for discharge at this time  Final Clinical Impressions(s) / ED Diagnoses   Final diagnoses:  None    ED Discharge Orders    None       Arthor Captain, PA-C 07/18/18 1733    Long, Arlyss Repress, MD 07/19/18 [phone]

## 2018-07-18 NOTE — Discharge Instructions (Signed)
Get help right away if: You start to have breathing problems. You start to have shortness of breath. You face or throat starts to swell. You have severe weakness with dizziness or fainting. You have chest pain

## 2018-07-26 ENCOUNTER — Encounter: Payer: Self-pay | Admitting: *Deleted

## 2018-08-08 ENCOUNTER — Other Ambulatory Visit: Payer: Self-pay

## 2018-08-08 ENCOUNTER — Emergency Department (HOSPITAL_COMMUNITY)
Admission: EM | Admit: 2018-08-08 | Discharge: 2018-08-08 | Discharge status: Against Medical Advice | Payer: Medicaid Other | Attending: Emergency Medicine | Admitting: Emergency Medicine

## 2018-08-08 ENCOUNTER — Encounter (HOSPITAL_COMMUNITY): Payer: Self-pay

## 2018-08-08 DIAGNOSIS — R1031 Right lower quadrant pain: Secondary | ICD-10-CM | POA: Diagnosis present

## 2018-08-08 DIAGNOSIS — Z5321 Procedure and treatment not carried out due to patient leaving prior to being seen by health care provider: Secondary | ICD-10-CM | POA: Insufficient documentation

## 2018-08-08 LAB — POC URINE PREG, ED: Preg Test, Ur: NEGATIVE

## 2018-08-08 LAB — URINALYSIS, ROUTINE W REFLEX MICROSCOPIC
Bilirubin Urine: NEGATIVE
GLUCOSE, UA: NEGATIVE mg/dL
HGB URINE DIPSTICK: NEGATIVE
Ketones, ur: NEGATIVE mg/dL
LEUKOCYTES UA: NEGATIVE
Nitrite: NEGATIVE
PH: 7 (ref 5.0–8.0)
PROTEIN: NEGATIVE mg/dL
Specific Gravity, Urine: 1.02 (ref 1.005–1.030)

## 2018-08-08 NOTE — ED Notes (Signed)
Patient did not come when called for rooming x2.

## 2018-08-08 NOTE — ED Notes (Addendum)
Patient did not come when called for rooming x1.

## 2018-08-08 NOTE — ED Triage Notes (Signed)
Pt states abd pain in her RLQ pain to flank . Pt states that she has not urinated as much as normal either.

## 2018-08-08 NOTE — ED Notes (Signed)
Patient did not come when called for rooming x3.

## 2018-08-10 ENCOUNTER — Emergency Department (HOSPITAL_COMMUNITY)
Admission: EM | Admit: 2018-08-10 | Discharge: 2018-08-10 | Disposition: A | Discharge status: Home / Self Care | Payer: Medicaid Other | Attending: Emergency Medicine | Admitting: Emergency Medicine

## 2018-08-10 ENCOUNTER — Emergency Department (HOSPITAL_COMMUNITY): Payer: Medicaid Other

## 2018-08-10 ENCOUNTER — Encounter (HOSPITAL_COMMUNITY): Payer: Self-pay | Admitting: Emergency Medicine

## 2018-08-10 DIAGNOSIS — R197 Diarrhea, unspecified: Secondary | ICD-10-CM | POA: Diagnosis not present

## 2018-08-10 DIAGNOSIS — F17210 Nicotine dependence, cigarettes, uncomplicated: Secondary | ICD-10-CM | POA: Diagnosis not present

## 2018-08-10 DIAGNOSIS — R112 Nausea with vomiting, unspecified: Secondary | ICD-10-CM | POA: Insufficient documentation

## 2018-08-10 DIAGNOSIS — Z79899 Other long term (current) drug therapy: Secondary | ICD-10-CM | POA: Insufficient documentation

## 2018-08-10 LAB — CBC WITH DIFFERENTIAL/PLATELET
ABS IMMATURE GRANULOCYTES: 0.05 10*3/uL (ref 0.00–0.07)
BASOS ABS: 0.1 10*3/uL (ref 0.0–0.1)
Basophils Relative: 1 %
Eosinophils Absolute: 0.2 10*3/uL (ref 0.0–0.5)
Eosinophils Relative: 2 %
HEMATOCRIT: 45.1 % (ref 36.0–46.0)
Hemoglobin: 14.3 g/dL (ref 12.0–15.0)
IMMATURE GRANULOCYTES: 1 %
LYMPHS ABS: 2.3 10*3/uL (ref 0.7–4.0)
LYMPHS PCT: 32 %
MCH: 28.4 pg (ref 26.0–34.0)
MCHC: 31.7 g/dL (ref 30.0–36.0)
MCV: 89.7 fL (ref 80.0–100.0)
MONOS PCT: 6 %
Monocytes Absolute: 0.4 10*3/uL (ref 0.1–1.0)
NEUTROS ABS: 4.3 10*3/uL (ref 1.7–7.7)
NEUTROS PCT: 58 %
Platelets: 288 10*3/uL (ref 150–400)
RBC: 5.03 MIL/uL (ref 3.87–5.11)
RDW: 13.2 % (ref 11.5–15.5)
WBC: 7.3 10*3/uL (ref 4.0–10.5)
nRBC: 0 % (ref 0.0–0.2)

## 2018-08-10 LAB — COMPREHENSIVE METABOLIC PANEL
ALBUMIN: 4.3 g/dL (ref 3.5–5.0)
ALT: 15 U/L (ref 0–44)
AST: 17 U/L (ref 15–41)
Alkaline Phosphatase: 108 U/L (ref 38–126)
Anion gap: 9 (ref 5–15)
BUN: 16 mg/dL (ref 6–20)
CHLORIDE: 107 mmol/L (ref 98–111)
CO2: 24 mmol/L (ref 22–32)
CREATININE: 0.96 mg/dL (ref 0.44–1.00)
Calcium: 8.7 mg/dL — ABNORMAL LOW (ref 8.9–10.3)
GFR calc Af Amer: >60 mL/min (ref >60)
GLUCOSE: 102 mg/dL — AB (ref 70–99)
Potassium: 3.5 mmol/L (ref 3.5–5.1)
Sodium: 140 mmol/L (ref 135–145)
Total Bilirubin: 0.4 mg/dL (ref 0.3–1.2)
Total Protein: 7.1 g/dL (ref 6.5–8.1)

## 2018-08-10 LAB — LIPASE, BLOOD: Lipase: 31 U/L (ref 11–51)

## 2018-08-10 LAB — URINALYSIS, ROUTINE W REFLEX MICROSCOPIC
Bilirubin Urine: NEGATIVE
GLUCOSE, UA: NEGATIVE mg/dL
HGB URINE DIPSTICK: NEGATIVE
Ketones, ur: NEGATIVE mg/dL
Leukocytes, UA: NEGATIVE
Nitrite: NEGATIVE
PH: 6 (ref 5.0–8.0)
Protein, ur: NEGATIVE mg/dL
SPECIFIC GRAVITY, URINE: 1.015 (ref 1.005–1.030)

## 2018-08-10 LAB — I-STAT BETA HCG BLOOD, ED (MC, WL, AP ONLY): I-stat hCG, quantitative: <5 m[IU]/mL (ref <5)

## 2018-08-10 MED ORDER — SODIUM CHLORIDE 0.9 % IV BOLUS
1000.0000 mL | Freq: Once | INTRAVENOUS | Status: AC
  Administered 2018-08-10: 1000 mL via INTRAVENOUS

## 2018-08-10 MED ORDER — ONDANSETRON 4 MG PO TBDP: 4.0000 mg | ORAL_TABLET | Freq: Three times a day (TID) | ORAL | 0 refills | Status: DC | PRN

## 2018-08-10 MED ORDER — FAMOTIDINE IN NACL 20-0.9 MG/50ML-% IV SOLN
20.0000 mg | Freq: Once | INTRAVENOUS | Status: AC
  Administered 2018-08-10: 20 mg via INTRAVENOUS
  Filled 2018-08-10: qty 50

## 2018-08-10 MED ORDER — ONDANSETRON HCL 4 MG/2ML IJ SOLN: 4.0000 mg | INTRAMUSCULAR | Status: DC | PRN

## 2018-08-10 MED ORDER — DICYCLOMINE HCL 10 MG/ML IM SOLN
20.0000 mg | Freq: Once | INTRAMUSCULAR | Status: AC
  Administered 2018-08-10: 20 mg via INTRAMUSCULAR
  Filled 2018-08-10: qty 2

## 2018-08-10 NOTE — ED Notes (Signed)
Patient given crackers and water

## 2018-08-10 NOTE — ED Triage Notes (Signed)
Patient here from home with complaints of nausea, vomiting, diarrhea x2 days.  

## 2018-08-10 NOTE — ED Provider Notes (Signed)
[facility]-EMERGENCY DEPT Provider Note   CSN: [id number] Arrival date & time: 08/10/18  0708     History   Chief Complaint Chief Complaint  Patient presents with  . Abdominal Pain  . Nausea  . Emesis  . Diarrhea    HPI Christine Orozco is a 32 y.o. female.  HPI  Pt was seen at 0740. Per pt, c/o gradual onset and slow improvement of multiple intermittent episodes of N/V/D for the past 2 days.   Describes the stools as "loose." Denies abd pain, no CP/SOB, no back pain, no fevers, no black or blood in stools or emesis.     Past Medical History:  Diagnosis Date  . Hepatitis B antibody positive   . Vaginal Pap smear, abnormal     There are no active problems to display for this patient.   Past Surgical History:  Procedure Laterality Date  . CERVICAL BIOPSY  W/ LOOP ELECTRODE EXCISION    . extraction of wisdom teeth       OB History    Gravida  3   Para  2   Term  2   Preterm      AB  1   Living  2     SAB  1   TAB      Ectopic      Multiple  0   Live Births  2            Home Medications    Prior to Admission medications   Medication Sig Start Date End Date Taking? Authorizing Provider  benzonatate (TESSALON) 100 MG capsule Take 1 capsule (100 mg total) by mouth every 8 (eight) hours. 05/25/18   Maczis, Elmer Sow, PA-C  cyclobenzaprine (FLEXERIL) 5 MG tablet Take 1 tablet (5 mg total) by mouth 3 (three) times daily as needed for muscle spasms. Patient not taking: Reported on 05/25/2018 03/29/18   Charlynne Pander, MD  ibuprofen (ADVIL,MOTRIN) 800 MG tablet Take 1 tablet (800 mg total) by mouth 3 (three) times daily. 03/29/18   Charlynne Pander, MD  medroxyPROGESTERone (DEPO-PROVERA) 150 MG/ML injection Inject 1 mL (150 mg total) into the muscle every 3 (three) months. 12/21/17   Anyanwu, Jethro Bastos, MD  Multiple Vitamin (MULTIVITAMIN WITH MINERALS) TABS tablet Take 1 tablet by mouth daily.    [provider]    Prenatal MV-Min-FA-Omega-3 (PRENATAL GUMMIES/DHA & FA) 0.4-32.5 MG CHEW Chew 2 capsules by mouth daily.     [provider]  pseudoephedrine-guaifenesin (MUCINEX D) 60-600 MG 12 hr tablet Take 1 tablet by mouth every 12 (twelve) hours.    [provider]    Family History Family History  Adopted: Yes    Social History Social History   Tobacco Use  . Smoking status: Current Some Day Smoker    Packs/day: 0.25  . Smokeless tobacco: Never Used  Substance Use Topics  . Alcohol use: Yes    Frequency: Never    Comment: socially  . Drug use: No     Allergies   Beef-derived products; Ultram [tramadol]; Allegra [fexofenadine]; Augmentin [amoxicillin-pot clavulanate]; Benadryl [diphenhydramine]; Naldecon senior [guaifenesin]; Sudafed [pseudoephedrine]; and Zyrtec [cetirizine]   Review of Systems Review of Systems ROS: Statement: All systems negative except as marked or noted in the HPI; Constitutional: Negative for fever and chills. ; ; Eyes: Negative for eye pain, redness and discharge. ; ; ENMT: Negative for ear pain, hoarseness, nasal congestion, sinus pressure and sore throat. ; ; Cardiovascular:  Negative for chest pain, palpitations, diaphoresis, dyspnea and peripheral edema. ; ; Respiratory: Negative for cough, wheezing and stridor. ; ; Gastrointestinal: +N/V/D. Negative for abdominal pain, blood in stool, hematemesis, jaundice and rectal bleeding. . ; ; Genitourinary: Negative for dysuria, flank pain and hematuria. ; ; Musculoskeletal: Negative for back pain and neck pain. Negative for swelling and trauma.; ; Skin: Negative for pruritus, rash, abrasions, blisters, bruising and skin lesion.; ; Neuro: Negative for headache, lightheadedness and neck stiffness. Negative for weakness, altered level of consciousness, altered mental status, extremity weakness, paresthesias, involuntary movement, seizure and syncope.       Physical Exam Updated Vital Signs BP 128/80 (BP  Location: Left Arm)   Pulse 83   Temp 98.8 F (37.1 C) (Oral)   Resp 18   SpO2 100%   Physical Exam 0745: Physical examination:  Nursing notes reviewed; Vital signs and O2 SAT reviewed;  Constitutional: Well developed, Well nourished, Well hydrated, In no acute distress; Head:  Normocephalic, atraumatic; Eyes: EOMI, PERRL, No scleral icterus; ENMT: Mouth and pharynx normal, Mucous membranes moist; Neck: Supple, Full range of motion, No lymphadenopathy; Cardiovascular: Regular rate and rhythm, No gallop; Respiratory: Breath sounds clear & equal bilaterally, No wheezes.  Speaking full sentences with ease, Normal respiratory effort/excursion; Chest: Nontender, Movement normal; Abdomen: Soft, Nontender, Nondistended, Normal bowel sounds; Genitourinary: No CVA tenderness; Extremities: Peripheral pulses normal, No tenderness, No edema, No calf edema or asymmetry.; Neuro: AA&Ox3, Major CN grossly intact.  Speech clear. No gross focal motor or sensory deficits in extremities.; Skin: Color normal, Warm, Dry.   ED Treatments / Results  Labs (all labs ordered are listed, but only abnormal results are displayed)   EKG None  Radiology   Procedures Procedures (including critical care time)  Medications Ordered in ED Medications  sodium chloride 0.9 % bolus 1,000 mL (has no administration in time range)  ondansetron (ZOFRAN) injection 4 mg (has no administration in time range)  famotidine (PEPCID) IVPB 20 mg premix (has no administration in time range)  dicyclomine (BENTYL) injection 20 mg (has no administration in time range)     Initial Impression / Assessment and Plan / ED Course  I have reviewed the triage vital signs and the nursing notes.  Pertinent labs & imaging results that were available during my care of the patient were reviewed by me and considered in my medical decision making (see chart for details).  MDM Reviewed: previous chart, nursing note and vitals Reviewed previous:  labs Interpretation: labs and x-ray   Results for orders placed or performed during the hospital encounter of 08/10/18  Lipase, blood  Result Value Ref Range   Lipase 31 11 - 51 U/L  Comprehensive metabolic panel  Result Value Ref Range   Sodium 140 135 - 145 mmol/L   Potassium 3.5 3.5 - 5.1 mmol/L   Chloride 107 98 - 111 mmol/L   CO2 24 22 - 32 mmol/L   Glucose, Bld 102 (H) 70 - 99 mg/dL   BUN 16 6 - 20 mg/dL   Creatinine, Ser 9.62 0.44 - 1.00 mg/dL   Calcium 8.7 (L) 8.9 - 10.3 mg/dL   Total Protein 7.1 6.5 - 8.1 g/dL   Albumin 4.3 3.5 - 5.0 g/dL   AST 17 15 - 41 U/L   ALT 15 0 - 44 U/L   Alkaline Phosphatase 108 38 - 126 U/L   Total Bilirubin 0.4 0.3 - 1.2 mg/dL   GFR calc non Af Amer >60 >60 mL/min  GFR calc Af Amer >60 >60 mL/min   Anion gap 9 5 - 15  Urinalysis, Routine w reflex microscopic  Result Value Ref Range   Color, Urine YELLOW YELLOW   APPearance CLEAR CLEAR   Specific Gravity, Urine 1.015 1.005 - 1.030   pH 6.0 5.0 - 8.0   Glucose, UA NEGATIVE NEGATIVE mg/dL   Hgb urine dipstick NEGATIVE NEGATIVE   Bilirubin Urine NEGATIVE NEGATIVE   Ketones, ur NEGATIVE NEGATIVE mg/dL   Protein, ur NEGATIVE NEGATIVE mg/dL   Nitrite NEGATIVE NEGATIVE   Leukocytes, UA NEGATIVE NEGATIVE  CBC with Differential  Result Value Ref Range   WBC 7.3 4.0 - 10.5 K/uL   RBC 5.03 3.87 - 5.11 MIL/uL   Hemoglobin 14.3 12.0 - 15.0 g/dL   HCT 40.9 81.1 - 91.4 %   MCV 89.7 80.0 - 100.0 fL   MCH 28.4 26.0 - 34.0 pg   MCHC 31.7 30.0 - 36.0 g/dL   RDW 78.2 95.6 - 21.3 %   Platelets 288 150 - 400 K/uL   nRBC 0.0 0.0 - 0.2 %   Neutrophils Relative % 58 %   Neutro Abs 4.3 1.7 - 7.7 K/uL   Lymphocytes Relative 32 %   Lymphs Abs 2.3 0.7 - 4.0 K/uL   Monocytes Relative 6 %   Monocytes Absolute 0.4 0.1 - 1.0 K/uL   Eosinophils Relative 2 %   Eosinophils Absolute 0.2 0.0 - 0.5 K/uL   Basophils Relative 1 %   Basophils Absolute 0.1 0.0 - 0.1 K/uL   Immature Granulocytes 1 %   Abs  Immature Granulocytes 0.05 0.00 - 0.07 K/uL  I-Stat beta hCG blood, ED  Result Value Ref Range   I-stat hCG, quantitative <5.0 <5 mIU/mL   Comment 3           Dg Abd Acute W/chest Result Date: 08/10/2018 CLINICAL DATA:  Nausea, vomiting, and diarrhea for the past 3 days. EXAM: DG ABDOMEN ACUTE W/ 1V CHEST COMPARISON:  Chest x-ray dated May 25, 2018. CT abdomen pelvis dated May 16, 2016. FINDINGS: The cardiomediastinal silhouette is normal in size. Normal pulmonary vascularity. No focal consolidation, pleural effusion, or pneumothorax. There is no evidence of dilated bowel loops or free intraperitoneal air. No radiopaque calculi or other significant radiographic abnormality is seen. No acute osseous abnormality. IMPRESSION: Negative abdominal radiographs.  No acute cardiopulmonary disease. Electronically Signed   By: Obie Dredge M.D.   On: 08/10/2018 09:07    1030:  Pt has tol PO well while in the ED without N/V.  No stooling while in the ED.  Abd remains benign, resps easy, VSS. Feels better and wants to go home now. Tx symptomatically at this time. Dx and testing d/w pt.  Questions answered.  Verb understanding, agreeable to d/c home with outpt f/u.     Final Clinical Impressions(s) / ED Diagnoses   Final diagnoses:  None    ED Discharge Orders    None       Samuel Jester, DO 08/12/18 1542

## 2018-08-10 NOTE — Discharge Instructions (Addendum)
Take the prescription as directed.  Increase your fluid intake (ie:  Gatoraide) for the next few days, as discussed.  Eat a bland diet and advance to your regular diet slowly as you can tolerate it.   Avoid full strength juices, as well as milk and milk products until your diarrhea has resolved.   Call your regular medical doctor today to schedule a follow up appointment next week.  Return to the Emergency Department immediately if not improving (or even worsening) despite taking the medicines as prescribed, any black or bloody stool or vomit, if you develop a fever over "101," or for any other concerns. ° °

## 2018-09-19 ENCOUNTER — Emergency Department (HOSPITAL_COMMUNITY)
Admission: EM | Admit: 2018-09-19 | Discharge: 2018-09-19 | Disposition: A | Discharge status: Home / Self Care | Payer: Medicaid Other | Attending: Emergency Medicine | Admitting: Emergency Medicine

## 2018-09-19 ENCOUNTER — Other Ambulatory Visit: Payer: Self-pay

## 2018-09-19 ENCOUNTER — Encounter (HOSPITAL_COMMUNITY): Payer: Self-pay

## 2018-09-19 ENCOUNTER — Emergency Department (HOSPITAL_COMMUNITY): Payer: Medicaid Other

## 2018-09-19 DIAGNOSIS — Z79899 Other long term (current) drug therapy: Secondary | ICD-10-CM | POA: Insufficient documentation

## 2018-09-19 DIAGNOSIS — J111 Influenza due to unidentified influenza virus with other respiratory manifestations: Secondary | ICD-10-CM | POA: Insufficient documentation

## 2018-09-19 DIAGNOSIS — F17200 Nicotine dependence, unspecified, uncomplicated: Secondary | ICD-10-CM | POA: Insufficient documentation

## 2018-09-19 DIAGNOSIS — R69 Illness, unspecified: Secondary | ICD-10-CM

## 2018-09-19 DIAGNOSIS — R509 Fever, unspecified: Secondary | ICD-10-CM | POA: Diagnosis present

## 2018-09-19 LAB — COMPREHENSIVE METABOLIC PANEL
ALBUMIN: 4.7 g/dL (ref 3.5–5.0)
ALK PHOS: 112 U/L (ref 38–126)
ALT: 16 U/L (ref 0–44)
AST: 19 U/L (ref 15–41)
Anion gap: 8 (ref 5–15)
BILIRUBIN TOTAL: 0.6 mg/dL (ref 0.3–1.2)
BUN: 12 mg/dL (ref 6–20)
CALCIUM: 9.3 mg/dL (ref 8.9–10.3)
CO2: 24 mmol/L (ref 22–32)
CREATININE: 0.86 mg/dL (ref 0.44–1.00)
Chloride: 107 mmol/L (ref 98–111)
GFR calc Af Amer: >60 mL/min (ref >60)
GFR calc non Af Amer: >60 mL/min (ref >60)
GLUCOSE: 127 mg/dL — AB (ref 70–99)
Potassium: 3.8 mmol/L (ref 3.5–5.1)
Sodium: 139 mmol/L (ref 135–145)
TOTAL PROTEIN: 7.3 g/dL (ref 6.5–8.1)

## 2018-09-19 LAB — CBC WITH DIFFERENTIAL/PLATELET
ABS IMMATURE GRANULOCYTES: 0.11 10*3/uL — AB (ref 0.00–0.07)
BASOS PCT: 1 %
Basophils Absolute: 0.1 10*3/uL (ref 0.0–0.1)
EOS ABS: 0.1 10*3/uL (ref 0.0–0.5)
Eosinophils Relative: 1 %
HCT: 46.2 % — ABNORMAL HIGH (ref 36.0–46.0)
Hemoglobin: 14.7 g/dL (ref 12.0–15.0)
Immature Granulocytes: 1 %
Lymphocytes Relative: 22 %
Lymphs Abs: 2.4 10*3/uL (ref 0.7–4.0)
MCH: 28.7 pg (ref 26.0–34.0)
MCHC: 31.8 g/dL (ref 30.0–36.0)
MCV: 90.1 fL (ref 80.0–100.0)
MONO ABS: 0.4 10*3/uL (ref 0.1–1.0)
MONOS PCT: 4 %
Neutro Abs: 7.5 10*3/uL (ref 1.7–7.7)
Neutrophils Relative %: 71 %
PLATELETS: 294 10*3/uL (ref 150–400)
RBC: 5.13 MIL/uL — ABNORMAL HIGH (ref 3.87–5.11)
RDW: 13.3 % (ref 11.5–15.5)
WBC: 10.6 10*3/uL — ABNORMAL HIGH (ref 4.0–10.5)
nRBC: 0 % (ref 0.0–0.2)

## 2018-09-19 LAB — I-STAT BETA HCG BLOOD, ED (MC, WL, AP ONLY): I-stat hCG, quantitative: <5 m[IU]/mL (ref <5)

## 2018-09-19 MED ORDER — ONDANSETRON 4 MG PO TBDP
4.0000 mg | ORAL_TABLET | Freq: Once | ORAL | Status: AC
  Administered 2018-09-19: 4 mg via ORAL
  Filled 2018-09-19: qty 1

## 2018-09-19 MED ORDER — ONDANSETRON HCL 4 MG PO TABS: 4.0000 mg | ORAL_TABLET | Freq: Four times a day (QID) | ORAL | 0 refills | Status: DC

## 2018-09-19 MED ORDER — KETOROLAC TROMETHAMINE 60 MG/2ML IM SOLN
60.0000 mg | Freq: Once | INTRAMUSCULAR | Status: AC
  Administered 2018-09-19: 60 mg via INTRAMUSCULAR
  Filled 2018-09-19: qty 2

## 2018-09-19 NOTE — ED Provider Notes (Signed)
[facility]-EMERGENCY DEPT Provider Note   CSN: [id number] Arrival date & time: 09/19/18  1544     History   Chief Complaint Chief Complaint  Patient presents with  . Generalized Body Aches  . Fever    HPI Christine Orozco is a 32 y.o. female past medical history of hepatitis B who presents for evaluation of 4 days of nasal congestion, rhinorrhea, cough, generalized body aches, fever, generalized abdominal pain and diarrhea.  Patient reports she has had a fever of up to 102.6.  She has been taking Tylenol and ibuprofen with fever improvement.  Her last dose was prior to coming to the ED.  She states she had a few episodes of vomiting last night and this morning.  No blood noted.  She has had some episodes of diarrhea.  Patient reports she has felt fatigued and had generalized body aches.  She reports yesterday, she did not want to do anything but rest as moving around made her feel tired.  She reports cough is been productive of phlegm.  She denies any difficulty breathing.  She has had some chest soreness associated with the cough but no chest pain at rest.  Patient does report that her son had similar symptoms prior to onset of her symptoms.  Patient reports she did not get a flu shot.  Patient denies any urinary complaints.  The history is provided by the patient.    Past Medical History:  Diagnosis Date  . Hepatitis B antibody positive   . Vaginal Pap smear, abnormal     There are no active problems to display for this patient.   Past Surgical History:  Procedure Laterality Date  . CERVICAL BIOPSY  W/ LOOP ELECTRODE EXCISION    . extraction of wisdom teeth       OB History    Gravida  3   Para  2   Term  2   Preterm      AB  1   Living  2     SAB  1   TAB      Ectopic      Multiple  0   Live Births  2            Home Medications    Prior to Admission medications   Medication Sig Start Date End Date Taking? Authorizing  Provider  benzonatate (TESSALON) 100 MG capsule Take 1 capsule (100 mg total) by mouth every 8 (eight) hours. Patient not taking: Reported on 08/10/2018 05/25/18   Maczis, Elmer SowMichael M, PA-C  cyclobenzaprine (FLEXERIL) 5 MG tablet Take 1 tablet (5 mg total) by mouth 3 (three) times daily as needed for muscle spasms. Patient not taking: Reported on 05/25/2018 03/29/18   Charlynne PanderYao, David Hsienta, MD  ibuprofen (ADVIL,MOTRIN) 200 MG tablet Take 400-600 mg by mouth every 6 (six) hours as needed for headache or mild pain.    [provider]  ibuprofen (ADVIL,MOTRIN) 800 MG tablet Take 1 tablet (800 mg total) by mouth 3 (three) times daily. Patient not taking: Reported on 08/10/2018 03/29/18   Charlynne PanderYao, David Hsienta, MD  medroxyPROGESTERone (DEPO-PROVERA) 150 MG/ML injection Inject 1 mL (150 mg total) into the muscle every 3 (three) months. 12/21/17   Anyanwu, Jethro BastosUgonna A, MD  Multiple Vitamin (MULTIVITAMIN WITH MINERALS) TABS tablet Take 1 tablet by mouth daily.    [provider]  ondansetron (ZOFRAN ODT) 4 MG disintegrating tablet Take 1 tablet (4 mg total) by mouth every 8 (eight)  hours as needed for nausea or vomiting. 08/10/18   Samuel Jester, DO  ondansetron (ZOFRAN) 4 MG tablet Take 1 tablet (4 mg total) by mouth every 6 (six) hours. 09/19/18   Maxwell Caul, PA-C    Family History Family History  Adopted: Yes    Social History Social History   Tobacco Use  . Smoking status: Current Some Day Smoker    Packs/day: 0.25  . Smokeless tobacco: Never Used  Substance Use Topics  . Alcohol use: Yes    Frequency: Never    Comment: socially  . Drug use: No     Allergies   Beef-derived products; Ultram [tramadol]; Allegra [fexofenadine]; Augmentin [amoxicillin-pot clavulanate]; Benadryl [diphenhydramine]; Naldecon senior [guaifenesin]; Sudafed [pseudoephedrine]; and Zyrtec [cetirizine]   Review of Systems Review of Systems  Constitutional: Positive for fever.  HENT: Positive for  congestion and rhinorrhea. Negative for sore throat.   Respiratory: Positive for cough. Negative for shortness of breath.   Cardiovascular: Negative for chest pain.  Gastrointestinal: Positive for abdominal pain, diarrhea, nausea and vomiting.  Genitourinary: Negative for dysuria and hematuria.  Musculoskeletal: Positive for myalgias.  Neurological: Negative for headaches.  All other systems reviewed and are negative.    Physical Exam Updated Vital Signs BP (!) 153/96 (BP Location: Left Arm)   Pulse 95   Temp 98.9 F (37.2 C) (Oral)   Resp 18   Ht 5\' 3"  (1.6 m)   Wt 76.2 kg   LMP 08/17/2018 (Within Weeks)   SpO2 97%   BMI 29.78 kg/m   Physical Exam Vitals signs and nursing note reviewed.  Constitutional:      Appearance: Normal appearance. She is well-developed.     Comments: Appears comfortable  HENT:     Head: Normocephalic and atraumatic.     Nose: Mucosal edema present.     Comments: Edematous and erythematous nasal turbinates bilaterally. Eyes:     General: Lids are normal.     Conjunctiva/sclera: Conjunctivae normal.     Pupils: Pupils are equal, round, and reactive to light.  Neck:     Musculoskeletal: Full passive range of motion without pain.  Cardiovascular:     Rate and Rhythm: Normal rate and regular rhythm.     Pulses: Normal pulses.          Radial pulses are 2+ on the right side and 2+ on the left side.       Dorsalis pedis pulses are 2+ on the right side and 2+ on the left side.     Heart sounds: Normal heart sounds. No murmur. No friction rub. No gallop.   Pulmonary:     Effort: Pulmonary effort is normal.     Breath sounds: Normal breath sounds.     Comments: Lungs clear to auscultation bilaterally.  Symmetric chest rise.  No wheezing, rales, rhonchi. Abdominal:     Palpations: Abdomen is soft. Abdomen is not rigid.     Tenderness: There is abdominal tenderness. There is no right CVA tenderness, left CVA tenderness or guarding. Negative signs  include McBurney's sign.     Comments: Abdomen is soft, nondistended.  Generalized tenderness no focal point.  No tenderness noted at McBurney's point.  No CVA tenderness bilaterally.  Musculoskeletal: Normal range of motion.  Skin:    General: Skin is warm and dry.     Capillary Refill: Capillary refill takes less than 2 seconds.  Neurological:     Mental Status: She is alert and oriented to person, place, and  time.  Psychiatric:        Speech: Speech normal.      ED Treatments / Results  Labs (all labs ordered are listed, but only abnormal results are displayed) Labs Reviewed  COMPREHENSIVE METABOLIC PANEL - Abnormal; Notable for the following components:      Result Value   Glucose, Bld 127 (*)    All other components within normal limits  CBC WITH DIFFERENTIAL/PLATELET - Abnormal; Notable for the following components:   WBC 10.6 (*)    RBC 5.13 (*)    HCT 46.2 (*)    Abs Immature Granulocytes 0.11 (*)    All other components within normal limits  I-STAT BETA HCG BLOOD, ED ([facility], [facility], [facility] ONLY)    EKG None  Radiology Dg Chest 2 View  Result Date: 09/19/2018 CLINICAL DATA:  Fever.  Flu-like symptoms.  Nausea be EXAM: CHEST - 2 VIEW COMPARISON:  08/10/2018 FINDINGS: The heart size and mediastinal contours are within normal limits. Both lungs are clear. The visualized skeletal structures are unremarkable. IMPRESSION: No active cardiopulmonary disease. Electronically Signed   By: Myles Rosenthal M.D.   On: 09/19/2018 18:09    Procedures Procedures (including critical care time)  Medications Ordered in ED Medications  ondansetron (ZOFRAN-ODT) disintegrating tablet 4 mg (4 mg Oral Given 09/19/18 1903)  ketorolac (TORADOL) injection 60 mg (60 mg Intramuscular Given 09/19/18 1904)     Initial Impression / Assessment and Plan / ED Course  I have reviewed the triage vital signs and the nursing notes.  Pertinent labs & imaging results that were available during my care of the  patient were reviewed by me and considered in my medical decision making (see chart for details).    32 year old female who presents for evaluation of 4 days of generalized body aches, fever, cough, nasal congestion, generalized abdominal pain, diarrhea.  Had few episodes of nonbloody, nonbilious vomiting last night when this morning.  She has been able to eat since then.  Reports son at home with similar symptoms.  She did not get a flu shot this year. Patient is afebrile, non-toxic appearing, sitting comfortably on examination table. Vital signs reviewed and stable.  On exam, patient with generalized abdominal tenderness with no focal point.  No tenderness noted at McBurney's point.  No CVA tenderness.  Consider influenza given consolation of symptoms.  History/physical exam not concerning for appendicitis as patient has no localized tenderness noted to the right lower quadrant and has diffuse tenderness noted throughout.  Given fever, will plan for chest x-ray for evaluation of pneumonia.  Initial labs ordered at triage.  I-STAT beta negative.  CMP is unremarkable.  CBC shows leukocytosis of 10.6.  Otherwise unremarkable.  Chest x-ray negative for any acute infectious etiology.    Reevaluation after analgesics.  Patient reports some improvement in aches.  She has been able to tolerate p.o. in the department any difficulty.  Vital signs are stable.  Repeat abdominal exam shows improved tenderness.  She does still has some generalized tenderness but with no focal point.  Specifically, she has no focalized tenderness noted to right lower quadrant at McBurney's point.  History/physical exam not concerning for appendicitis.  Patient states she feels like she is ready to go home.  Encourage at home supportive care measures. At this time, patient exhibits no emergent life-threatening condition that require further evaluation in ED. Patient had ample opportunity for questions and discussion. All patient's  questions were answered with full understanding. Strict return precautions discussed.  Patient expresses understanding and agreement to plan.   Final Clinical Impressions(s) / ED Diagnoses   Final diagnoses:  Influenza-like illness    ED Discharge Orders         Ordered    ondansetron (ZOFRAN) 4 MG tablet  Every 6 hours     09/19/18 1902           Rosana Hoes 09/20/18 6045    Pricilla Loveless, MD 09/22/18 [phone]

## 2018-09-19 NOTE — ED Notes (Signed)
Provided pt a coke for fluid/PO challenge

## 2018-09-19 NOTE — ED Triage Notes (Addendum)
Pt state she has had body ache, chills, slight fever, nausea since Saturday. Pt's son had similar symptoms. Pt states fever was up to 101.6. Pt took tylenol PTA.

## 2018-09-19 NOTE — Discharge Instructions (Signed)
You can take Tylenol or Ibuprofen as directed for pain. You can alternate Tylenol and Ibuprofen every 4 hours. If you take Tylenol at 1pm, then you can take Ibuprofen at 5pm. Then you can take Tylenol again at 9pm.   Take Zofran for nausea/vomiting.  As we discussed, you can try using over-the-counter Delsym for your cough.  Make sure you are staying hydrated drinking plenty of fluids.  Return the emergency department for any worsening abdominal pain, particularly if it localizes to the right lower quadrant, persistent vomiting, difficulty breathing or any other worsening or concerning symptoms.

## 2018-09-19 NOTE — ED Notes (Signed)
Patient transported to X-ray 

## 2018-12-05 ENCOUNTER — Other Ambulatory Visit: Payer: Self-pay

## 2018-12-05 ENCOUNTER — Emergency Department (HOSPITAL_COMMUNITY)
Admission: EM | Admit: 2018-12-05 | Discharge: 2018-12-05 | Disposition: A | Discharge status: Home / Self Care | Payer: Medicaid Other | Attending: Emergency Medicine | Admitting: Emergency Medicine

## 2018-12-05 ENCOUNTER — Emergency Department (HOSPITAL_COMMUNITY): Payer: Medicaid Other

## 2018-12-05 ENCOUNTER — Encounter (HOSPITAL_COMMUNITY): Payer: Self-pay | Admitting: *Deleted

## 2018-12-05 DIAGNOSIS — R05 Cough: Secondary | ICD-10-CM | POA: Insufficient documentation

## 2018-12-05 DIAGNOSIS — J111 Influenza due to unidentified influenza virus with other respiratory manifestations: Secondary | ICD-10-CM | POA: Diagnosis not present

## 2018-12-05 DIAGNOSIS — M791 Myalgia, unspecified site: Secondary | ICD-10-CM | POA: Diagnosis not present

## 2018-12-05 DIAGNOSIS — R0981 Nasal congestion: Secondary | ICD-10-CM | POA: Diagnosis present

## 2018-12-05 DIAGNOSIS — F17210 Nicotine dependence, cigarettes, uncomplicated: Secondary | ICD-10-CM | POA: Diagnosis not present

## 2018-12-05 DIAGNOSIS — R69 Illness, unspecified: Secondary | ICD-10-CM

## 2018-12-05 DIAGNOSIS — R509 Fever, unspecified: Secondary | ICD-10-CM | POA: Diagnosis not present

## 2018-12-05 MED ORDER — GUAIFENESIN 100 MG/5ML PO LIQD: 100.0000 mg | Freq: Three times a day (TID) | ORAL | 0 refills | Status: DC | PRN

## 2018-12-05 MED ORDER — NAPROXEN 500 MG PO TABS
500.0000 mg | ORAL_TABLET | Freq: Once | ORAL | Status: AC
  Administered 2018-12-05: 500 mg via ORAL
  Filled 2018-12-05: qty 1

## 2018-12-05 MED ORDER — ACETAMINOPHEN 325 MG PO TABS
650.0000 mg | ORAL_TABLET | Freq: Once | ORAL | Status: AC
  Administered 2018-12-05: 650 mg via ORAL
  Filled 2018-12-05: qty 2

## 2018-12-05 MED ORDER — BENZONATATE 100 MG PO CAPS: 100.0000 mg | ORAL_CAPSULE | Freq: Three times a day (TID) | ORAL | 0 refills | Status: DC

## 2018-12-05 MED ORDER — FLUTICASONE PROPIONATE 50 MCG/ACT NA SUSP: 1.0000 | Freq: Every day | NASAL | 0 refills | Status: DC

## 2018-12-05 MED ORDER — NAPROXEN 500 MG PO TABS: 500.0000 mg | ORAL_TABLET | Freq: Two times a day (BID) | ORAL | 0 refills | Status: DC

## 2018-12-05 NOTE — ED Triage Notes (Signed)
Pt reports generalized body aches with cough last night.  Started to have productive cough today with yellow sputum and fever of 101.

## 2018-12-05 NOTE — ED Provider Notes (Addendum)
[facility]-EMERGENCY DEPT Provider Note   CSN: [id number] Arrival date & time: 12/05/18  1542    History   Chief Complaint Chief Complaint  Patient presents with  . Generalized Body Aches  . Fever    HPI Christine Orozco is a 33 y.o. female who presents to the ED with complaints of flu like sxs that started this AM. Patient reports congestion, sinus pressure, cough productive of yellow phlegm sputum, generalized body aches, fever w/ temp max of 101, and chills. Sxs are constant. Fever alleviated w/ motrin taken at 12PM. No other alleviating/aggravating factors. Has had some post tussive emesis otherwise no vomiting. Denies chest pain, dyspnea, abdominal pain, melena, or hematochezia. Denies chance of pregnancy. She notes very infrequent tobacco use- only when drinking etoh occasionally.     HPI  Past Medical History:  Diagnosis Date  . Hepatitis B antibody positive   . Vaginal Pap smear, abnormal     There are no active problems to display for this patient.   Past Surgical History:  Procedure Laterality Date  . CERVICAL BIOPSY  W/ LOOP ELECTRODE EXCISION    . extraction of wisdom teeth       OB History    Gravida  3   Para  2   Term  2   Preterm      AB  1   Living  2     SAB  1   TAB      Ectopic      Multiple  0   Live Births  2            Home Medications    Prior to Admission medications   Medication Sig Start Date End Date Taking? Authorizing Provider  benzonatate (TESSALON) 100 MG capsule Take 1 capsule (100 mg total) by mouth every 8 (eight) hours. Patient not taking: Reported on 08/10/2018 05/25/18   Maczis, Elmer Sow, PA-C  cyclobenzaprine (FLEXERIL) 5 MG tablet Take 1 tablet (5 mg total) by mouth 3 (three) times daily as needed for muscle spasms. Patient not taking: Reported on 05/25/2018 03/29/18   Charlynne Pander, MD  ibuprofen (ADVIL,MOTRIN) 200 MG tablet Take 400-600 mg by mouth every 6 (six) hours as needed  for headache or mild pain.    [provider]  ibuprofen (ADVIL,MOTRIN) 800 MG tablet Take 1 tablet (800 mg total) by mouth 3 (three) times daily. Patient not taking: Reported on 08/10/2018 03/29/18   Charlynne Pander, MD  medroxyPROGESTERone (DEPO-PROVERA) 150 MG/ML injection Inject 1 mL (150 mg total) into the muscle every 3 (three) months. 12/21/17   Anyanwu, Jethro Bastos, MD  Multiple Vitamin (MULTIVITAMIN WITH MINERALS) TABS tablet Take 1 tablet by mouth daily.    [provider]  ondansetron (ZOFRAN ODT) 4 MG disintegrating tablet Take 1 tablet (4 mg total) by mouth every 8 (eight) hours as needed for nausea or vomiting. 08/10/18   Samuel Jester, DO  ondansetron (ZOFRAN) 4 MG tablet Take 1 tablet (4 mg total) by mouth every 6 (six) hours. 09/19/18   Maxwell Caul, PA-C    Family History Family History  Adopted: Yes    Social History Social History   Tobacco Use  . Smoking status: Current Some Day Smoker    Packs/day: 0.25  . Smokeless tobacco: Never Used  Substance Use Topics  . Alcohol use: Yes    Frequency: Never    Comment: socially  . Drug use: No  Allergies   Beef-derived products; Ultram [tramadol]; Allegra [fexofenadine]; Augmentin [amoxicillin-pot clavulanate]; Benadryl [diphenhydramine]; Naldecon senior [guaifenesin]; Sudafed [pseudoephedrine]; and Zyrtec [cetirizine]   Review of Systems Review of Systems  Constitutional: Positive for chills and fever.  HENT: Positive for congestion, sinus pressure and sinus pain. Negative for ear pain and sore throat.   Respiratory: Positive for cough. Negative for shortness of breath.   Cardiovascular: Negative for chest pain.  Gastrointestinal: Positive for vomiting (post tussive). Negative for abdominal pain, blood in stool and constipation.  Genitourinary: Negative for dysuria.     Physical Exam Updated Vital Signs BP 128/80 (BP Location: Left Arm)   Pulse (!) 103   Temp 99.8 F (37.7 C)  (Oral)   Resp 16   LMP 11/21/2018   SpO2 100%   Physical Exam Vitals signs and nursing note reviewed.  Constitutional:      General: She is not in acute distress.    Appearance: She is well-developed.  HENT:     Head: Normocephalic and atraumatic.     Right Ear: Tympanic membrane, ear canal and external ear normal. Tympanic membrane is not perforated, erythematous, retracted or bulging.     Left Ear: Tympanic membrane, ear canal and external ear normal. Tympanic membrane is not perforated, erythematous, retracted or bulging.     Nose: Congestion present.     Comments: No sinus tenderness.     Mouth/Throat:     Pharynx: Uvula midline. No oropharyngeal exudate or posterior oropharyngeal erythema.     Comments: Posterior oropharynx is symmetric appearing. Patient tolerating own secretions without difficulty. No trismus. No drooling. No hot potato voice. No swelling beneath the tongue, submandibular compartment is soft.  Eyes:     General:        Right eye: No discharge.        Left eye: No discharge.     Conjunctiva/sclera: Conjunctivae normal.     Pupils: Pupils are equal, round, and reactive to light.  Neck:     Musculoskeletal: Normal range of motion and neck supple.  Cardiovascular:     Rate and Rhythm: Regular rhythm. Tachycardia present.     Heart sounds: No murmur.  Pulmonary:     Effort: Pulmonary effort is normal. No respiratory distress.     Breath sounds: Normal breath sounds. No wheezing or rales.  Abdominal:     General: There is no distension.     Palpations: Abdomen is soft.     Tenderness: There is no abdominal tenderness.  Lymphadenopathy:     Cervical: No cervical adenopathy.  Skin:    General: Skin is warm and dry.     Findings: No rash.  Neurological:     Mental Status: She is alert.  Psychiatric:        Behavior: Behavior normal.      ED Treatments / Results  Labs (all labs ordered are listed, but only abnormal results are displayed) Labs  Reviewed - No data to display  EKG None  Radiology Dg Chest 2 View  Result Date: 12/05/2018 CLINICAL DATA:  Chest pain, cough and shortness of breath with fever since yesterday. EXAM: CHEST - 2 VIEW COMPARISON:  09/19/2018. FINDINGS: Normal sized heart. Clear lungs. Stable mild peribronchial thickening. Normal appearing bones. IMPRESSION: No acute disease. Stable mild bronchitic changes. Electronically Signed   By: Beckie Salts M.D.   On: 12/05/2018 17:28    Procedures Procedures (including critical care time)  Medications Ordered in ED Medications  acetaminophen (TYLENOL) tablet 650 mg (  650 mg Oral Given 12/05/18 1702)     Initial Impression / Assessment and Plan / ED Course  I have reviewed the triage vital signs and the nursing notes.  Pertinent labs & imaging results that were available during my care of the patient were reviewed by me and considered in my medical decision making (see chart for details).   Patient presents to the emergency department with flulike symptoms. Nontoxic appearing, HR mildly elevated, vitals otherwise WNL. No sinus tenderness, sxs < 7 days, doubt acute bacterial sinusitis. Centor 1 for reported fevers- doubt strep. No AOM on exam. Lungs CTA, CXR negative for infiltrate- doubt pneumonia. No wheezing/respiratory distress. No meningismus. Suspect influenza/influenza like illness. Discussed risks/benefits of tamiflu- patient declined. Will treat supportively. I discussed results, treatment plan, need for follow-up, and return precautions with the patient. Provided opportunity for questions, patient confirmed understanding and is in agreement with plan.    Final Clinical Impressions(s) / ED Diagnoses   Final diagnoses:  Influenza-like illness    ED Discharge Orders         Ordered    fluticasone (FLONASE) 50 MCG/ACT nasal spray  Daily     12/05/18 1802    benzonatate (TESSALON) 100 MG capsule  Every 8 hours     12/05/18 1802    naproxen (NAPROSYN) 500  MG tablet  2 times daily     12/05/18 1802    guaiFENesin (ROBITUSSIN) 100 MG/5ML liquid  3 times daily PRN     12/05/18 1813           Cordarrius Coad, Pleas Koch, PA-C 12/05/18 1804  Addendum: Patient expressed concern that tessalon was not covered by her insurance in the past. Discussed other options given she has an allergy listed to Guaifenesin- she is adamant that she is not allergic to this and has taken it before without difficulty. She states this is incorrect. Updated prescription sent.     Cherly Anderson, PA-C 12/05/18 1816    Virgina Norfolk, DO 12/05/18 2338

## 2018-12-05 NOTE — Discharge Instructions (Addendum)
You were seen in the emergency today for upper respiratory symptoms, we suspect you have the flu or a flu like illness.  Your chest xray was normal.   -Flonase to be used 1 spray in each nostril daily.  This medication is used to treat your congestion.  -Tessalon can be taken once every 8 hours as needed.  This medication is used to treat your cough.  -- Naproxen is a nonsteroidal anti-inflammatory medication that will help with pain and swelling. Be sure to take this medication as prescribed with food, 1 pill every 12 hours,  It should be taken with food, as it can cause stomach upset, and more seriously, stomach bleeding. Do not take other nonsteroidal anti-inflammatory medications with this such as Advil, Motrin, Aleve, Mobic, Goodie Powder, or Motrin.     You make take Tylenol per over the counter dosing with these medications.   We have prescribed you new medication(s) today. Discuss the medications prescribed today with your pharmacist as they can have adverse effects and interactions with your other medicines including over the counter and prescribed medications. Seek medical evaluation if you start to experience new or abnormal symptoms after taking one of these medicines, seek care immediately if you start to experience difficulty breathing, feeling of your throat closing, facial swelling, or rash as these could be indications of a more serious allergic reaction   You will need to follow-up with your primary care provider in 3 days if your symptoms have not improved.  If you do not have a primary care provider one is provided in your discharge instructions.  Return to the emergency department for any new or worsening symptoms including but not limited to persistent fever for 5 days, difficulty breathing, chest pain, rashes, passing out, or any other concerns.

## 2019-01-10 ENCOUNTER — Encounter (HOSPITAL_COMMUNITY): Payer: Self-pay | Admitting: *Deleted

## 2019-01-10 ENCOUNTER — Emergency Department (HOSPITAL_COMMUNITY)
Admission: EM | Admit: 2019-01-10 | Discharge: 2019-01-10 | Disposition: A | Discharge status: Home / Self Care | Payer: Medicaid Other | Attending: Emergency Medicine | Admitting: Emergency Medicine

## 2019-01-10 ENCOUNTER — Other Ambulatory Visit: Payer: Self-pay

## 2019-01-10 DIAGNOSIS — K029 Dental caries, unspecified: Secondary | ICD-10-CM | POA: Insufficient documentation

## 2019-01-10 DIAGNOSIS — F17210 Nicotine dependence, cigarettes, uncomplicated: Secondary | ICD-10-CM | POA: Insufficient documentation

## 2019-01-10 DIAGNOSIS — Z79899 Other long term (current) drug therapy: Secondary | ICD-10-CM | POA: Insufficient documentation

## 2019-01-10 DIAGNOSIS — K047 Periapical abscess without sinus: Secondary | ICD-10-CM | POA: Insufficient documentation

## 2019-01-10 DIAGNOSIS — K0889 Other specified disorders of teeth and supporting structures: Secondary | ICD-10-CM | POA: Diagnosis present

## 2019-01-10 MED ORDER — AMOXICILLIN 500 MG PO CAPS
500.0000 mg | ORAL_CAPSULE | Freq: Once | ORAL | Status: AC
  Administered 2019-01-10: 500 mg via ORAL
  Filled 2019-01-10: qty 1

## 2019-01-10 MED ORDER — AMOXICILLIN 500 MG PO CAPS: 500.0000 mg | ORAL_CAPSULE | Freq: Three times a day (TID) | ORAL | 0 refills | Status: DC

## 2019-01-10 MED ORDER — BUPIVACAINE-EPINEPHRINE (PF) 0.5% -1:200000 IJ SOLN
INTRAMUSCULAR | Status: AC
  Filled 2019-01-10: qty 1.8

## 2019-01-10 NOTE — ED Provider Notes (Signed)
[facility]-EMERGENCY DEPT Provider Note   CSN: [id number] Arrival date & time: 01/10/19  1714    History   Chief Complaint Chief Complaint  Patient presents with  . Dental Pain    HPI Christine Orozco is a 33 y.o. female.     The history is provided by the patient.  Dental Pain  Location:  Upper Upper teeth location:  14/LU 1st molar Quality:  Pulsating, localized, sharp and throbbing Severity:  Severe Onset quality:  Gradual Duration:  5 days Timing:  Constant Progression:  Worsening Chronicity:  New Context: abscess, dental caries, filling fell out and poor dentition   Relieved by:  None tried Worsened by:  Cold food/drink, hot food/drink and touching Ineffective treatments:  NSAIDs Associated symptoms: facial pain and facial swelling   Associated symptoms: no congestion, no difficulty swallowing, no drooling, no fever, no gum swelling, no neck swelling, no oral bleeding, no oral lesions and no trismus   Risk factors: lack of dental care     Past Medical History:  Diagnosis Date  . Hepatitis B antibody positive   . Vaginal Pap smear, abnormal     There are no active problems to display for this patient.   Past Surgical History:  Procedure Laterality Date  . CERVICAL BIOPSY  W/ LOOP ELECTRODE EXCISION    . extraction of wisdom teeth       OB History    Gravida  3   Para  2   Term  2   Preterm      AB  1   Living  2     SAB  1   TAB      Ectopic      Multiple  0   Live Births  2            Home Medications    Prior to Admission medications   Medication Sig Start Date End Date Taking? Authorizing Provider  amoxicillin (AMOXIL) 500 MG capsule Take 1 capsule (500 mg total) by mouth 3 (three) times daily. 01/10/19   Gwyneth Sprout, MD  benzonatate (TESSALON) 100 MG capsule Take 1 capsule (100 mg total) by mouth every 8 (eight) hours. 12/05/18   Petrucelli, Samantha R, PA-C  fluticasone (FLONASE) 50 MCG/ACT nasal  spray Place 1 spray into both nostrils daily. 12/05/18   Petrucelli, Samantha R, PA-C  guaiFENesin (ROBITUSSIN) 100 MG/5ML liquid Take 5-10 mLs (100-200 mg total) by mouth 3 (three) times daily as needed for cough. 12/05/18   Petrucelli, Samantha R, PA-C  medroxyPROGESTERone (DEPO-PROVERA) 150 MG/ML injection Inject 1 mL (150 mg total) into the muscle every 3 (three) months. 12/21/17   Anyanwu, Jethro Bastos, MD  Multiple Vitamin (MULTIVITAMIN WITH MINERALS) TABS tablet Take 1 tablet by mouth daily.    [provider]  naproxen (NAPROSYN) 500 MG tablet Take 1 tablet (500 mg total) by mouth 2 (two) times daily. 12/05/18   Petrucelli, Samantha R, PA-C  ondansetron (ZOFRAN ODT) 4 MG disintegrating tablet Take 1 tablet (4 mg total) by mouth every 8 (eight) hours as needed for nausea or vomiting. 08/10/18   Samuel Jester, DO  ondansetron (ZOFRAN) 4 MG tablet Take 1 tablet (4 mg total) by mouth every 6 (six) hours. 09/19/18   Maxwell Caul, PA-C    Family History Family History  Adopted: Yes    Social History Social History   Tobacco Use  . Smoking status: Current Some Day Smoker    Packs/day: 0.25  .  Smokeless tobacco: Never Used  Substance Use Topics  . Alcohol use: Yes    Frequency: Never    Comment: socially  . Drug use: No     Allergies   Beef-derived products; Ultram [tramadol]; Allegra [fexofenadine]; Augmentin [amoxicillin-pot clavulanate]; Benadryl [diphenhydramine]; Naldecon senior [guaifenesin]; Sudafed [pseudoephedrine]; and Zyrtec [cetirizine]   Review of Systems Review of Systems  Constitutional: Negative for fever.  HENT: Positive for facial swelling. Negative for congestion, drooling and mouth sores.   All other systems reviewed and are negative.    Physical Exam Updated Vital Signs BP (!) 146/90 (BP Location: Left Arm)   Pulse 88   Temp 98 F (36.7 C) (Oral)   Resp 16   SpO2 99%   Physical Exam Vitals signs and nursing note reviewed.  Constitutional:       Appearance: Normal appearance. She is normal weight.  HENT:     Head: Normocephalic.      Mouth/Throat:     Mouth: Mucous membranes are moist.     Dentition: Dental caries and dental abscesses present.   Eyes:     Pupils: Pupils are equal, round, and reactive to light.  Cardiovascular:     Rate and Rhythm: Normal rate.  Pulmonary:     Effort: Pulmonary effort is normal.  Skin:    General: Skin is warm.  Neurological:     General: No focal deficit present.     Mental Status: She is alert and oriented to person, place, and time. Mental status is at baseline.  Psychiatric:        Mood and Affect: Mood normal.        Behavior: Behavior normal.        Thought Content: Thought content normal.      ED Treatments / Results  Labs (all labs ordered are listed, but only abnormal results are displayed) Labs Reviewed - No data to display  EKG None  Radiology No results found.  Procedures Dental Block Date/Time: 01/10/2019 5:43 PM Performed by: Gwyneth SproutPlunkett, Josha Weekley, MD Authorized by: Gwyneth SproutPlunkett, Kwanza Cancelliere, MD   Consent:    Consent obtained:  Verbal   Consent given by:  Patient   Risks discussed:  Pain   Alternatives discussed:  No treatment Indications:    Indications: dental abscess and dental pain   Location:    Anesthesia block type: periapical. Procedure details (see MAR for exact dosages):    Syringe type:  Aspirating dental syringe   Needle gauge:  27 G   Anesthetic injected:  Bupivacaine 0.5% WITH epi   Injection procedure:  Introduced needle, incremental injection and negative aspiration for blood Post-procedure details:    Outcome:  Pain relieved   Patient tolerance of procedure:  Tolerated well, no immediate complications   (including critical care time)  Medications Ordered in ED Medications  bupivacaine-epinephrine (MARCAINE W/ EPI) 0.5% -1:200000 injection (has no administration in time range)  amoxicillin (AMOXIL) capsule 500 mg (has no administration  in time range)     Initial Impression / Assessment and Plan / ED Course  I have reviewed the triage vital signs and the nursing notes.  Pertinent labs & imaging results that were available during my care of the patient were reviewed by me and considered in my medical decision making (see chart for details).        Pt with dental caries and facial swelling.  No signs of ludwig's angina or difficulty swallowing and no systemic symptoms. Will treat with amoxicillin and have pt f/u with  dentist.  Patient states she does have an allergy to Augmentin but it is not the amoxicillin component.   Final Clinical Impressions(s) / ED Diagnoses   Final diagnoses:  Dental abscess    ED Discharge Orders         Ordered    amoxicillin (AMOXIL) 500 MG capsule  3 times daily     01/10/19 1739           Gwyneth Sprout, MD 01/10/19 1744

## 2019-01-10 NOTE — ED Triage Notes (Signed)
Pt reports L upper dental pain x 3 days.  Reports a chipped tooth.  She tool Ibuprofen last at 1300.

## 2019-03-28 IMAGING — CR DG CHEST 2V
2 series · 2 of 2 positions shown · non-contrast
Comparison: October 11, 2014

CLINICAL DATA: Cough and chest pain.  Shortness of breath

EXAM:
CHEST - 2 VIEW

[w chest pa]
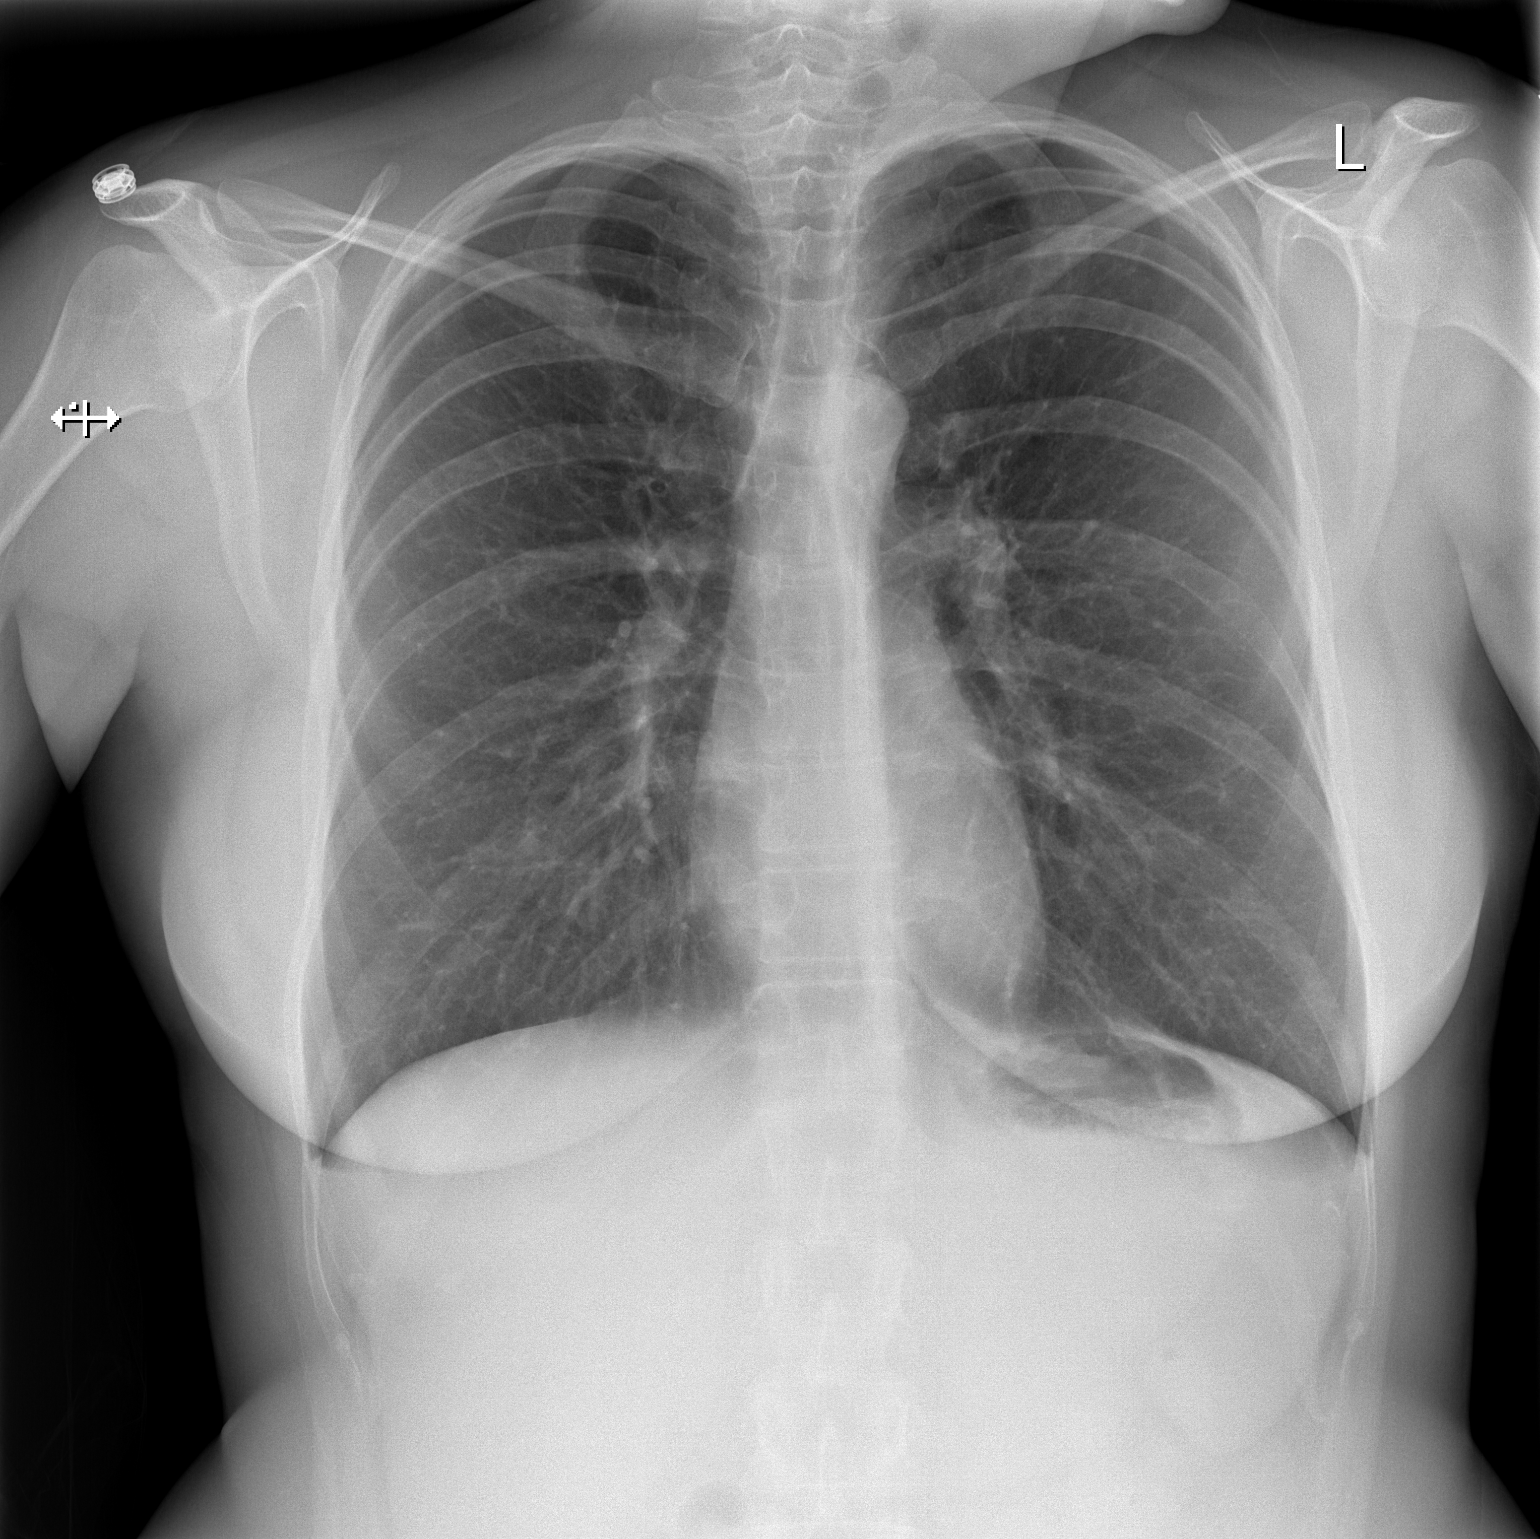

[w chest lat]
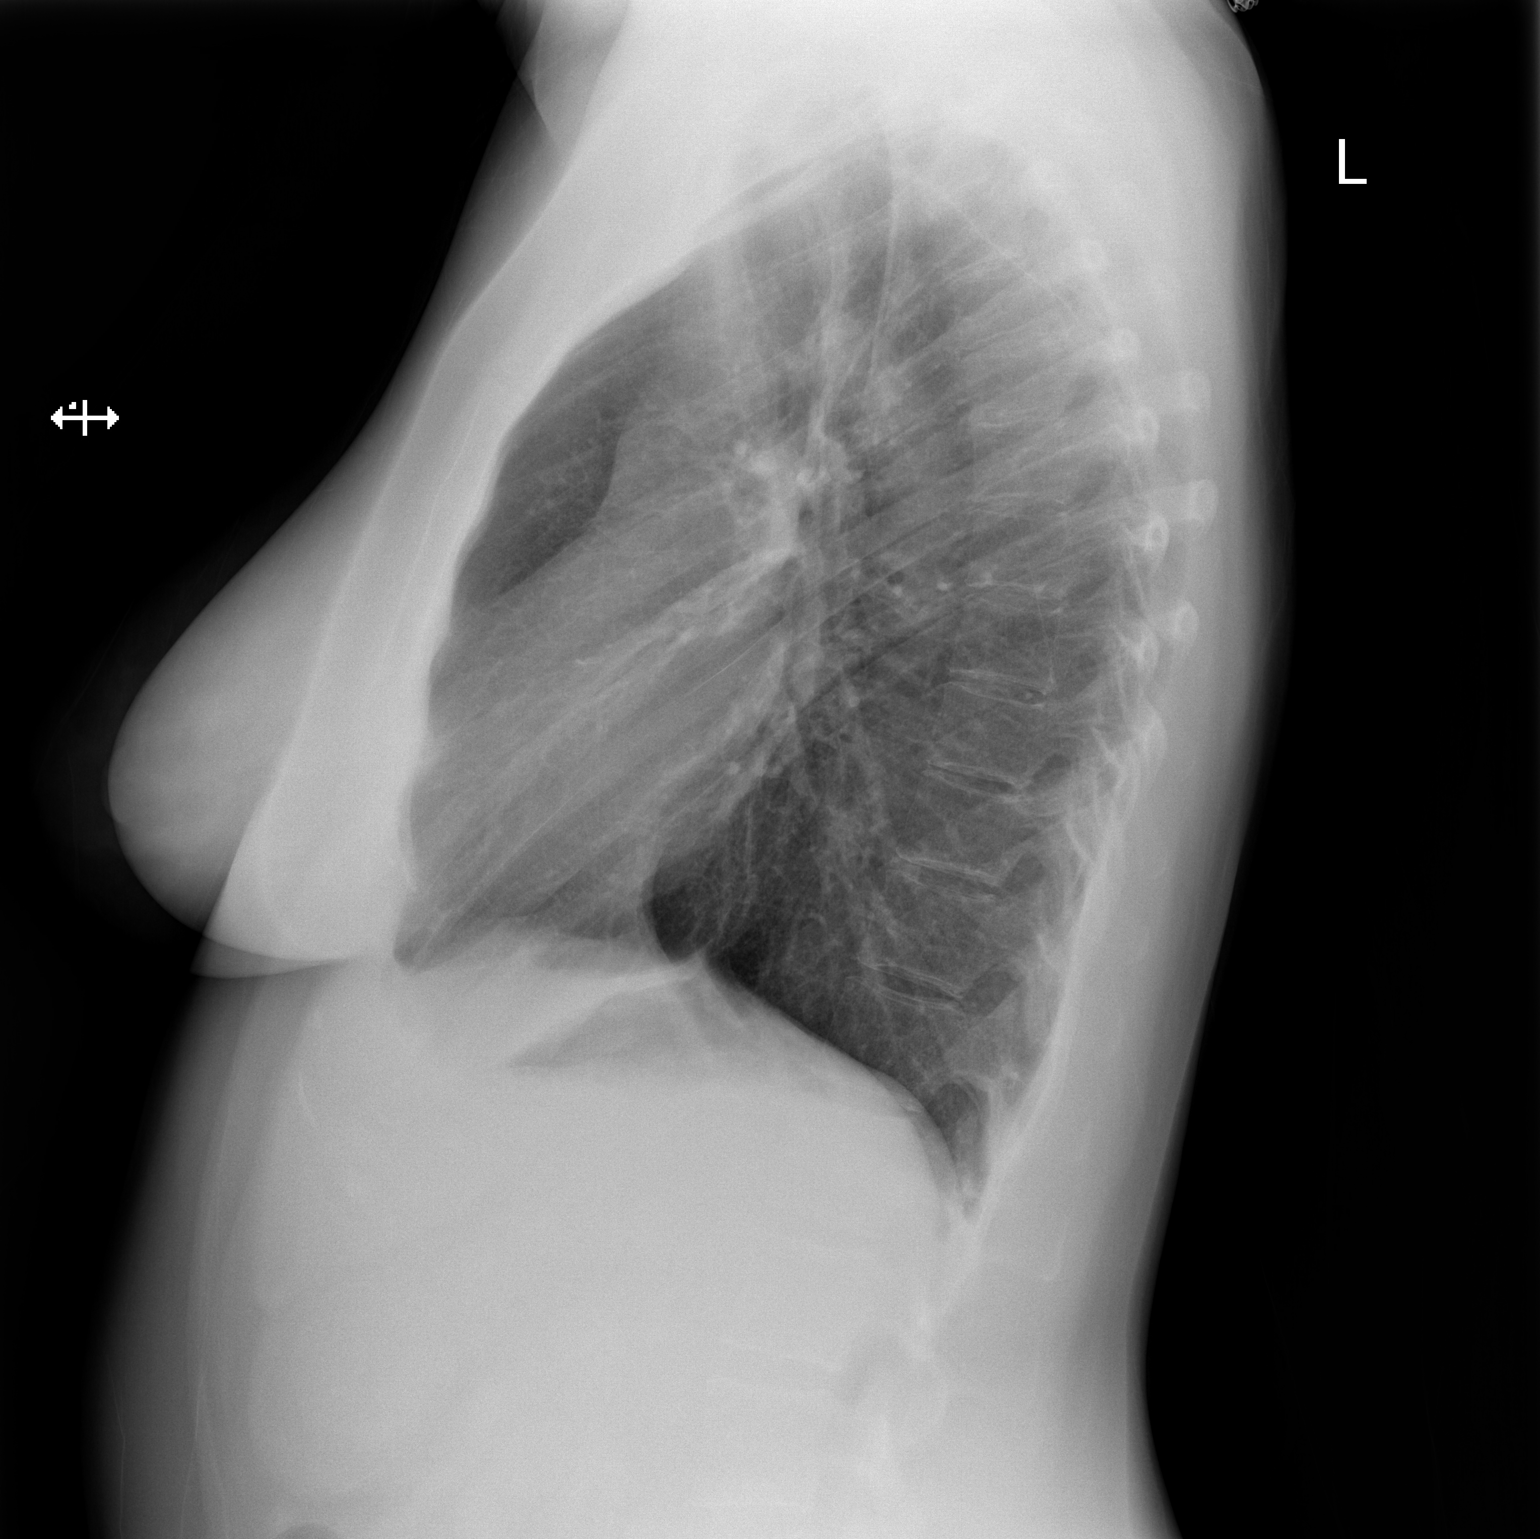

[2 of 2 positions shown; findings below may reference images not displayed]

FINDINGS: No edema or consolidation. Heart size and pulmonary vascularity are
normal. No adenopathy. No pneumothorax. No bone lesions.
IMPRESSION: No edema or consolidation.

## 2019-06-13 IMAGING — CR DG ABDOMEN ACUTE W/ 1V CHEST
3 series · 3 of 3 positions shown · non-contrast
Comparison: Chest x-ray dated May 25, 2018. CT abdomen pelvis
dated May 16, 2016.

CLINICAL DATA: Nausea, vomiting, and diarrhea for the past 3 days.

EXAM:
DG ABDOMEN ACUTE W/ 1V CHEST

[w chest pa]
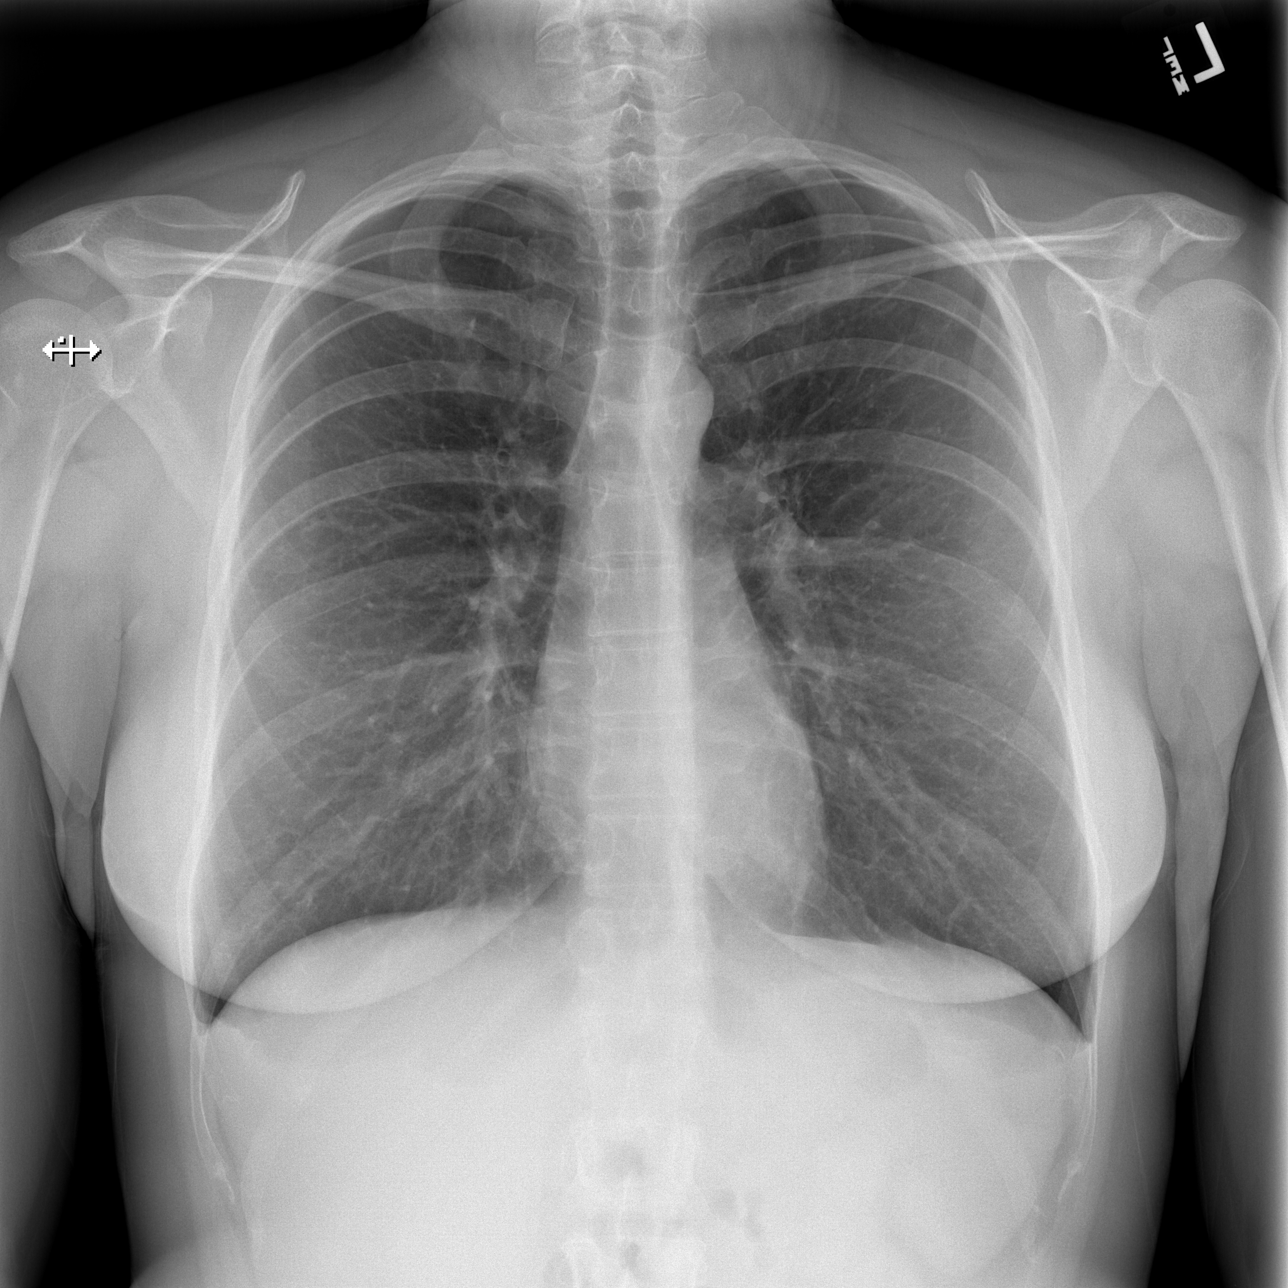

[w abdomen upright]
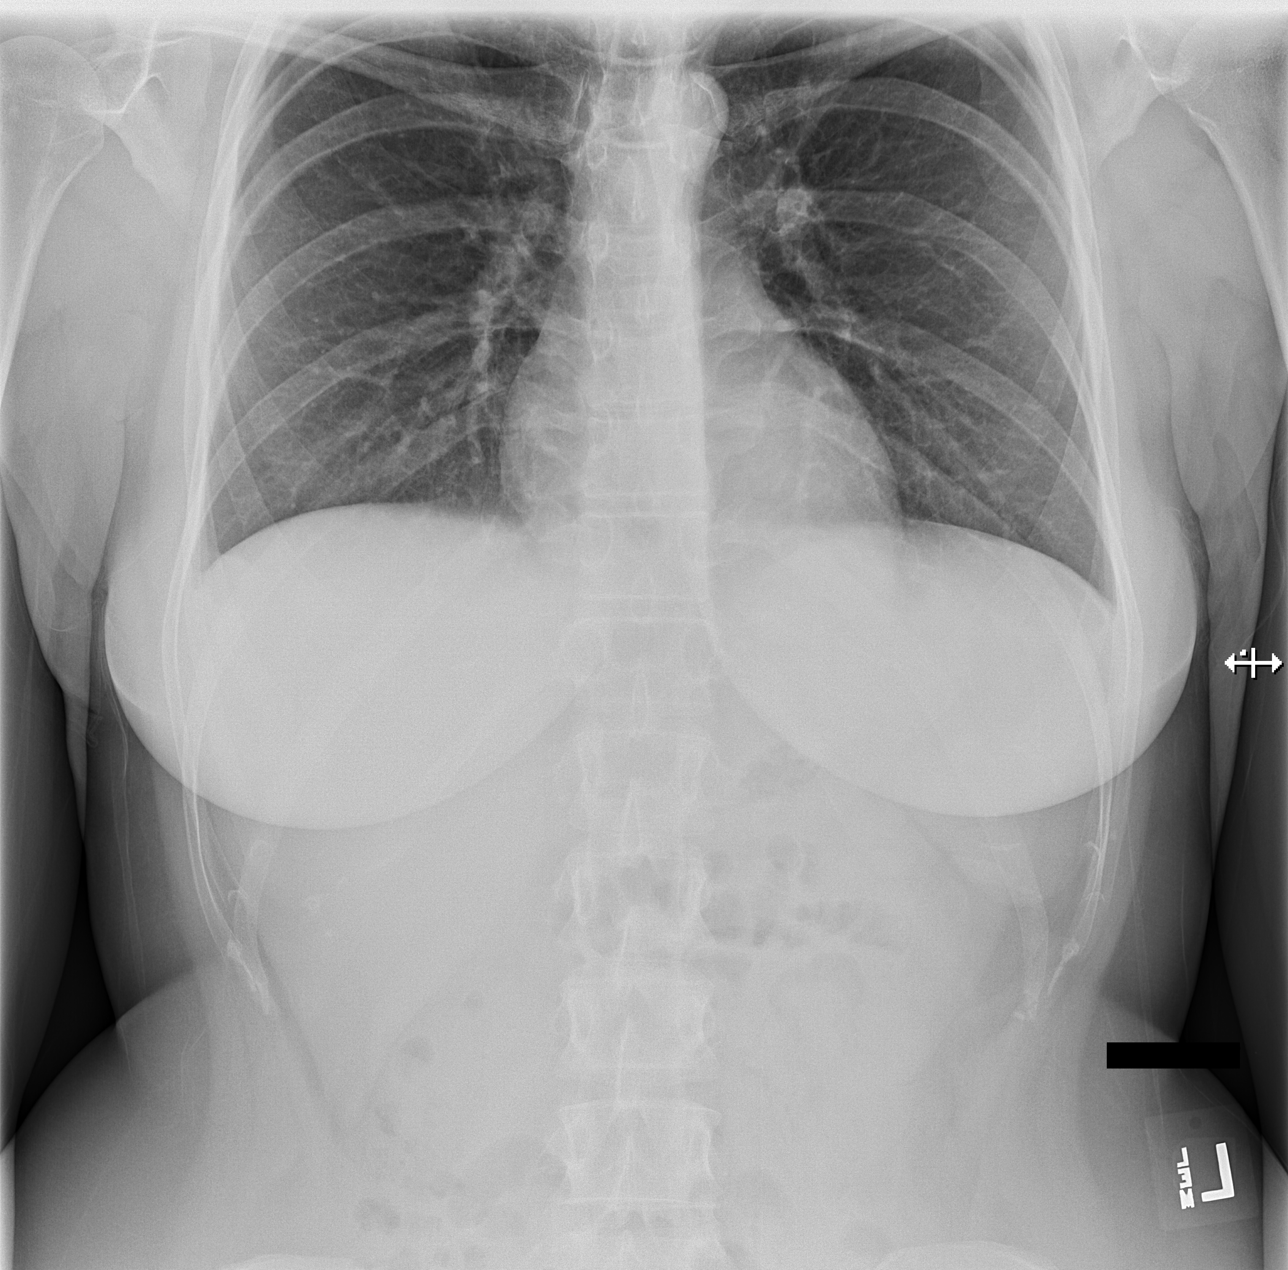

[t abdomen supine]
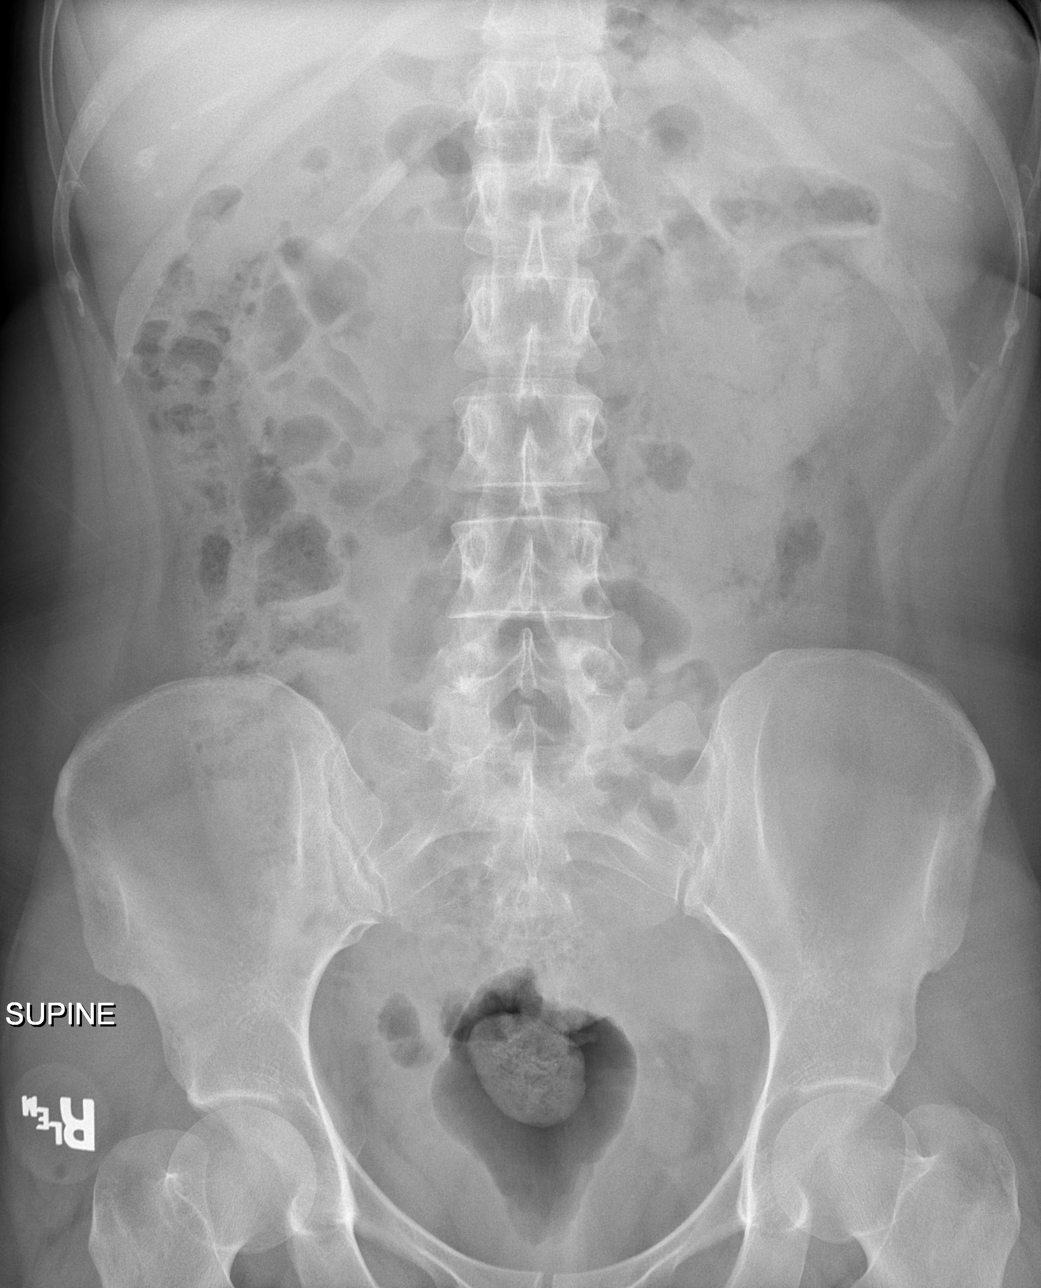

[3 of 3 positions shown; findings below may reference images not displayed]

FINDINGS: The cardiomediastinal silhouette is normal in size. Normal pulmonary
vascularity. No focal consolidation, pleural effusion, or
pneumothorax.

There is no evidence of dilated bowel loops or free intraperitoneal
air. No radiopaque calculi or other significant radiographic
abnormality is seen.

No acute osseous abnormality.
IMPRESSION: Negative abdominal radiographs.  No acute cardiopulmonary disease.

## 2019-07-23 IMAGING — CR DG CHEST 2V
2 series · 2 of 2 positions shown · non-contrast
Comparison: 08/10/2018

CLINICAL DATA: Fever.  Flu-like symptoms.  Nausea be

EXAM:
CHEST - 2 VIEW

[w chest pa]
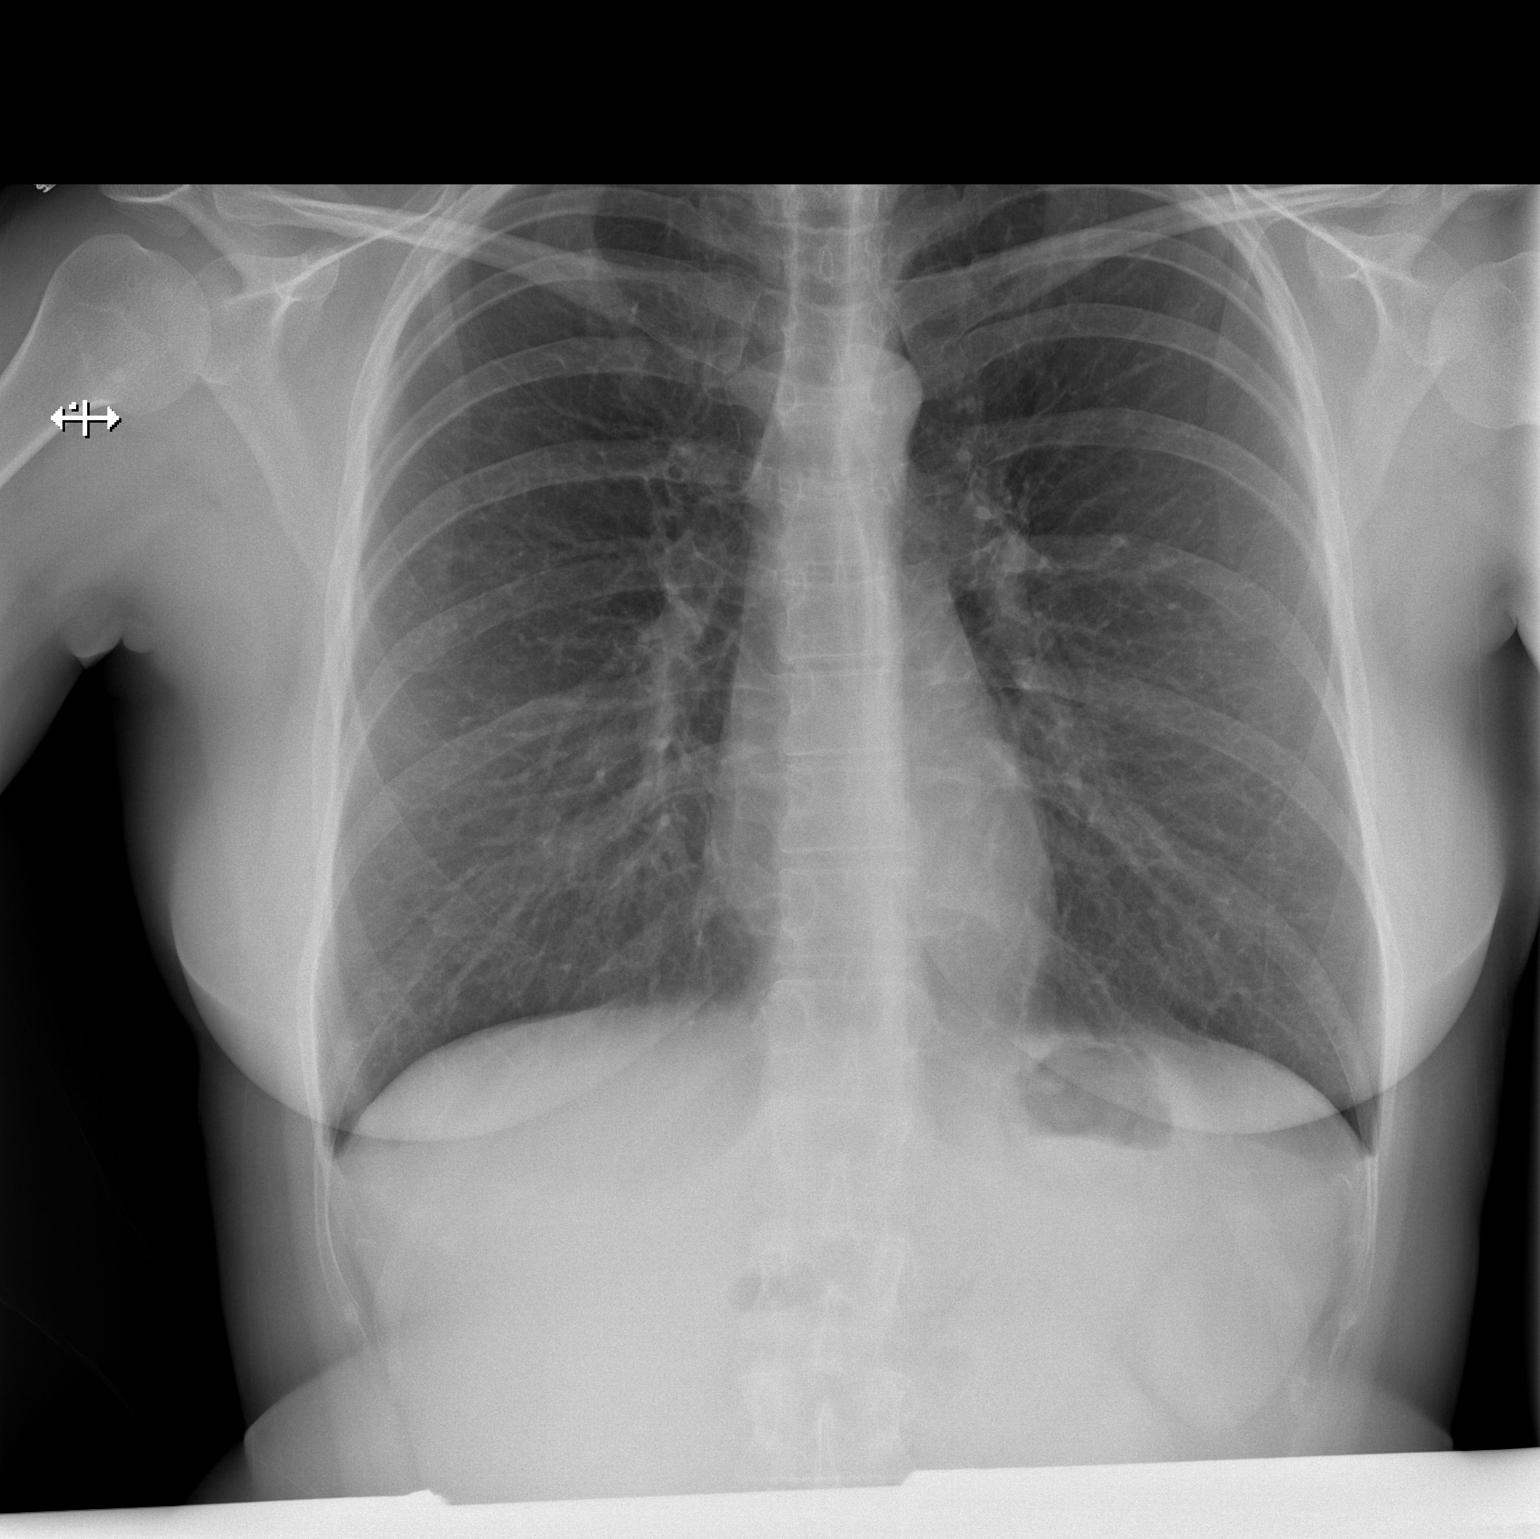

[w chest lat]
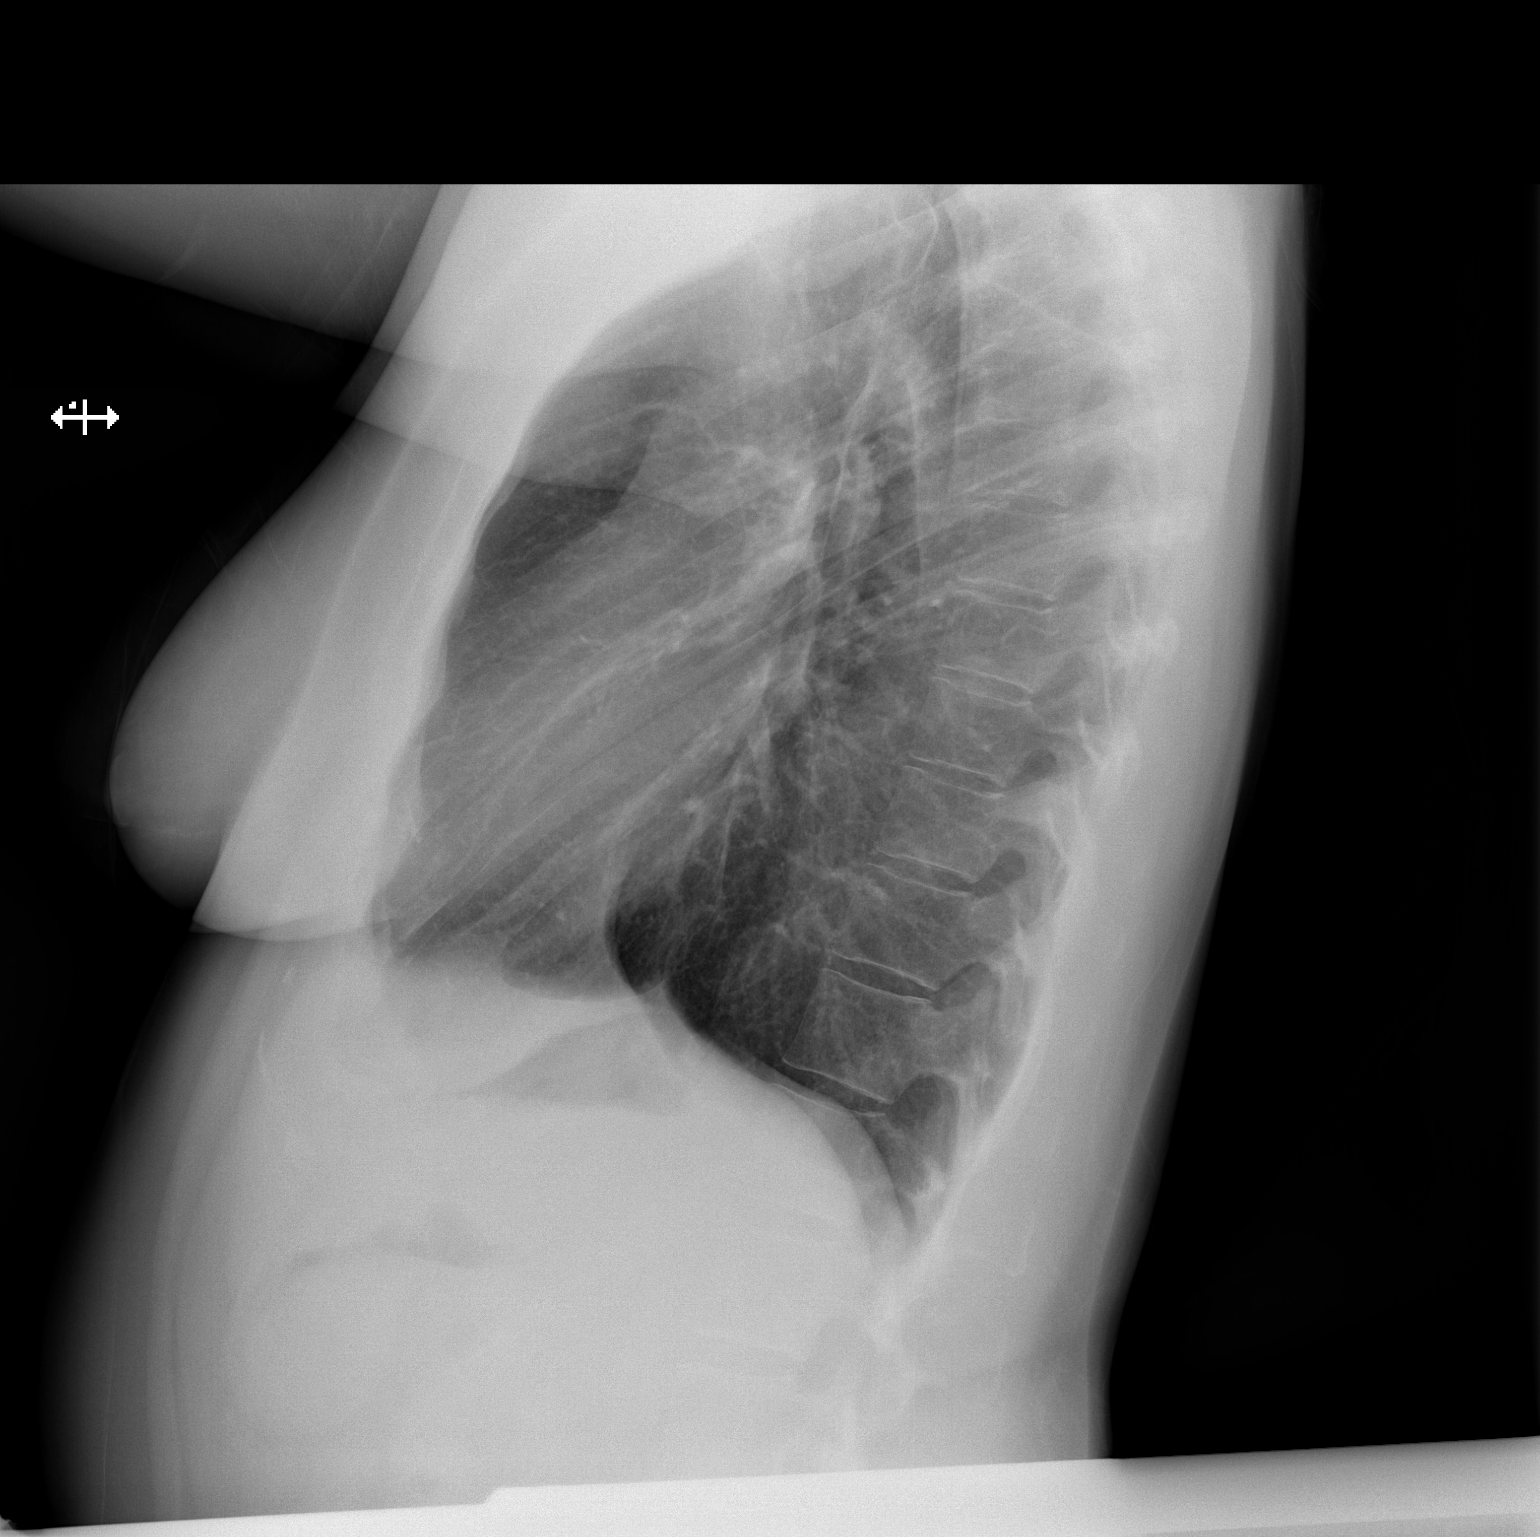

[2 of 2 positions shown; findings below may reference images not displayed]

FINDINGS: The heart size and mediastinal contours are within normal limits.
Both lungs are clear. The visualized skeletal structures are
unremarkable.
IMPRESSION: No active cardiopulmonary disease.

## 2019-09-03 ENCOUNTER — Encounter (HOSPITAL_COMMUNITY): Payer: Self-pay | Admitting: Emergency Medicine

## 2019-09-03 ENCOUNTER — Emergency Department (HOSPITAL_COMMUNITY): Payer: Medicaid Other

## 2019-09-03 ENCOUNTER — Other Ambulatory Visit: Payer: Self-pay

## 2019-09-03 ENCOUNTER — Emergency Department (HOSPITAL_COMMUNITY)
Admission: EM | Admit: 2019-09-03 | Discharge: 2019-09-03 | Disposition: A | Discharge status: Home / Self Care | Payer: Medicaid Other | Attending: Emergency Medicine | Admitting: Emergency Medicine

## 2019-09-03 DIAGNOSIS — R197 Diarrhea, unspecified: Secondary | ICD-10-CM | POA: Diagnosis not present

## 2019-09-03 DIAGNOSIS — R1110 Vomiting, unspecified: Secondary | ICD-10-CM | POA: Diagnosis not present

## 2019-09-03 DIAGNOSIS — F17200 Nicotine dependence, unspecified, uncomplicated: Secondary | ICD-10-CM | POA: Insufficient documentation

## 2019-09-03 DIAGNOSIS — R1011 Right upper quadrant pain: Secondary | ICD-10-CM

## 2019-09-03 DIAGNOSIS — Z79899 Other long term (current) drug therapy: Secondary | ICD-10-CM | POA: Insufficient documentation

## 2019-09-03 LAB — COMPREHENSIVE METABOLIC PANEL
ALT: 23 U/L (ref 0–44)
AST: 24 U/L (ref 15–41)
Albumin: 4.8 g/dL (ref 3.5–5.0)
Alkaline Phosphatase: 92 U/L (ref 38–126)
Anion gap: 9 (ref 5–15)
BUN: 12 mg/dL (ref 6–20)
CO2: 22 mmol/L (ref 22–32)
Calcium: 9.8 mg/dL (ref 8.9–10.3)
Chloride: 106 mmol/L (ref 98–111)
Creatinine, Ser: 0.78 mg/dL (ref 0.44–1.00)
GFR calc Af Amer: >60 mL/min (ref >60)
GFR calc non Af Amer: >60 mL/min (ref >60)
Glucose, Bld: 107 mg/dL — ABNORMAL HIGH (ref 70–99)
Potassium: 3.8 mmol/L (ref 3.5–5.1)
Sodium: 137 mmol/L (ref 135–145)
Total Bilirubin: 0.6 mg/dL (ref 0.3–1.2)
Total Protein: 8.1 g/dL (ref 6.5–8.1)

## 2019-09-03 LAB — CBC
HCT: 48.4 % — ABNORMAL HIGH (ref 36.0–46.0)
Hemoglobin: 16 g/dL — ABNORMAL HIGH (ref 12.0–15.0)
MCH: 29.9 pg (ref 26.0–34.0)
MCHC: 33.1 g/dL (ref 30.0–36.0)
MCV: 90.5 fL (ref 80.0–100.0)
Platelets: 345 10*3/uL (ref 150–400)
RBC: 5.35 MIL/uL — ABNORMAL HIGH (ref 3.87–5.11)
RDW: 13.5 % (ref 11.5–15.5)
WBC: 12.7 10*3/uL — ABNORMAL HIGH (ref 4.0–10.5)
nRBC: 0 % (ref 0.0–0.2)

## 2019-09-03 LAB — URINALYSIS, ROUTINE W REFLEX MICROSCOPIC
Bilirubin Urine: NEGATIVE
Glucose, UA: NEGATIVE mg/dL
Ketones, ur: NEGATIVE mg/dL
Leukocytes,Ua: NEGATIVE
Nitrite: NEGATIVE
Protein, ur: NEGATIVE mg/dL
Specific Gravity, Urine: 1.024 (ref 1.005–1.030)
pH: 5 (ref 5.0–8.0)

## 2019-09-03 LAB — I-STAT BETA HCG BLOOD, ED (MC, WL, AP ONLY): I-stat hCG, quantitative: <5 m[IU]/mL (ref <5)

## 2019-09-03 LAB — LIPASE, BLOOD: Lipase: 24 U/L (ref 11–51)

## 2019-09-03 MED ORDER — IBUPROFEN 800 MG PO TABS: 800.0000 mg | ORAL_TABLET | Freq: Three times a day (TID) | ORAL | 0 refills | Status: DC | PRN

## 2019-09-03 MED ORDER — ONDANSETRON HCL 4 MG PO TABS
4.0000 mg | ORAL_TABLET | Freq: Once | ORAL | Status: AC
  Administered 2019-09-03: 4 mg via ORAL
  Filled 2019-09-03: qty 1

## 2019-09-03 MED ORDER — ALUM & MAG HYDROXIDE-SIMETH 200-200-20 MG/5ML PO SUSP
30.0000 mL | Freq: Once | ORAL | Status: AC
  Administered 2019-09-03: 30 mL via ORAL
  Filled 2019-09-03: qty 30

## 2019-09-03 MED ORDER — ONDANSETRON HCL 4 MG PO TABS: 4.0000 mg | ORAL_TABLET | Freq: Four times a day (QID) | ORAL | 0 refills | Status: DC | PRN

## 2019-09-03 NOTE — ED Provider Notes (Signed)
[facility]-EMERGENCY DEPT Provider Note   CSN: [id number] Arrival date & time: 09/03/19  1014     History   Chief Complaint Chief Complaint  Patient presents with  . Abdominal Pain    HPI Christine Orozco is a 33 y.o. female with past medical history as listed below presents to emergency room today with chief complaint of right upper quadrant abdominal pain x 3 days.  She reports pain is intermittent and that pain is typically worse after eating, specifically bacon. She rates the pain /Patient in severity. Pain occasionally radiates to right shoulder. She states she had an episode of sharp right upper quadrant pain yesterday at work and had 1 episode of nonbloody nonbilious emesis so her employer wanted her to be evaluated.  She has tried taking tylenol and ibuprofen with transient symptom relief. She admits to diarrhea, but states her menses is due next week and she usually has diarrhea prior to it. She denies fever, chills, chest pain, gross hematuria, urinary frequency, pelvic pain, vaginal discharge, abnormal vaginal bleeding.  Denies any sick contacts or suspicious food intake. Denies contact with anyone positive for Covid-19. Denies abdominal surgical history.  History provided by patient with additional history obtained from chart review.    Past Medical History:  Diagnosis Date  . Hepatitis B antibody positive   . Vaginal Pap smear, abnormal     There are no active problems to display for this patient.   Past Surgical History:  Procedure Laterality Date  . CERVICAL BIOPSY  W/ LOOP ELECTRODE EXCISION    . extraction of wisdom teeth       OB History    Gravida  3   Para  2   Term  2   Preterm      AB  1   Living  2     SAB  1   TAB      Ectopic      Multiple  0   Live Births  2            Home Medications    Prior to Admission medications   Medication Sig Start Date End Date Taking? Authorizing Provider  amoxicillin  (AMOXIL) 500 MG capsule Take 1 capsule (500 mg total) by mouth 3 (three) times daily. 01/10/19   Gwyneth Sprout, MD  benzonatate (TESSALON) 100 MG capsule Take 1 capsule (100 mg total) by mouth every 8 (eight) hours. 12/05/18   Petrucelli, Samantha R, PA-C  fluticasone (FLONASE) 50 MCG/ACT nasal spray Place 1 spray into both nostrils daily. 12/05/18   Petrucelli, Samantha R, PA-C  guaiFENesin (ROBITUSSIN) 100 MG/5ML liquid Take 5-10 mLs (100-200 mg total) by mouth 3 (three) times daily as needed for cough. 12/05/18   Petrucelli, Samantha R, PA-C  ibuprofen (ADVIL) 800 MG tablet Take 1 tablet (800 mg total) by mouth 3 (three) times daily as needed. 09/03/19   Janayah Zavada E, PA-C  medroxyPROGESTERone (DEPO-PROVERA) 150 MG/ML injection Inject 1 mL (150 mg total) into the muscle every 3 (three) months. 12/21/17   Anyanwu, Jethro Bastos, MD  Multiple Vitamin (MULTIVITAMIN WITH MINERALS) TABS tablet Take 1 tablet by mouth daily.    [provider]  naproxen (NAPROSYN) 500 MG tablet Take 1 tablet (500 mg total) by mouth 2 (two) times daily. 12/05/18   Petrucelli, Samantha R, PA-C  ondansetron (ZOFRAN ODT) 4 MG disintegrating tablet Take 1 tablet (4 mg total) by mouth every 8 (eight) hours as needed for nausea or  vomiting. 08/10/18   Samuel Jester, DO  ondansetron (ZOFRAN) 4 MG tablet Take 1 tablet (4 mg total) by mouth every 6 (six) hours as needed for nausea or vomiting. 09/03/19   Lakeva Hollon, Caroleen Hamman, PA-C    Family History Family History  Adopted: Yes    Social History Social History   Tobacco Use  . Smoking status: Current Some Day Smoker    Packs/day: 0.25  . Smokeless tobacco: Never Used  Substance Use Topics  . Alcohol use: Yes    Frequency: Never    Comment: socially  . Drug use: No     Allergies   Beef-derived products, Ultram [tramadol], Allegra [fexofenadine], Augmentin [amoxicillin-pot clavulanate], Benadryl [diphenhydramine], Naldecon senior [guaifenesin], Sudafed  [pseudoephedrine], and Zyrtec [cetirizine]   Review of Systems Review of Systems All other systems are reviewed and are negative for acute change except as noted in the HPI.  Physical Exam Updated Vital Signs BP (!) 141/99 (BP Location: Left Arm)   Pulse 95   Temp 98.2 F (36.8 C) (Oral)   Resp 16   SpO2 100%   Physical Exam Vitals signs and nursing note reviewed.  Constitutional:      General: She is not in acute distress.    Appearance: She is not ill-appearing.  HENT:     Head: Normocephalic and atraumatic.     Right Ear: Tympanic membrane and external ear normal.     Left Ear: Tympanic membrane and external ear normal.     Nose: Nose normal.     Mouth/Throat:     Mouth: Mucous membranes are moist.     Pharynx: Oropharynx is clear.  Eyes:     General: No scleral icterus.       Right eye: No discharge.        Left eye: No discharge.     Extraocular Movements: Extraocular movements intact.     Conjunctiva/sclera: Conjunctivae normal.     Pupils: Pupils are equal, round, and reactive to light.  Neck:     Musculoskeletal: Normal range of motion.     Vascular: No JVD.  Cardiovascular:     Rate and Rhythm: Normal rate and regular rhythm.     Pulses: Normal pulses.          Radial pulses are 2+ on the right side and 2+ on the left side.     Heart sounds: Normal heart sounds.  Pulmonary:     Comments: Lungs clear to auscultation in all fields. Symmetric chest rise. No wheezing, rales, or rhonchi. Chest:     Chest wall: No tenderness.  Abdominal:     General: Bowel sounds are normal.     Tenderness: Negative signs include Murphy's sign, Rovsing's sign and McBurney's sign.     Comments: Abdomen is soft, non-distended. Tenderness to palpation of RUQ.  No rigidity, no guarding. No peritoneal signs.  Musculoskeletal: Normal range of motion.  Skin:    General: Skin is warm and dry.     Capillary Refill: Capillary refill takes less than 2 seconds.  Neurological:      Mental Status: She is oriented to person, place, and time.     GCS: GCS eye subscore is 4. GCS verbal subscore is 5. GCS motor subscore is 6.     Comments: Fluent speech, no facial droop.  Psychiatric:        Behavior: Behavior normal.      ED Treatments / Results  Labs (all labs ordered are listed, but only abnormal  results are displayed) Labs Reviewed  COMPREHENSIVE METABOLIC PANEL - Abnormal; Notable for the following components:      Result Value   Glucose, Bld 107 (*)    All other components within normal limits  CBC - Abnormal; Notable for the following components:   WBC 12.7 (*)    RBC 5.35 (*)    Hemoglobin 16.0 (*)    HCT 48.4 (*)    All other components within normal limits  URINALYSIS, ROUTINE W REFLEX MICROSCOPIC - Abnormal; Notable for the following components:   APPearance HAZY (*)    Hgb urine dipstick SMALL (*)    Bacteria, UA RARE (*)    All other components within normal limits  LIPASE, BLOOD  I-STAT BETA HCG BLOOD, ED ([facility], [facility], [facility] ONLY)    EKG None  Radiology [place] Abdomen Limited Ruq  Result Date: 09/03/2019 CLINICAL DATA:  Right upper quadrant pain EXAM: ULTRASOUND ABDOMEN LIMITED RIGHT UPPER QUADRANT COMPARISON:  None. FINDINGS: Gallbladder: No gallstones or wall thickening visualized. There is no pericholecystic fluid. No sonographic Murphy sign noted by sonographer. Common bile duct: Diameter: 2 mm. No intrahepatic or extrahepatic biliary duct dilatation. Liver: No focal lesion identified. Liver echogenicity overall is increased. Portal vein is patent on color Doppler imaging with normal direction of blood flow towards the liver. Other: None. IMPRESSION: Increased liver echogenicity, a finding indicative of a degree of hepatic steatosis. No focal liver lesions are evident. Study otherwise unremarkable. Electronically Signed   By: Bretta BangWilliam  Woodruff III M.D.   On: 09/03/2019 11:58    Procedures Procedures (including critical care time)  Medications  Ordered in ED Medications  alum & mag hydroxide-simeth (MAALOX/MYLANTA) 200-200-20 MG/5ML suspension 30 mL (30 mLs Oral Given 09/03/19 1203)  ondansetron (ZOFRAN) tablet 4 mg (4 mg Oral Given 09/03/19 1203)     Initial Impression / Assessment and Plan / ED Course  I have reviewed the triage vital signs and the nursing notes.  Pertinent labs & imaging results that were available during my care of the patient were reviewed by me and considered in my medical decision making (see chart for details).  Patient presents to the ED with complaints of abdominal pain. Patient nontoxic appearing, in no apparent distress, vitals WNL. On exam patient tender to RUQ, no peritoneal signs. Negative murphy sign. Will evaluate with labs and US. Pt declines analgesics and does not appear dehydrated, will hold off on IVF.  Zofran and GI coctail ordered. Labs reviewed and grossly unremarkable. CBC appears heme concentrated with elevated RBC, hemoglobin and HCT. Non specific leukocytosis of 12.7. No anemia, no significant electrolyte derangements. LFTs, renal function, and lipase WNL. Urinalysis without obvious infection.  Abdominal US shows normal gallbladder but does have signs of hepatic steatosis.    On repeat abdominal exam patient remains without peritoneal signs, doubt cholecystitis, pancreatitis, diverticulitis, appendicitis, bowel obstruction/perforation, PID, or ectopic pregnancy. Patient tolerating PO in the emergency department. Will discharge home with supportive measures. She does have history of Hepatitis B positive antibody and used to see GI in [place] but was lost to follow up after moving to [place]. Recommend she follow up with pcp given fatty liver on US. I discussed results, treatment plan, need for PCP follow-up, and return precautions with the patient. Provided opportunity for questions, patient confirmed understanding and is in agreement with plan. Findings and plan of care discussed with supervising  physician Dr. Renaye Rakersrifan.   Portions of this note were generated with Scientist, clinical (histocompatibility and immunogenetics)Dragon dictation software. Dictation errors may occur  despite best attempts at proofreading.   Final Clinical Impressions(s) / ED Diagnoses   Final diagnoses:  RUQ pain    ED Discharge Orders         Ordered    ibuprofen (ADVIL) 800 MG tablet  3 times daily PRN     09/03/19 1320    ondansetron (ZOFRAN) 4 MG tablet  Every 6 hours PRN     09/03/19 1320           Cherre Robins, PA-C 09/03/19 1324    Wyvonnia Dusky, MD 09/03/19 1944

## 2019-09-03 NOTE — ED Notes (Signed)
Pt provided with Co[employer]

## 2019-09-03 NOTE — ED Triage Notes (Signed)
Per pt, states while at work she had sharp RUQ pain and vomited once-her employer wanted her to come get checked out

## 2019-09-03 NOTE — Discharge Instructions (Addendum)
There are many causes of abdominal pain. Most pain is not serious and goes away, but some pain gets worse, changes, or will not go away. Please return to the emergency department or see your doctor right away if you (or your family member) experience any of the following:   Pain that gets worse or moves to just one spot.  Pain that gets worse if you cough or sneeze.  Pain with going over a bump in the road.  Pain that does not get better in 24 hours.  Inability to keep down liquids (vomiting)--especially if you are making less urine.  Fainting.  Blood in the vomit or stool.  High fever or shaking chills.  Swelling of the abdomen.  Any new or worsening problem.   Follow-up Instructions  Follow up with primary care provider in 2-5 days for symptom recheck.    Additional Instructions  No alcohol.  No caffeine, aspirin, or cigarettes.

## 2019-09-03 NOTE — ED Notes (Signed)
US at bedside

## 2019-09-09 ENCOUNTER — Emergency Department (HOSPITAL_COMMUNITY)
Admission: EM | Admit: 2019-09-09 | Discharge: 2019-09-10 | Disposition: A | Discharge status: Home / Self Care | Payer: Medicaid Other | Attending: Emergency Medicine | Admitting: Emergency Medicine

## 2019-09-09 ENCOUNTER — Ambulatory Visit
Admission: RE | Admit: 2019-09-09 | Discharge: 2019-09-09 | Discharge status: Absent without leave | Payer: Medicaid Other | Source: Ambulatory Visit

## 2019-09-09 ENCOUNTER — Encounter (HOSPITAL_COMMUNITY): Payer: Self-pay | Admitting: Emergency Medicine

## 2019-09-09 ENCOUNTER — Other Ambulatory Visit: Payer: Self-pay

## 2019-09-09 ENCOUNTER — Inpatient Hospital Stay: Admit: 2019-09-09 | Discharge: 2019-09-09 | Disposition: A | Discharge status: Home / Self Care | Payer: Medicaid Other

## 2019-09-09 DIAGNOSIS — Z5321 Procedure and treatment not carried out due to patient leaving prior to being seen by health care provider: Secondary | ICD-10-CM | POA: Insufficient documentation

## 2019-09-09 DIAGNOSIS — R109 Unspecified abdominal pain: Secondary | ICD-10-CM | POA: Diagnosis present

## 2019-09-09 LAB — CBC
HCT: 44.2 % (ref 36.0–46.0)
Hemoglobin: 14.9 g/dL (ref 12.0–15.0)
MCH: 30.2 pg (ref 26.0–34.0)
MCHC: 33.7 g/dL (ref 30.0–36.0)
MCV: 89.7 fL (ref 80.0–100.0)
Platelets: 330 10*3/uL (ref 150–400)
RBC: 4.93 MIL/uL (ref 3.87–5.11)
RDW: 13.1 % (ref 11.5–15.5)
WBC: 12.6 10*3/uL — ABNORMAL HIGH (ref 4.0–10.5)
nRBC: 0 % (ref 0.0–0.2)

## 2019-09-09 LAB — COMPREHENSIVE METABOLIC PANEL
ALT: 17 U/L (ref 0–44)
AST: 19 U/L (ref 15–41)
Albumin: 4 g/dL (ref 3.5–5.0)
Alkaline Phosphatase: 87 U/L (ref 38–126)
Anion gap: 8 (ref 5–15)
BUN: 10 mg/dL (ref 6–20)
CO2: 27 mmol/L (ref 22–32)
Calcium: 9.4 mg/dL (ref 8.9–10.3)
Chloride: 105 mmol/L (ref 98–111)
Creatinine, Ser: 0.9 mg/dL (ref 0.44–1.00)
GFR calc Af Amer: >60 mL/min (ref >60)
GFR calc non Af Amer: >60 mL/min (ref >60)
Glucose, Bld: 96 mg/dL (ref 70–99)
Potassium: 3.9 mmol/L (ref 3.5–5.1)
Sodium: 140 mmol/L (ref 135–145)
Total Bilirubin: 0.5 mg/dL (ref 0.3–1.2)
Total Protein: 6.8 g/dL (ref 6.5–8.1)

## 2019-09-09 LAB — I-STAT BETA HCG BLOOD, ED (MC, WL, AP ONLY): I-stat hCG, quantitative: <5 m[IU]/mL (ref <5)

## 2019-09-09 LAB — LIPASE, BLOOD: Lipase: 26 U/L (ref 11–51)

## 2019-09-09 MED ORDER — SODIUM CHLORIDE 0.9% FLUSH: 3.0000 mL | Freq: Once | INTRAVENOUS | Status: DC

## 2019-09-09 NOTE — ED Triage Notes (Signed)
Patient here with right lower quadrant pain.  Patient states that she was seen at [facility] last Monday for same.  She was told that it was her liver, but today she thinks that it could be a cyst on her ovary.  Patient does have some nausea.

## 2019-09-10 NOTE — ED Notes (Signed)
Pt stated she has to leave to get her kid.

## 2019-10-08 IMAGING — CR DG CHEST 2V
2 series · 2 of 2 positions shown · non-contrast
Comparison: 09/19/2018.

CLINICAL DATA: Chest pain, cough and shortness of breath with fever
since yesterday.

EXAM:
CHEST - 2 VIEW

[w chest pa]
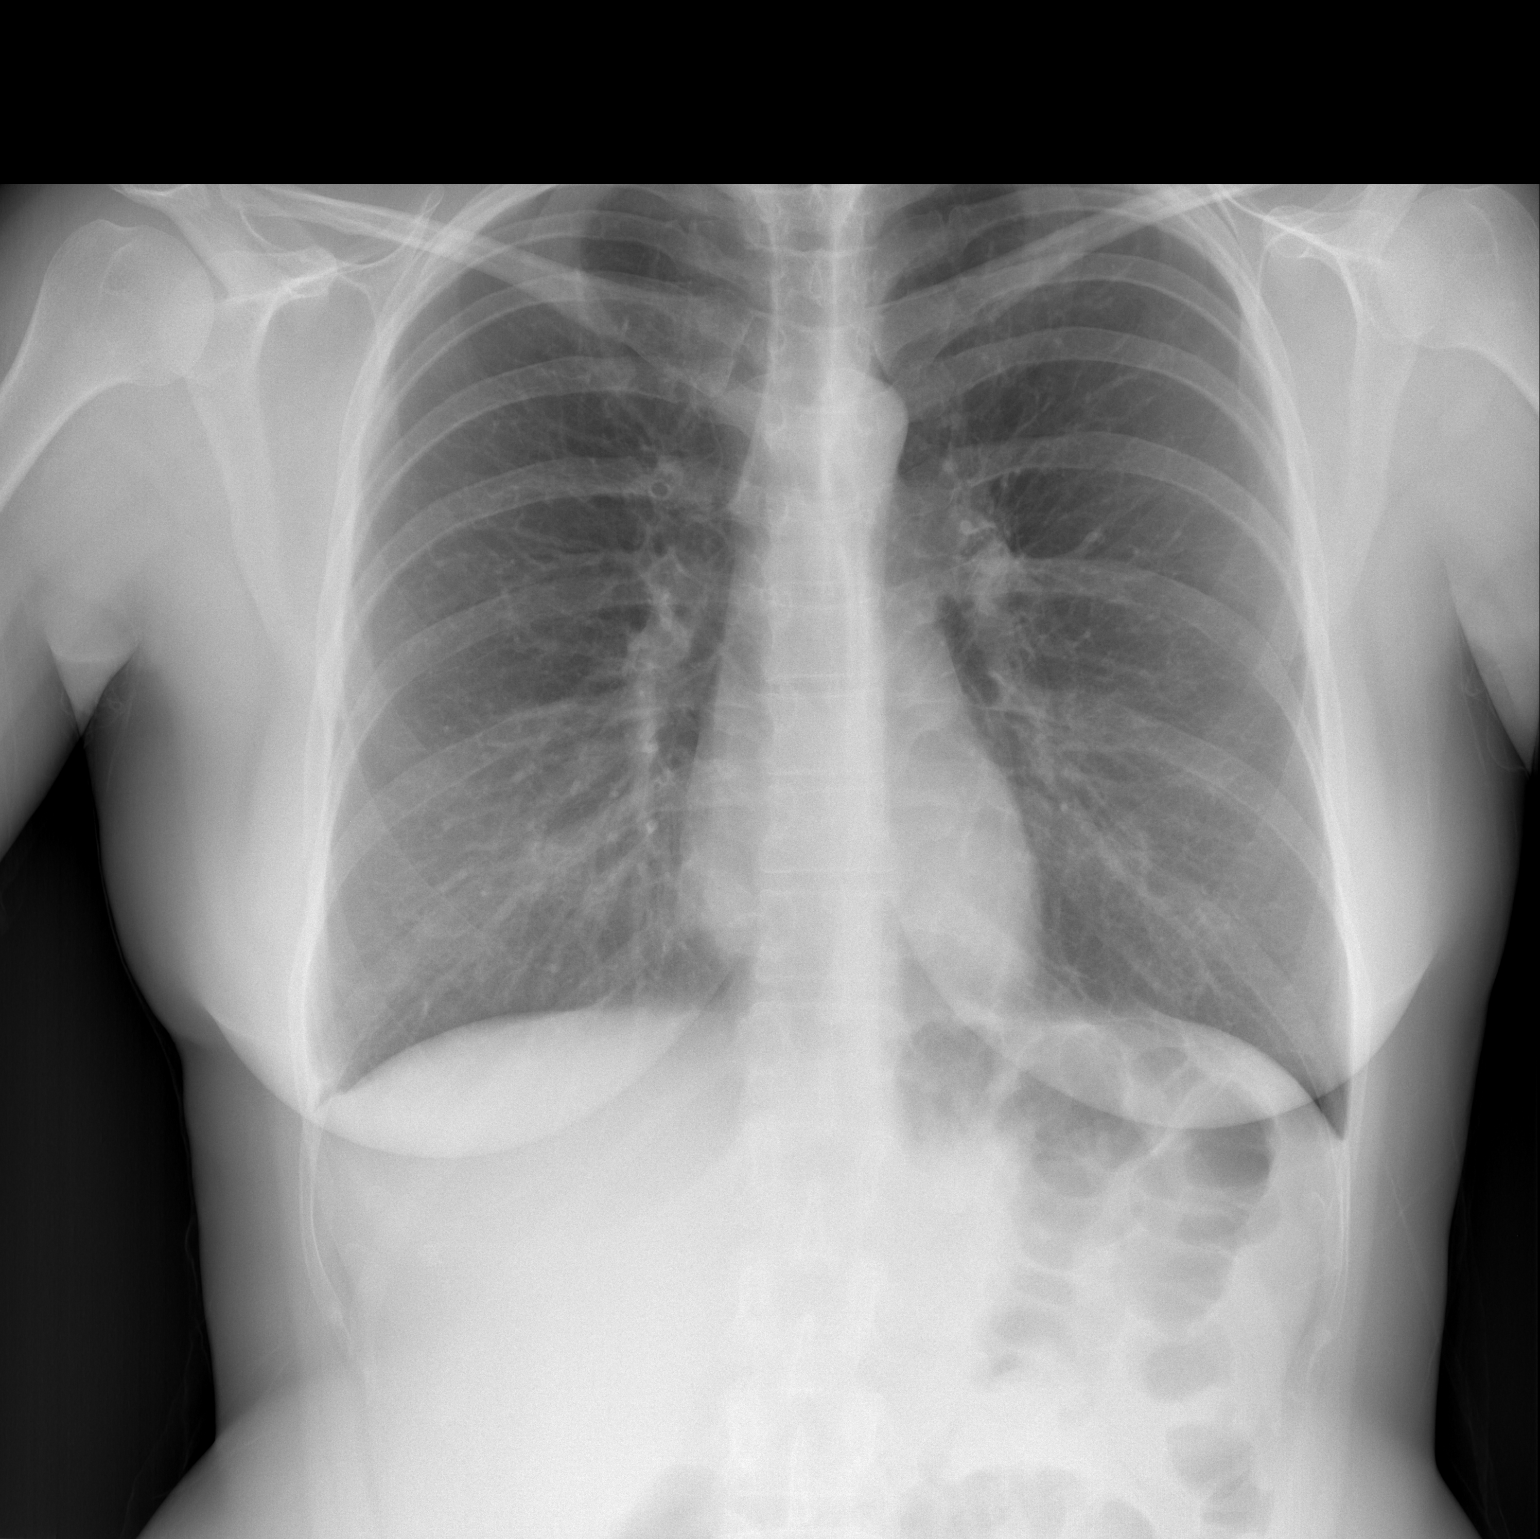

[w chest lat]
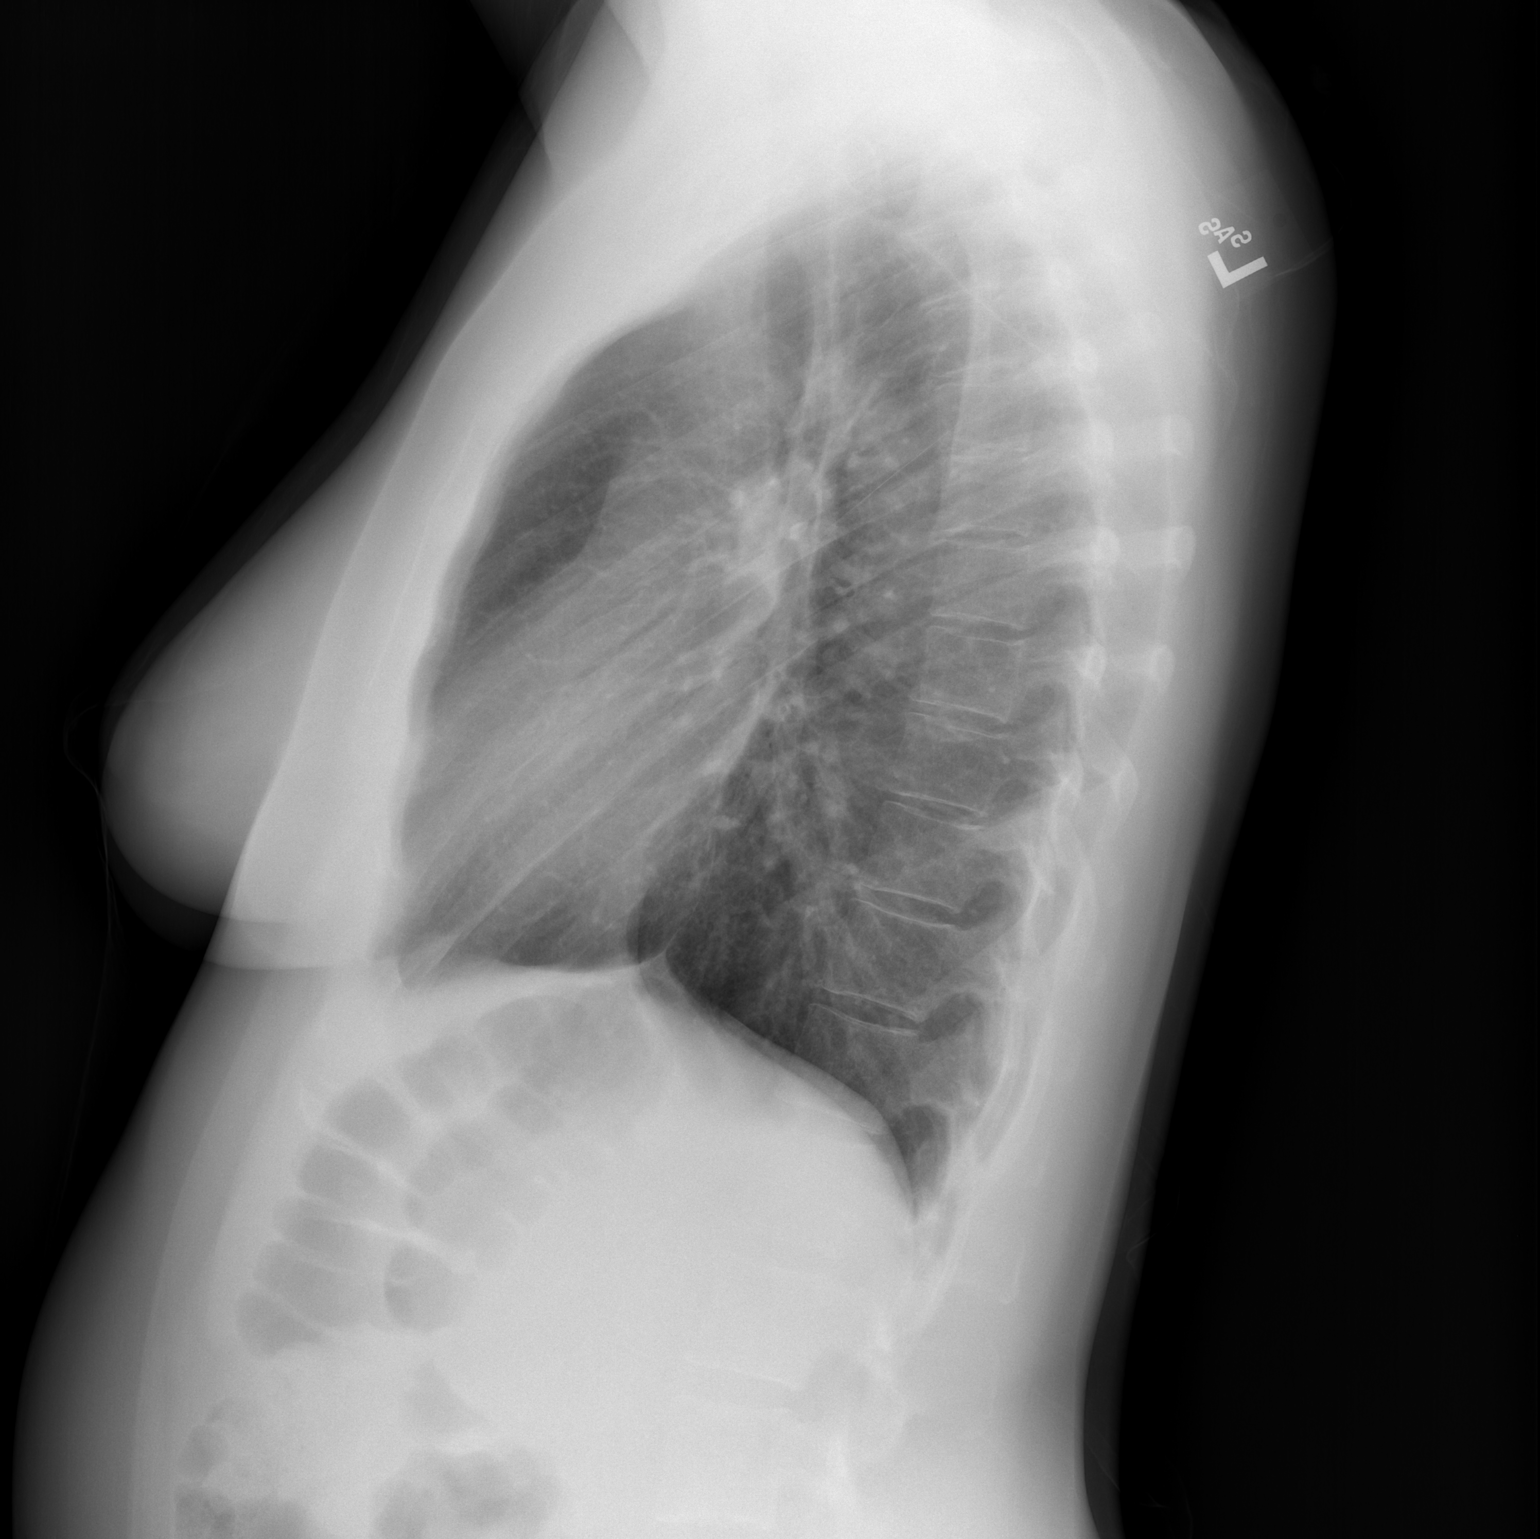

[2 of 2 positions shown; findings below may reference images not displayed]

FINDINGS: Normal sized heart. Clear lungs. Stable mild peribronchial
thickening. Normal appearing bones.
IMPRESSION: No acute disease. Stable mild bronchitic changes.

## 2020-01-07 ENCOUNTER — Other Ambulatory Visit: Payer: Self-pay

## 2020-01-07 ENCOUNTER — Inpatient Hospital Stay (HOSPITAL_COMMUNITY): Payer: Medicaid Other

## 2020-01-07 ENCOUNTER — Encounter (HOSPITAL_COMMUNITY): Payer: Self-pay | Admitting: Family Medicine

## 2020-01-07 ENCOUNTER — Inpatient Hospital Stay (HOSPITAL_COMMUNITY)
Admission: AD | Admit: 2020-01-07 | Discharge: 2020-01-07 | Disposition: A | Discharge status: Home / Self Care | Payer: Medicaid Other | Attending: Family Medicine | Admitting: Family Medicine

## 2020-01-07 DIAGNOSIS — R109 Unspecified abdominal pain: Secondary | ICD-10-CM | POA: Diagnosis not present

## 2020-01-07 DIAGNOSIS — R102 Pelvic and perineal pain: Secondary | ICD-10-CM | POA: Diagnosis not present

## 2020-01-07 DIAGNOSIS — O26851 Spotting complicating pregnancy, first trimester: Secondary | ICD-10-CM | POA: Diagnosis not present

## 2020-01-07 DIAGNOSIS — Z3491 Encounter for supervision of normal pregnancy, unspecified, first trimester: Secondary | ICD-10-CM

## 2020-01-07 DIAGNOSIS — Z881 Allergy status to other antibiotic agents status: Secondary | ICD-10-CM | POA: Insufficient documentation

## 2020-01-07 DIAGNOSIS — Z888 Allergy status to other drugs, medicaments and biological substances status: Secondary | ICD-10-CM | POA: Diagnosis not present

## 2020-01-07 DIAGNOSIS — Z3A01 Less than 8 weeks gestation of pregnancy: Secondary | ICD-10-CM | POA: Insufficient documentation

## 2020-01-07 DIAGNOSIS — Z885 Allergy status to narcotic agent status: Secondary | ICD-10-CM | POA: Diagnosis not present

## 2020-01-07 DIAGNOSIS — O26891 Other specified pregnancy related conditions, first trimester: Secondary | ICD-10-CM | POA: Insufficient documentation

## 2020-01-07 DIAGNOSIS — Z79899 Other long term (current) drug therapy: Secondary | ICD-10-CM | POA: Diagnosis not present

## 2020-01-07 DIAGNOSIS — Z87891 Personal history of nicotine dependence: Secondary | ICD-10-CM | POA: Insufficient documentation

## 2020-01-07 DIAGNOSIS — Z6791 Unspecified blood type, Rh negative: Secondary | ICD-10-CM

## 2020-01-07 DIAGNOSIS — O26899 Other specified pregnancy related conditions, unspecified trimester: Secondary | ICD-10-CM

## 2020-01-07 DIAGNOSIS — Z880 Allergy status to penicillin: Secondary | ICD-10-CM | POA: Insufficient documentation

## 2020-01-07 LAB — CBC
HCT: 41.6 % (ref 36.0–46.0)
Hemoglobin: 14 g/dL (ref 12.0–15.0)
MCH: 30.2 pg (ref 26.0–34.0)
MCHC: 33.7 g/dL (ref 30.0–36.0)
MCV: 89.7 fL (ref 80.0–100.0)
Platelets: 293 10*3/uL (ref 150–400)
RBC: 4.64 MIL/uL (ref 3.87–5.11)
RDW: 13.3 % (ref 11.5–15.5)
WBC: 13.3 10*3/uL — ABNORMAL HIGH (ref 4.0–10.5)
nRBC: 0 % (ref 0.0–0.2)

## 2020-01-07 LAB — URINALYSIS, ROUTINE W REFLEX MICROSCOPIC
Bacteria, UA: NONE SEEN
Bilirubin Urine: NEGATIVE
Glucose, UA: NEGATIVE mg/dL
Hgb urine dipstick: NEGATIVE
Ketones, ur: NEGATIVE mg/dL
Leukocytes,Ua: NEGATIVE
Nitrite: NEGATIVE
Protein, ur: 30 mg/dL — AB
Specific Gravity, Urine: 1.03 (ref 1.005–1.030)
pH: 5 (ref 5.0–8.0)

## 2020-01-07 LAB — WET PREP, GENITAL
Clue Cells Wet Prep HPF POC: NONE SEEN
Sperm: NONE SEEN
Trich, Wet Prep: NONE SEEN
Yeast Wet Prep HPF POC: NONE SEEN

## 2020-01-07 LAB — HCG, QUANTITATIVE, PREGNANCY: hCG, Beta Chain, Quant, S: 61291 m[IU]/mL — ABNORMAL HIGH (ref <5)

## 2020-01-07 LAB — POCT PREGNANCY, URINE: Preg Test, Ur: POSITIVE — AB

## 2020-01-07 MED ORDER — RHO D IMMUNE GLOBULIN 1500 UNIT/2ML IJ SOSY
300.0000 ug | PREFILLED_SYRINGE | Freq: Once | INTRAMUSCULAR | Status: AC
  Administered 2020-01-07: 300 ug via INTRAMUSCULAR
  Filled 2020-01-07: qty 2

## 2020-01-07 NOTE — MAU Note (Signed)
Pt reports she found out she was pregnant a few days ago. Pt reports some sharp, right lower quadrant pain that started yesterday. Pain is always there for most part. Pt states she took ibuprofen earlier which helped some. Rates pain 7/10. Pt reports some very light pink spotting that started today. LMP: 11/25/2019.

## 2020-01-07 NOTE — MAU Provider Note (Signed)
History     CSN: [id number]  Arrival date and time: 01/07/20 0973   First Provider Initiated Contact with Patient 01/07/20 2017      Chief Complaint  Patient presents with  . Pelvic Pain  . Vaginal Bleeding   34 y.o. [id number] @[redacted]w[redacted]d  presenting with abdominal cramping and spotting. Pain started yesterday. Located low abdomen and bilateral and central. Rates 7/10. Has not taken anything for it. Spotting started today. Describes as pink when she wipes. No recent sex.   OB History    Gravida  4   Para  2   Term  2   Preterm      AB  1   Living  2     SAB  1   TAB      Ectopic      Multiple  0   Live Births  2        Obstetric Comments  Short cervix w/ 2019 pregnancy        Past Medical History:  Diagnosis Date  . Hepatitis B antibody positive   . Vaginal Pap smear, abnormal     Past Surgical History:  Procedure Laterality Date  . CERVICAL BIOPSY  W/ LOOP ELECTRODE EXCISION    . extraction of wisdom teeth      Family History  Adopted: Yes    Social History   Tobacco Use  . Smoking status: Former Smoker    Packs/day: 0.25  . Smokeless tobacco: Never Used  . Tobacco comment: States she quit in 2020  Substance Use Topics  . Alcohol use: Yes    Comment: socially  . Drug use: No    Allergies:  Allergies  Allergen Reactions  . Beef-Derived Products Nausea And Vomiting and Other (See Comments)    Actively vomits. Alpha-gal allergy.    2021 Ultram [Tramadol] Other (See Comments)    Hallucinate and heart stop  . Allegra [Fexofenadine] Palpitations and Other (See Comments)    hyperactive  . Augmentin [Amoxicillin-Pot Clavulanate] Other (See Comments)    Childhood, pt states she can take penicillin Has patient had a PCN reaction causing immediate rash, facial/tongue/throat swelling, SOB or lightheadedness with hypotension: No Has patient had a PCN reaction causing severe rash involving mucus membranes or skin necrosis: No Has patient had a PCN  reaction that required hospitalization No Has patient had a PCN reaction occurring within the last 10 years: No If all of the above answers are "NO", then may proceed with Cephalosporin use.   . Benadryl [Diphenhydramine] Hives and Palpitations  . Naldecon Senior [Guaifenesin] Nausea And Vomiting and Other (See Comments)    Hives   . Sudafed [Pseudoephedrine] Palpitations and Other (See Comments)    hyperactivity   . Zyrtec [Cetirizine] Palpitations and Other (See Comments)    Hyperactive     Facility-Administered Medications Prior to Admission  Medication Dose Route Frequency Provider Last Rate Last Admin  . medroxyPROGESTERone (DEPO-PROVERA) injection 150 mg  150 mg Intramuscular Q90 days Anyanwu, Ugonna A, MD   150 mg at 03/14/18 1316   Medications Prior to Admission  Medication Sig Dispense Refill Last Dose  . amoxicillin (AMOXIL) 500 MG capsule Take 1 capsule (500 mg total) by mouth 3 (three) times daily. 21 capsule 0   . benzonatate (TESSALON) 100 MG capsule Take 1 capsule (100 mg total) by mouth every 8 (eight) hours. 21 capsule 0   . fluticasone (FLONASE) 50 MCG/ACT nasal spray Place 1 spray into both nostrils daily. 16 g  0   . guaiFENesin (ROBITUSSIN) 100 MG/5ML liquid Take 5-10 mLs (100-200 mg total) by mouth 3 (three) times daily as needed for cough. 60 mL 0   . ibuprofen (ADVIL) 800 MG tablet Take 1 tablet (800 mg total) by mouth 3 (three) times daily as needed. 21 tablet 0   . medroxyPROGESTERone (DEPO-PROVERA) 150 MG/ML injection Inject 1 mL (150 mg total) into the muscle every 3 (three) months. 1 mL 3   . Multiple Vitamin (MULTIVITAMIN WITH MINERALS) TABS tablet Take 1 tablet by mouth daily.     . naproxen (NAPROSYN) 500 MG tablet Take 1 tablet (500 mg total) by mouth 2 (two) times daily. 10 tablet 0   . ondansetron (ZOFRAN ODT) 4 MG disintegrating tablet Take 1 tablet (4 mg total) by mouth every 8 (eight) hours as needed for nausea or vomiting. 6 tablet 0   . ondansetron  (ZOFRAN) 4 MG tablet Take 1 tablet (4 mg total) by mouth every 6 (six) hours as needed for nausea or vomiting. 8 tablet 0     Review of Systems  Constitutional: Negative for chills and fever.  Gastrointestinal: Positive for abdominal pain and nausea. Negative for constipation, diarrhea and vomiting.  Genitourinary: Negative for dysuria, frequency, hematuria, vaginal bleeding and vaginal discharge.   Physical Exam   Blood pressure 130/80, pulse 86, temperature 98.5 F (36.9 C), temperature source Oral, resp. rate 18, height 5\' 3"  (1.6 m), weight 80.6 kg, last menstrual period 11/25/2019, SpO2 97 %.  Physical Exam  Nursing note and vitals reviewed. Constitutional: She is oriented to person, place, and time. She appears well-developed and well-nourished. No distress.  HENT:  Head: Normocephalic and atraumatic.  Cardiovascular: Normal rate.  Respiratory: Effort normal. No respiratory distress.  GI: Soft. She exhibits no distension and no mass. There is no abdominal tenderness. There is no rebound and no guarding.  Genitourinary:    Genitourinary Comments: External: no lesions or erythema Vagina: rugated, pink, moist, thin white discharge, no blood Uterus: non enlarged, anteverted, non tender, no CMT Adnexae: no masses, no tenderness left, no tenderness right Cervix closed/long    Musculoskeletal:        General: Normal range of motion.     Cervical back: Normal range of motion.  Neurological: She is alert and oriented to person, place, and time.  Skin: Skin is warm and dry.  Psychiatric: She has a normal mood and affect.   Results for orders placed or performed during the hospital encounter of 01/07/20 (from the past 24 hour(s))  Urinalysis, Routine w reflex microscopic     Status: Abnormal   Collection Time: 01/07/20  8:03 PM  Result Value Ref Range   Color, Urine YELLOW YELLOW   APPearance HAZY (A) CLEAR   Specific Gravity, Urine 1.030 1.005 - 1.030   pH 5.0 5.0 - 8.0    Glucose, UA NEGATIVE NEGATIVE mg/dL   Hgb urine dipstick NEGATIVE NEGATIVE   Bilirubin Urine NEGATIVE NEGATIVE   Ketones, ur NEGATIVE NEGATIVE mg/dL   Protein, ur 30 (A) NEGATIVE mg/dL   Nitrite NEGATIVE NEGATIVE   Leukocytes,Ua NEGATIVE NEGATIVE   RBC / HPF 0-5 0 - 5 RBC/hpf   WBC, UA 0-5 0 - 5 WBC/hpf   Bacteria, UA NONE SEEN NONE SEEN   Squamous Epithelial / LPF 6-10 0 - 5   Mucus PRESENT    Ca Oxalate Crys, UA PRESENT   Pregnancy, urine POC     Status: Abnormal   Collection Time: 01/07/20  8:03  PM  Result Value Ref Range   Preg Test, Ur POSITIVE (A) NEGATIVE  Wet prep, genital     Status: Abnormal   Collection Time: 01/07/20  8:24 PM  Result Value Ref Range   Yeast Wet Prep HPF POC NONE SEEN NONE SEEN   Trich, Wet Prep NONE SEEN NONE SEEN   Clue Cells Wet Prep HPF POC NONE SEEN NONE SEEN   WBC, Wet Prep HPF POC MODERATE (A) NONE SEEN   Sperm NONE SEEN   CBC     Status: Abnormal   Collection Time: 01/07/20  8:35 PM  Result Value Ref Range   WBC 13.3 (H) 4.0 - 10.5 K/uL   RBC 4.64 3.87 - 5.11 MIL/uL   Hemoglobin 14.0 12.0 - 15.0 g/dL   HCT 54.2 70.6 - 23.7 %   MCV 89.7 80.0 - 100.0 fL   MCH 30.2 26.0 - 34.0 pg   MCHC 33.7 30.0 - 36.0 g/dL   RDW 62.8 31.5 - 17.6 %   Platelets 293 150 - 400 K/uL   nRBC 0.0 0.0 - 0.2 %  Rh IG workup (includes ABO/Rh)     Status: None (Preliminary result)   Collection Time: 01/07/20  8:35 PM  Result Value Ref Range   Gestational Age(Wks) 6    ABO/RH(D) O NEG    Antibody Screen NEG    Unit Number [id number]    Blood Component Type RHIG    Unit division 00    Status of Unit ISSUED    Transfusion Status      OK TO TRANSFUSE Performed at [facility], [address], [place], [place] [address]    US OB LESS THAN 14 WEEKS WITH OB TRANSVAGINAL  Result Date: 01/07/2020 CLINICAL DATA:  Cramping and right lower quadrant pain EXAM: OBSTETRIC <14 WK [place] AND TRANSVAGINAL OB US TECHNIQUE: Both transabdominal and transvaginal ultrasound  examinations were performed for complete evaluation of the gestation as well as the maternal uterus, adnexal regions, and pelvic cul-de-sac. Transvaginal technique was performed to assess early pregnancy. COMPARISON:  None. FINDINGS: Intrauterine gestational sac: Single Yolk sac:  Visualized. Embryo:  Visualized. Cardiac Activity: Visualized. Heart Rate: 130 bpm CRL:  4.8 mm   6 w   1 d                  [place] EDC: 08/31/2020 Subchorionic hemorrhage:  None visualized. Maternal uterus/adnexae: The ovaries are unremarkable. There is no significant maternal abnormality detected. IMPRESSION: Single live IUP at 6 weeks and 1 day. No maternal abnormality detected. Electronically Signed   By: Katherine Mantle M.D.   On: 01/07/2020 21:02   MAU Course  Procedures  MDM Labs and [place] ordered and reviewed. Normal IUP on [place]. No evidence of UTI of acute process. GC pending. Pt reassured. Stable for discharge home.    Assessment and Plan   1. Normal intrauterine pregnancy on prenatal ultrasound in first trimester   2. Abdominal pain in pregnancy   3. Rh negative state in antepartum period   4. Spotting affecting pregnancy in first trimester    Discharge home Follow up at [facility] to start care SAB precautions Tylenol/heating pad prn  Allergies as of 01/07/2020      Reactions   Beef-derived Products Nausea And Vomiting, Other (See Comments)   Actively vomits. Alpha-gal allergy.     Ultram [tramadol] Other (See Comments)   Hallucinate and heart stop   Allegra [fexofenadine] Palpitations, Other (See Comments)   hyperactive  Augmentin [amoxicillin-pot Clavulanate] Other (See Comments)   Childhood, pt states she can take penicillin Has patient had a PCN reaction causing immediate rash, facial/tongue/throat swelling, SOB or lightheadedness with hypotension: No Has patient had a PCN reaction causing severe rash involving mucus membranes or skin necrosis: No Has patient had a PCN reaction that required  hospitalization No Has patient had a PCN reaction occurring within the last 10 years: No If all of the above answers are "NO", then may proceed with Cephalosporin use.   Benadryl [diphenhydramine] Hives, Palpitations   Naldecon Senior [guaifenesin] Nausea And Vomiting, Other (See Comments)   Hives    Sudafed [pseudoephedrine] Palpitations, Other (See Comments)   hyperactivity    Zyrtec [cetirizine] Palpitations, Other (See Comments)   Hyperactive       Medication List    STOP taking these medications   amoxicillin 500 MG capsule Commonly known as: AMOXIL   benzonatate 100 MG capsule Commonly known as: TESSALON   guaiFENesin 100 MG/5ML liquid Commonly known as: ROBITUSSIN   ibuprofen 800 MG tablet Commonly known as: ADVIL   medroxyPROGESTERone 150 MG/ML injection Commonly known as: DEPO-PROVERA   naproxen 500 MG tablet Commonly known as: NAPROSYN   ondansetron 4 MG disintegrating tablet Commonly known as: Zofran ODT   ondansetron 4 MG tablet Commonly known as: ZOFRAN     TAKE these medications   fluticasone 50 MCG/ACT nasal spray Commonly known as: FLONASE Place 1 spray into both nostrils daily.   multivitamin with minerals Tabs tablet Take 1 tablet by mouth daily.      Donette Larry, CNM 01/07/2020, 9:24 PM

## 2020-01-07 NOTE — Discharge Instructions (Signed)
Abdominal Pain During Pregnancy  Belly (abdominal) pain is common during pregnancy. There are many possible causes. Most of the time, it is not a serious problem. Other times, it can be a sign that something is wrong with the pregnancy. Always tell your doctor if you have belly pain. Follow these instructions at home:  Do not have sex or put anything in your vagina until your pain goes away completely.  Get plenty of rest until your pain gets better.  Drink enough fluid to keep your pee (urine) pale yellow.  Take over-the-counter and prescription medicines only as told by your doctor.  Keep all follow-up visits as told by your doctor. This is important. Contact a doctor if:  Your pain continues or gets worse after resting.  You have lower belly pain that: ? Comes and goes at regular times. ? Spreads to your back. ? Feels like menstrual cramps.  You have pain or burning when you pee (urinate). Get help right away if:  You have a fever or chills.  You have vaginal bleeding.  You are leaking fluid from your vagina.  You are passing tissue from your vagina.  You throw up (vomit) for more than 24 hours.  You have watery poop (diarrhea) for more than 24 hours.  Your baby is moving less than usual.  You feel very weak or faint.  You have shortness of breath.  You have very bad pain in your upper belly. Summary  Belly (abdominal) pain is common during pregnancy. There are many possible causes.  If you have belly pain during pregnancy, tell your doctor right away.  Keep all follow-up visits as told by your doctor. This is important. This information is not intended to replace advice given to you by your health care provider. Make sure you discuss any questions you have with your health care provider. Document Revised: 01/07/2019 Document Reviewed: 12/22/2016 Elsevier Patient Education  2020 Elsevier Inc. Vaginal Bleeding During Pregnancy, First Trimester  A small  amount of bleeding (spotting) from the vagina is common during early pregnancy. Sometimes the bleeding is normal and does not cause problems. At other times, though, bleeding may be a sign of something serious. Tell your doctor about any bleeding from your vagina right away. Follow these instructions at home: Activity  Follow your doctor's instructions about how active you can be.  If needed, make plans for someone to help with your normal activities.  Do not have sex or orgasms until your doctor says that this is safe. General instructions  Take over-the-counter and prescription medicines only as told by your doctor.  Watch your condition for any changes.  Write down: ? The number of pads you use each day. ? How often you change pads. ? How soaked (saturated) your pads are.  Do not use tampons.  Do not douche.  If you pass any tissue from your vagina, save it to show to your doctor.  Keep all follow-up visits as told by your doctor. This is important. Contact a doctor if:  You have vaginal bleeding at any time while you are pregnant.  You have cramps.  You have a fever. Get help right away if:  You have very bad cramps in your back or belly (abdomen).  You pass large clots or a lot of tissue from your vagina.  Your bleeding gets worse.  You feel light-headed.  You feel weak.  You pass out (faint).  You have chills.  You are leaking fluid from your   vagina.  You have a gush of fluid from your vagina. Summary  Sometimes vaginal bleeding during pregnancy is normal and does not cause problems. At other times, bleeding may be a sign of something serious.  Tell your doctor about any bleeding from your vagina right away.  Follow your doctor's instructions about how active you can be. You may need someone to help you with your normal activities. This information is not intended to replace advice given to you by your health care provider. Make sure you discuss any  questions you have with your health care provider. Document Revised: 01/08/2019 Document Reviewed: 12/21/2016 Elsevier Patient Education  2020 Elsevier Inc.  

## 2020-01-08 LAB — RH IG WORKUP (INCLUDES ABO/RH)
ABO/RH(D): O NEG
Antibody Screen: NEGATIVE
Gestational Age(Wks): 6
Unit division: 0

## 2020-01-08 LAB — GC/CHLAMYDIA PROBE AMP (~~LOC~~) NOT AT ARMC
Chlamydia: NEGATIVE
Comment: NEGATIVE
Comment: NORMAL
Neisseria Gonorrhea: NEGATIVE

## 2020-01-11 ENCOUNTER — Other Ambulatory Visit: Payer: Self-pay

## 2020-01-11 ENCOUNTER — Encounter (HOSPITAL_COMMUNITY): Payer: Self-pay | Admitting: Obstetrics and Gynecology

## 2020-01-11 ENCOUNTER — Inpatient Hospital Stay (HOSPITAL_COMMUNITY): Payer: Medicaid Other

## 2020-01-11 ENCOUNTER — Inpatient Hospital Stay (HOSPITAL_COMMUNITY)
Admission: AD | Admit: 2020-01-11 | Discharge: 2020-01-12 | Disposition: A | Discharge status: Home / Self Care | Payer: Medicaid Other | Attending: Obstetrics and Gynecology | Admitting: Obstetrics and Gynecology

## 2020-01-11 DIAGNOSIS — N939 Abnormal uterine and vaginal bleeding, unspecified: Secondary | ICD-10-CM

## 2020-01-11 DIAGNOSIS — Z885 Allergy status to narcotic agent status: Secondary | ICD-10-CM | POA: Insufficient documentation

## 2020-01-11 DIAGNOSIS — O0380 Unspecified complication following complete or unspecified spontaneous abortion: Secondary | ICD-10-CM

## 2020-01-11 DIAGNOSIS — O036 Delayed or excessive hemorrhage following complete or unspecified spontaneous abortion: Secondary | ICD-10-CM | POA: Insufficient documentation

## 2020-01-11 DIAGNOSIS — Z880 Allergy status to penicillin: Secondary | ICD-10-CM | POA: Insufficient documentation

## 2020-01-11 DIAGNOSIS — Z881 Allergy status to other antibiotic agents status: Secondary | ICD-10-CM | POA: Insufficient documentation

## 2020-01-11 DIAGNOSIS — O046 Delayed or excessive hemorrhage following (induced) termination of pregnancy: Secondary | ICD-10-CM | POA: Diagnosis not present

## 2020-01-11 DIAGNOSIS — Z87891 Personal history of nicotine dependence: Secondary | ICD-10-CM | POA: Insufficient documentation

## 2020-01-11 DIAGNOSIS — O209 Hemorrhage in early pregnancy, unspecified: Secondary | ICD-10-CM | POA: Diagnosis present

## 2020-01-11 LAB — CBC
HCT: 36.5 % (ref 36.0–46.0)
Hemoglobin: 12.1 g/dL (ref 12.0–15.0)
MCH: 30 pg (ref 26.0–34.0)
MCHC: 33.2 g/dL (ref 30.0–36.0)
MCV: 90.3 fL (ref 80.0–100.0)
Platelets: 296 10*3/uL (ref 150–400)
RBC: 4.04 MIL/uL (ref 3.87–5.11)
RDW: 13.3 % (ref 11.5–15.5)
WBC: 14.4 10*3/uL — ABNORMAL HIGH (ref 4.0–10.5)
nRBC: 0 % (ref 0.0–0.2)

## 2020-01-11 LAB — HCG, QUANTITATIVE, PREGNANCY: hCG, Beta Chain, Quant, S: 53940 m[IU]/mL — ABNORMAL HIGH (ref <5)

## 2020-01-11 MED ORDER — KETOROLAC TROMETHAMINE 30 MG/ML IJ SOLN
30.0000 mg | Freq: Once | INTRAMUSCULAR | Status: AC
  Administered 2020-01-11: 30 mg via INTRAVENOUS
  Filled 2020-01-11: qty 1

## 2020-01-11 MED ORDER — LACTATED RINGERS IV BOLUS
1000.0000 mL | Freq: Once | INTRAVENOUS | Status: AC
  Administered 2020-01-11: 1000 mL via INTRAVENOUS

## 2020-01-11 NOTE — MAU Provider Note (Signed)
History     CSN: [id number]  Arrival date and time: 01/11/20 2142   None     Chief Complaint  Patient presents with  . Vaginal Bleeding   HPI   Ms.Christine Orozco is a 34 y.o. female [phone] @ [redacted]w[redacted]d here with vaginal bleeding. She was seen at [facility] choice and was prescribed cytotec for a chemical abortion. She took at 1500 today orally.  She started having heavy bleeding with large clots around 5:00, around 8:00 pm the bleeding became heavier and she blacked out on the toilet. She is changing her pad every 30 mins. She attests to abdominal cramping. She took ibuprofen around 4:00, which helped only some.  She denies dizziness now.  OB History    Gravida  4   Para  2   Term  2   Preterm      AB  1   Living  2     SAB  1   TAB      Ectopic      Multiple  0   Live Births  2        Obstetric Comments  Short cervix w/ 2019 pregnancy        Past Medical History:  Diagnosis Date  . Hepatitis B antibody positive   . Vaginal Pap smear, abnormal     Past Surgical History:  Procedure Laterality Date  . CERVICAL BIOPSY  W/ LOOP ELECTRODE EXCISION    . extraction of wisdom teeth      Family History  Adopted: Yes    Social History   Tobacco Use  . Smoking status: Former Smoker    Packs/day: 0.25  . Smokeless tobacco: Never Used  . Tobacco comment: States she quit in 2020  Substance Use Topics  . Alcohol use: Yes    Comment: socially  . Drug use: No    Allergies:  Allergies  Allergen Reactions  . Beef-Derived Products Nausea And Vomiting and Other (See Comments)    Actively vomits. Alpha-gal allergy.    Marland Kitchen Ultram [Tramadol] Other (See Comments)    Hallucinate and heart stop  . Allegra [Fexofenadine] Palpitations and Other (See Comments)    hyperactive  . Augmentin [Amoxicillin-Pot Clavulanate] Other (See Comments)    Childhood, pt states she can take penicillin Has patient had a PCN reaction causing immediate rash, facial/tongue/throat  swelling, SOB or lightheadedness with hypotension: No Has patient had a PCN reaction causing severe rash involving mucus membranes or skin necrosis: No Has patient had a PCN reaction that required hospitalization No Has patient had a PCN reaction occurring within the last 10 years: No If all of the above answers are "NO", then may proceed with Cephalosporin use.   . Benadryl [Diphenhydramine] Hives and Palpitations  . Naldecon Senior [Guaifenesin] Nausea And Vomiting and Other (See Comments)    Hives   . Sudafed [Pseudoephedrine] Palpitations and Other (See Comments)    hyperactivity     Medications Prior to Admission  Medication Sig Dispense Refill Last Dose  . fluticasone (FLONASE) 50 MCG/ACT nasal spray Place 1 spray into both nostrils daily. 16 g 0   . Multiple Vitamin (MULTIVITAMIN WITH MINERALS) TABS tablet Take 1 tablet by mouth daily.      Results for orders placed or performed during the hospital encounter of 01/11/20 (from the past 48 hour(s))  CBC     Status: Abnormal   Collection Time: 01/11/20 10:27 PM  Result Value Ref Range   WBC 14.4 (H)  4.0 - 10.5 K/uL   RBC 4.04 3.87 - 5.11 MIL/uL   Hemoglobin 12.1 12.0 - 15.0 g/dL   HCT 10.2 58.5 - 27.7 %   MCV 90.3 80.0 - 100.0 fL   MCH 30.0 26.0 - 34.0 pg   MCHC 33.2 30.0 - 36.0 g/dL   RDW 82.4 23.5 - 36.1 %   Platelets 296 150 - 400 K/uL   nRBC 0.0 0.0 - 0.2 %    Comment: Performed at [facility], [address], [place], [place] [address]  hCG, quantitative, pregnancy     Status: Abnormal   Collection Time: 01/11/20 10:27 PM  Result Value Ref Range   hCG, Beta Chain, Quant, S 53,940 (H) <5 mIU/mL    Comment:          GEST. AGE      CONC.  (mIU/mL)   <=1 WEEK        5 - 50     2 WEEKS       50 - 500     3 WEEKS       100 - 10,000     4 WEEKS     1,000 - 30,000     5 WEEKS     3,500 - 115,000   6-8 WEEKS     12,000 - 270,000    12 WEEKS     15,000 - 220,000        FEMALE AND NON-PREGNANT FEMALE:     LESS THAN  5 mIU/mL Performed at [facility], [address], [place], [place] [address]    [place] Transvaginal  Result Date: 01/11/2020 CLINICAL DATA:  33 year old female status post abortion with vaginal bleeding. EXAM: TRANSVAGINAL OB ULTRASOUND TECHNIQUE: Transvaginal ultrasound was performed for complete evaluation of the gestation as well as the maternal uterus, adnexal regions, and pelvic cul-de-sac. COMPARISON:  Ultrasound dated 01/07/2020. FINDINGS: The uterus is anteverted appears unremarkable. The endometrium is thickened and heterogeneous measuring 2.7 cm in thickness. The previously seen intrauterine pregnancy is no longer visualized in keeping with provided history of abortion. Heterogeneous and echogenic content within the endometrium with internal vascularity noted concerning for retained product of conception. Correlation with clinical exam and HCG levels recommended. The right ovary is unremarkable.  The left ovary is not visualized. There is a small free fluid within the pelvis. IMPRESSION: 1. Nonvisualization of the previously seen IUP in keeping with abortion. 2. Thickened and echogenic endometrium with increased vascularity concerning for retained product of conception. Clinical correlation is recommended. Electronically Signed   By: Elgie Collard M.D.   On: 01/11/2020 23:19    Review of Systems  Gastrointestinal: Positive for abdominal pain.  Genitourinary: Positive for vaginal bleeding.  Neurological: Negative for dizziness (None now.).   Physical Exam   Blood pressure 109/86, pulse 85, resp. rate 18, last menstrual period 11/25/2019, SpO2 99 %.  Physical Exam  Constitutional: She is oriented to person, place, and time. She appears well-developed and well-nourished. No distress.  HENT:  Head: Normocephalic.  Eyes: Pupils are equal, round, and reactive to light.  Genitourinary:    Genitourinary Comments: Vagina - Small amount of vaginal bleeding.   Cervix - ? tissue/sac  noted at os, ring forceps used to remove. Small amount of active bleeding from os following.  Bimanual exam: Cervix FT Chaperone present for exam.    Musculoskeletal:        General: Normal range of motion.  Neurological: She is alert and  oriented to person, place, and time.  Skin: Skin is warm. She is not diaphoretic.  Psychiatric: Her behavior is normal.   MAU Course  Procedures  None  MDM  O negative blood type: Rhogam given 01/07/20 POC sent for pathology  CBC stable Toradol given IM: Pain now: 0/10 Patient up to the bathroom without dizziness, states she feels signifcantly better. Hgb stable  LR bolus X 1  Assessment and Plan   A:  1. Episode of heavy vaginal bleeding   2. Post-abortion complication      P:  Discharge home in stable condition  States the only time she can come into the office is Friday morning as she will have childcare. Stat Quant needed so we have results prior to wkd.  Appointment made for her to go to [facility] 4/16 @ 10:00 Would consider second dose of cytotec if quant does not drop appropriately.  Return to MAU if symptoms worsen Bleeding precautions Patient has ibuprofen at home; encouraged her to use this as directed on the bottle  Rasch, Anderson Malta I, NP 01/12/2020 12:53 AM

## 2020-01-11 NOTE — MAU Note (Signed)
EM arrival. Pt took misoprostol for chemical abortion around 3 pm today. Started having heavy bleeding around 6. Stated she  "blacked out on the toilet" got hot  and sweaty and face went numb. Stated she has changed her pads about every 30 min. Not sure if any tissue has come out. V/SS at this time pt A/A/ Ox4. moderate amount of bright red bleeding noted on pad with several small stringy clots. No active bleeding observed at his time

## 2020-01-14 LAB — SURGICAL PATHOLOGY

## 2020-01-17 ENCOUNTER — Ambulatory Visit: Payer: Medicaid Other

## 2020-02-18 ENCOUNTER — Other Ambulatory Visit: Payer: Self-pay

## 2020-02-18 ENCOUNTER — Encounter (HOSPITAL_COMMUNITY): Payer: Self-pay

## 2020-02-18 ENCOUNTER — Emergency Department (HOSPITAL_COMMUNITY): Payer: Medicaid Other

## 2020-02-18 ENCOUNTER — Emergency Department (HOSPITAL_COMMUNITY)
Admission: EM | Admit: 2020-02-18 | Discharge: 2020-02-18 | Disposition: A | Discharge status: Home / Self Care | Payer: Medicaid Other | Attending: Emergency Medicine | Admitting: Emergency Medicine

## 2020-02-18 DIAGNOSIS — X500XXA Overexertion from strenuous movement or load, initial encounter: Secondary | ICD-10-CM | POA: Diagnosis not present

## 2020-02-18 DIAGNOSIS — Y999 Unspecified external cause status: Secondary | ICD-10-CM | POA: Diagnosis not present

## 2020-02-18 DIAGNOSIS — Y929 Unspecified place or not applicable: Secondary | ICD-10-CM | POA: Diagnosis not present

## 2020-02-18 DIAGNOSIS — M25511 Pain in right shoulder: Secondary | ICD-10-CM | POA: Diagnosis not present

## 2020-02-18 DIAGNOSIS — T148XXA Other injury of unspecified body region, initial encounter: Secondary | ICD-10-CM

## 2020-02-18 DIAGNOSIS — S4990XA Unspecified injury of shoulder and upper arm, unspecified arm, initial encounter: Secondary | ICD-10-CM

## 2020-02-18 DIAGNOSIS — Y9389 Activity, other specified: Secondary | ICD-10-CM | POA: Insufficient documentation

## 2020-02-18 DIAGNOSIS — S4991XA Unspecified injury of right shoulder and upper arm, initial encounter: Secondary | ICD-10-CM | POA: Diagnosis not present

## 2020-02-18 HISTORY — DX: Complete or unspecified spontaneous abortion without complication: O03.9

## 2020-02-18 NOTE — ED Provider Notes (Signed)
[facility] DEPT Provider Note   CSN: [id number] Arrival date & time: 02/18/20  1722     History Chief Complaint  Patient presents with  . Shoulder Pain    Christine Orozco is a 34 y.o. female.  34 year old female presents with right shoulder pain after she was lifting a box of ice cream.  Complains of pain to her right trapezius muscle.  Pain characterizes sharp and worse with movement.  Has used ibuprofen without relief.  Denies any distal numbness or tingling to her right hand        Past Medical History:  Diagnosis Date  . Hepatitis B antibody positive   . Miscarriage   . Vaginal Pap smear, abnormal     There are no problems to display for this patient.   Past Surgical History:  Procedure Laterality Date  . CERVICAL BIOPSY  W/ LOOP ELECTRODE EXCISION    . extraction of wisdom teeth       OB History    Gravida  4   Para  2   Term  2   Preterm      AB  1   Living  2     SAB  1   TAB      Ectopic      Multiple  0   Live Births  2        Obstetric Comments  Short cervix w/ 2019 pregnancy        Family History  Adopted: Yes    Social History   Tobacco Use  . Smoking status: Former Smoker    Packs/day: 0.25  . Smokeless tobacco: Never Used  . Tobacco comment: States she quit in 2020  Substance Use Topics  . Alcohol use: Yes    Comment: socially  . Drug use: No    Home Medications Prior to Admission medications   Medication Sig Start Date End Date Taking? Authorizing Provider  fluticasone (FLONASE) 50 MCG/ACT nasal spray Place 1 spray into both nostrils daily. 12/05/18   Petrucelli, Samantha R, PA-C  Multiple Vitamin (MULTIVITAMIN WITH MINERALS) TABS tablet Take 1 tablet by mouth daily.    [provider]    Allergies    Beef-derived products, Ultram [tramadol], Allegra [fexofenadine], Augmentin [amoxicillin-pot clavulanate], Benadryl [diphenhydramine], Naldecon senior [guaifenesin], and  Sudafed [pseudoephedrine]  Review of Systems   Review of Systems  All other systems reviewed and are negative.   Physical Exam Updated Vital Signs BP (!) 128/92   Pulse 82   Temp 98.1 F (36.7 C) (Oral)   Resp 16   Ht 1.6 m (5\' 3" )   Wt 80.3 kg   LMP 02/16/2020 (Approximate)   SpO2 100%   Breastfeeding Unknown   BMI 31.35 kg/m   Physical Exam Vitals and nursing note reviewed.  Constitutional:      General: She is not in acute distress.    Appearance: Normal appearance. She is well-developed. She is not toxic-appearing.  HENT:     Head: Normocephalic and atraumatic.  Eyes:     General: Lids are normal.     Conjunctiva/sclera: Conjunctivae normal.     Pupils: Pupils are equal, round, and reactive to light.  Neck:     Thyroid: No thyroid mass.     Trachea: No tracheal deviation.  Cardiovascular:     Rate and Rhythm: Normal rate and regular rhythm.     Heart sounds: Normal heart sounds. No murmur. No gallop.   Pulmonary:  Effort: Pulmonary effort is normal. No respiratory distress.     Breath sounds: Normal breath sounds. No stridor. No decreased breath sounds, wheezing, rhonchi or rales.  Abdominal:     General: Bowel sounds are normal. There is no distension.     Palpations: Abdomen is soft.     Tenderness: There is no abdominal tenderness. There is no rebound.  Musculoskeletal:        General: No tenderness. Normal range of motion.     Cervical back: Normal range of motion and neck supple.       Back:  Skin:    General: Skin is warm and dry.     Findings: No abrasion or rash.  Neurological:     Mental Status: She is alert and oriented to person, place, and time.     GCS: GCS eye subscore is 4. GCS verbal subscore is 5. GCS motor subscore is 6.     Cranial Nerves: No cranial nerve deficit.     Sensory: No sensory deficit.  Psychiatric:        Speech: Speech normal.        Behavior: Behavior normal.     ED Results / Procedures / Treatments    Labs (all labs ordered are listed, but only abnormal results are displayed) Labs Reviewed - No data to display  EKG None  Radiology DG Shoulder Right  Result Date: 02/18/2020 CLINICAL DATA:  34 year old female with right shoulder pain after heavy lifting at work. EXAM: RIGHT SHOULDER - 2+ VIEW COMPARISON:  Chest radiographs 12/05/2018. FINDINGS: Bone mineralization is within normal limits. There is no evidence of fracture or dislocation. There is no evidence of arthropathy or other focal bone abnormality. Visible right ribs and chest appear stable and negative. IMPRESSION: Negative. Electronically Signed   By: Odessa Fleming M.D.   On: 02/18/2020 19:23    Procedures Procedures (including critical care time)  Medications Ordered in ED Medications - No data to display  ED Course  I have reviewed the triage vital signs and the nursing notes.  Pertinent labs & imaging results that were available during my care of the patient were reviewed by me and considered in my medical decision making (see chart for details).    MDM Rules/Calculators/A&P                      Right shoulder negative for fracture.  Patient with MSK pain.  Will discharge with return precautions Final Clinical Impression(s) / ED Diagnoses Final diagnoses:  Shoulder injury    Rx / DC Orders ED Discharge Orders    None       Lorre Nick, MD 02/18/20 1948

## 2020-02-18 NOTE — Discharge Instructions (Addendum)
Use Tylenol and Motrin as directed ?

## 2020-02-18 NOTE — ED Triage Notes (Signed)
Patient c/o right shoulder pain. Patient states she was lifting a heavy object at work and is now having right shoulder pain.

## 2020-03-06 ENCOUNTER — Encounter (HOSPITAL_COMMUNITY): Payer: Self-pay

## 2020-03-06 ENCOUNTER — Other Ambulatory Visit: Payer: Self-pay

## 2020-03-06 ENCOUNTER — Emergency Department (HOSPITAL_COMMUNITY)
Admission: EM | Admit: 2020-03-06 | Discharge: 2020-03-06 | Disposition: A | Discharge status: Home / Self Care | Payer: Medicaid Other | Attending: Emergency Medicine | Admitting: Emergency Medicine

## 2020-03-06 DIAGNOSIS — Z87891 Personal history of nicotine dependence: Secondary | ICD-10-CM | POA: Insufficient documentation

## 2020-03-06 DIAGNOSIS — Z79899 Other long term (current) drug therapy: Secondary | ICD-10-CM | POA: Insufficient documentation

## 2020-03-06 DIAGNOSIS — Y93D1 Activity, knitting and crocheting: Secondary | ICD-10-CM

## 2020-03-06 DIAGNOSIS — H9202 Otalgia, left ear: Secondary | ICD-10-CM | POA: Diagnosis present

## 2020-03-06 NOTE — ED Triage Notes (Signed)
Pt states that Monday 5/31 her son accidentally pushed a crochet hook into her left ear. Pt c/o pain to left ear, clear-bloody fluid leaking, and muffled sounds.

## 2020-03-06 NOTE — ED Provider Notes (Signed)
[facility] DEPT Provider Note   CSN: [id number] Arrival date & time: 03/06/20  1333     History Chief Complaint  Patient presents with  . Left Ear Pain    Christine Orozco is a 34 y.o. female with past medical history of hepatitis.  Who presents today for evaluation of left ear pain.  She reports that on 03/02/2020 she was crocheting and resting her hand and the hook on the side of her face when her child pushed her arm causing the crochet hook to be pushed into her left ear.  She reports her last tdap was 3 years ago.    No dizziness.  She reports that she is still having clear drainage from her left ear.  She reports decreased hearing, sounding like she is under water.   No fevers.  She reports a mild ache that is controlled with ibuprofen   HPI     Past Medical History:  Diagnosis Date  . Hepatitis B antibody positive   . Miscarriage   . Vaginal Pap smear, abnormal     There are no problems to display for this patient.   Past Surgical History:  Procedure Laterality Date  . CERVICAL BIOPSY  W/ LOOP ELECTRODE EXCISION    . extraction of wisdom teeth       OB History    Gravida  4   Para  2   Term  2   Preterm      AB  1   Living  2     SAB  1   TAB      Ectopic      Multiple  0   Live Births  2        Obstetric Comments  Short cervix w/ 2019 pregnancy        Family History  Adopted: Yes    Social History   Tobacco Use  . Smoking status: Former Smoker    Packs/day: 0.25  . Smokeless tobacco: Never Used  . Tobacco comment: States she quit in 2020  Substance Use Topics  . Alcohol use: Yes    Comment: socially  . Drug use: No    Home Medications Prior to Admission medications   Medication Sig Start Date End Date Taking? Authorizing Provider  fluticasone (FLONASE) 50 MCG/ACT nasal spray Place 1 spray into both nostrils daily. 12/05/18   Petrucelli, Samantha R, PA-C  Multiple Vitamin (MULTIVITAMIN WITH  MINERALS) TABS tablet Take 1 tablet by mouth daily.    [provider]    Allergies    Beef-derived products, Ultram [tramadol], Allegra [fexofenadine], Augmentin [amoxicillin-pot clavulanate], Benadryl [diphenhydramine], Naldecon senior [guaifenesin], and Sudafed [pseudoephedrine]  Review of Systems   Review of Systems  Constitutional: Negative for chills and fever.  HENT: Positive for ear discharge, ear pain and hearing loss. Negative for facial swelling.   Neurological: Negative for dizziness, weakness, light-headedness and headaches.  All other systems reviewed and are negative.   Physical Exam Updated Vital Signs BP 122/76   Pulse 78   Temp 98.6 F (37 C) (Oral)   Resp 18   Ht 5' 2.5" (1.588 m)   Wt 79.4 kg   LMP 02/16/2020 (Approximate)   SpO2 98%   BMI 31.50 kg/m   Physical Exam Vitals and nursing note reviewed.  Constitutional:      General: She is not in acute distress.    Appearance: She is not ill-appearing.  HENT:     Head: Normocephalic.  Right Ear: Hearing, tympanic membrane, ear canal and external ear normal. No mastoid tenderness.     Left Ear: Decreased hearing noted. No mastoid tenderness.     Ears:     Comments: Left ear has moderate canal edema.  Visualized TM appears perforated.  No discharge or blood visualized in ear canal.     Nose: Nose normal. No rhinorrhea.  Cardiovascular:     Rate and Rhythm: Normal rate.  Pulmonary:     Effort: Pulmonary effort is normal. No respiratory distress.  Neurological:     Mental Status: She is alert.     Motor: No weakness.     Comments: Subjective decreased hearing in left ear.  No left sided facial droop.      ED Results / Procedures / Treatments   Labs (all labs ordered are listed, but only abnormal results are displayed) Labs Reviewed - No data to display  EKG None  Radiology No results found.  Procedures Procedures (including critical care time)  Medications Ordered in  ED Medications - No data to display  ED Course  I have reviewed the triage vital signs and the nursing notes.  Pertinent labs & imaging results that were available during my care of the patient were reviewed by me and considered in my medical decision making (see chart for details).    MDM Rules/Calculators/A&P                       Patient is a 34 year old woman who presents today for evaluation of pain in her left ear after she had her child bumped her arm causing her to accidentally put the crochet hook into her ear canal.  This happened 5 days ago.  On exam she has moderate canal edema without significant mastoid pain or tenderness or drainage.  Visualized TM appears perforated however difficult to visualize entirety of TM due to canal edema.  She has subjective decreased hearing.  I spoke with Dr. Elijah Birk, on-call for ENT who recommends no eardrops, outpatient follow-up and water precautions.  Patient's tetanus is up-to-date.  Based the low velocity do not suspect a temporal bone fracture, suspect reported drainage is from abrasion in ear canal and TM perforation.   Return precautions were discussed with patient who states their understanding.  At the time of discharge patient denied any unaddressed complaints or concerns.  Patient is agreeable for discharge home.  Note: Portions of this report may have been transcribed using voice recognition software. Every effort was made to ensure accuracy; however, inadvertent computerized transcription errors may be present   Final Clinical Impression(s) / ED Diagnoses Final diagnoses:  Activities involving knitting and crocheting  Ear pain, left    Rx / DC Orders ED Discharge Orders    None       Norman Clay 03/06/20 1649    Terald Sleeper, MD 03/07/20 [id number]

## 2020-03-06 NOTE — ED Notes (Signed)
An After Visit Summary was printed and given to the patient. Discharge instructions given and no further questions at this time.  

## 2020-03-06 NOTE — Discharge Instructions (Addendum)
Please keep water out of the ear.  I spoke with Dr. Elijah Birk and he recommends no ear drops for now.   If you develop worsening symptoms please seek additional medical care and evaluation.

## 2020-07-06 IMAGING — US US ABDOMEN LIMITED
1 series · 14 of 25 positions shown · non-contrast
Comparison: None.

CLINICAL DATA: Right upper quadrant pain

EXAM:
ULTRASOUND ABDOMEN LIMITED RIGHT UPPER QUADRANT

[Series 1: us abdomen limited · 14 of 35 slices shown]
[im 1/35]
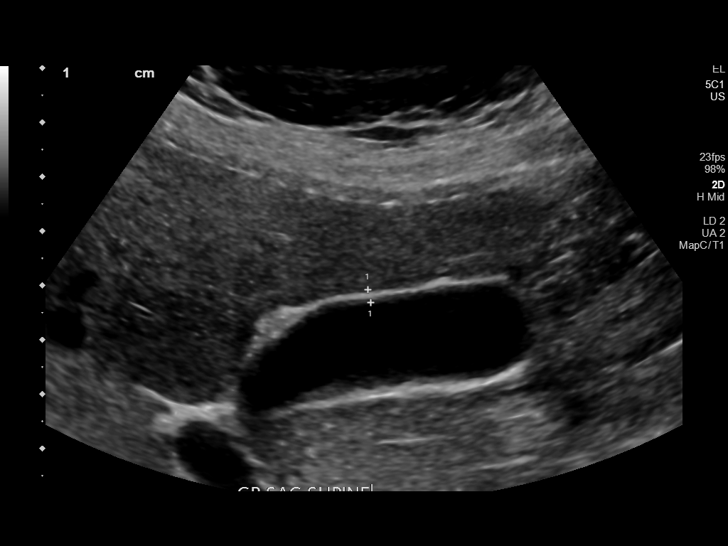
[im 3/35]
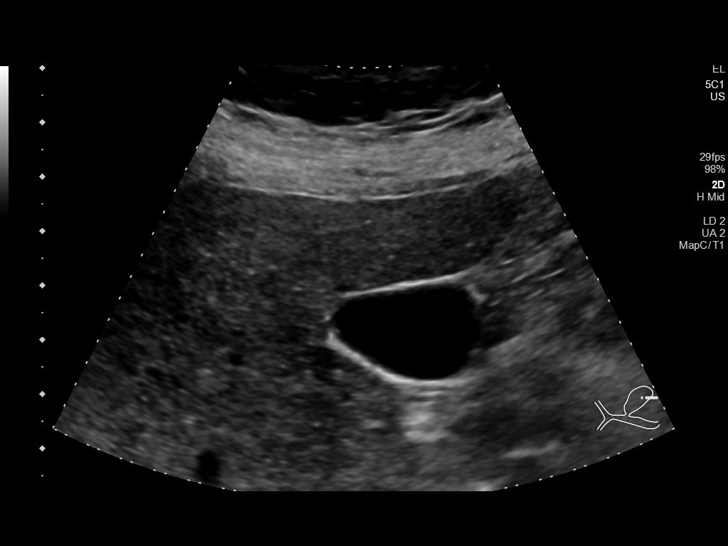
[im 6/35]
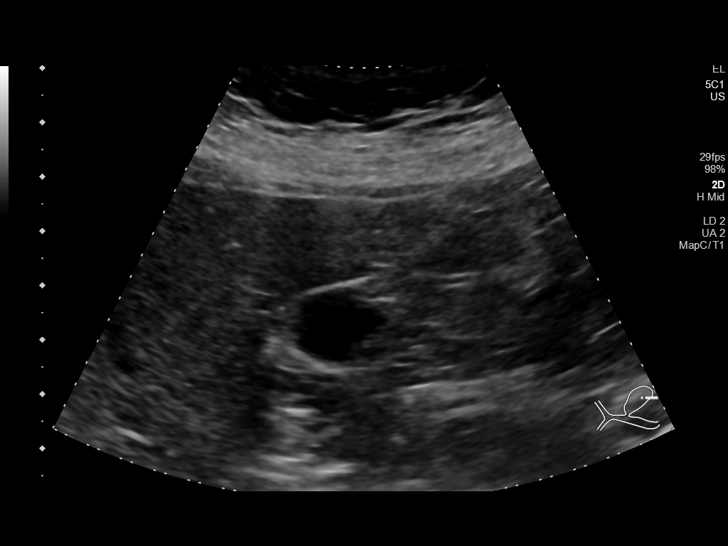
[im 9/35]
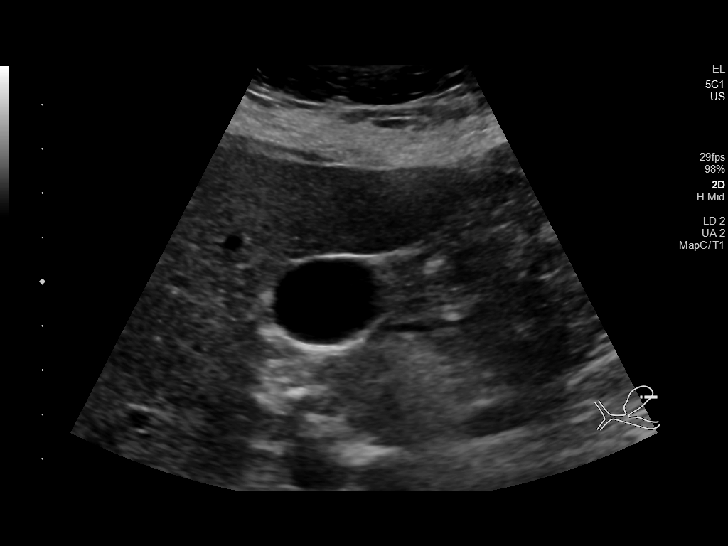
[im 12/35]
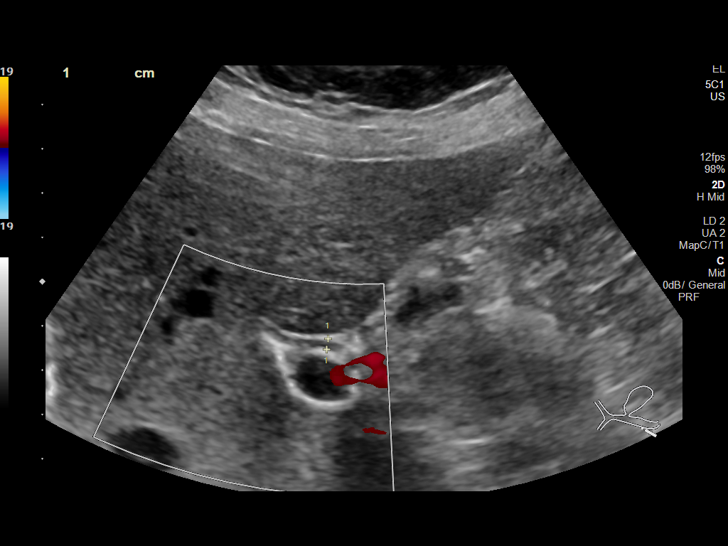
[im 13/35]
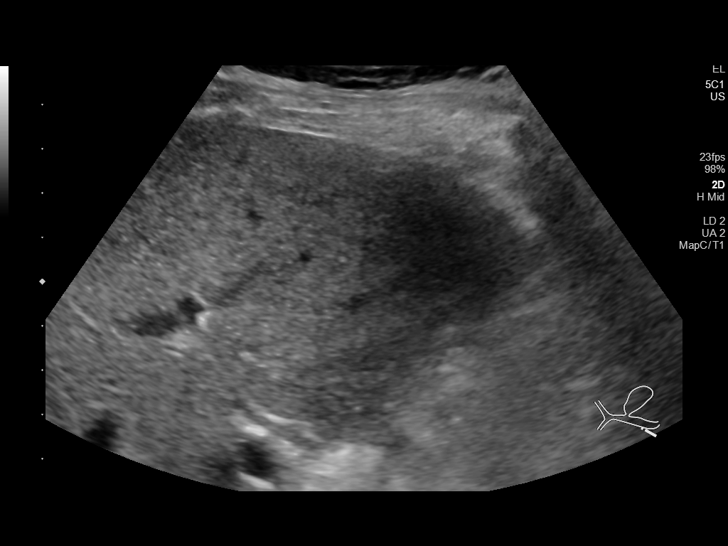
[im 16/35]
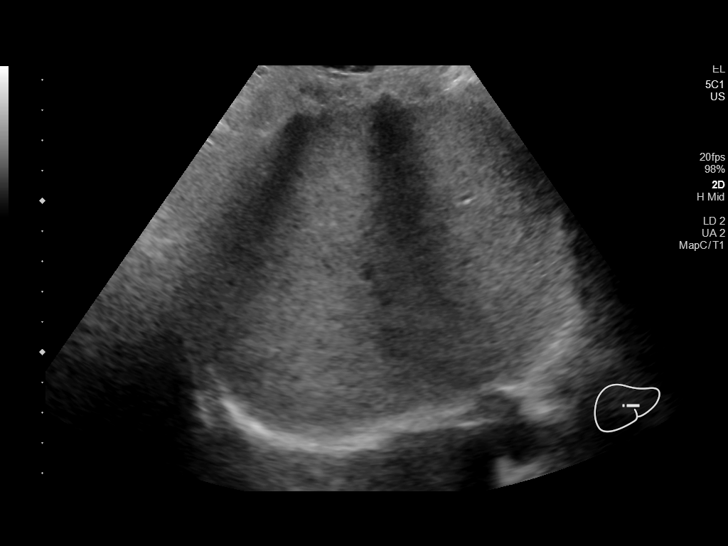
[im 19/35]
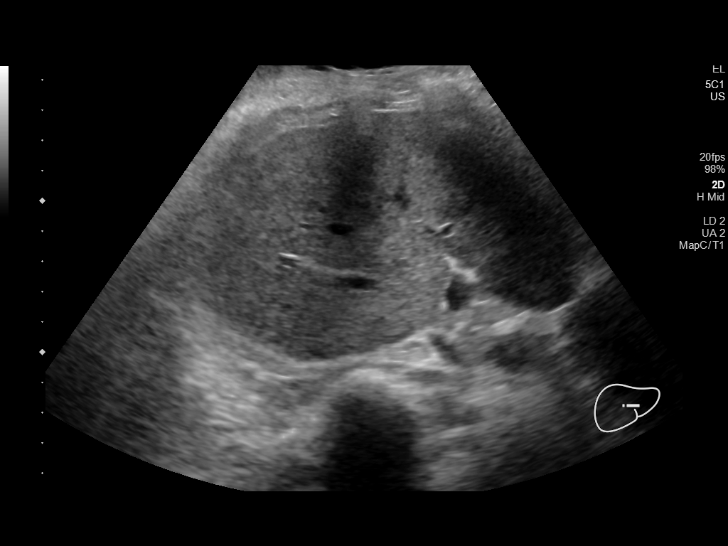
[im 22/35]
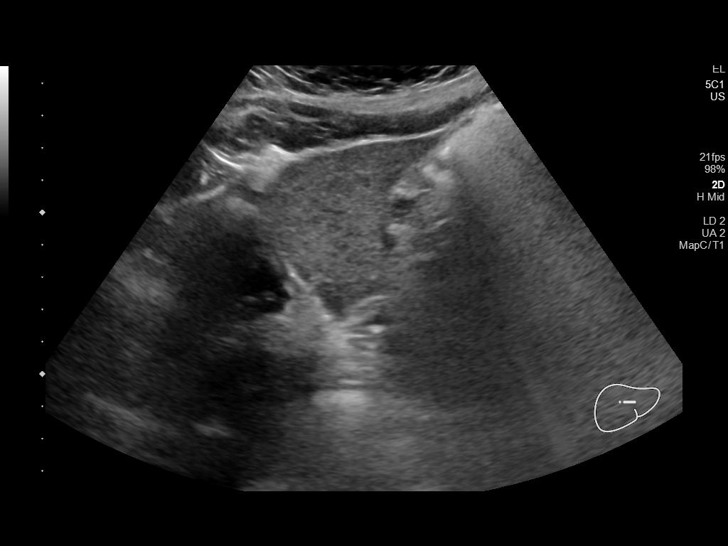
[im 23/35]
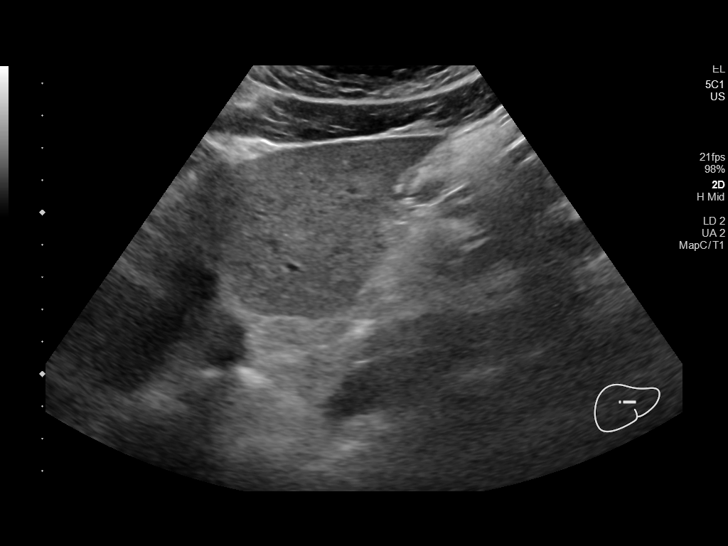
[im 26/35]
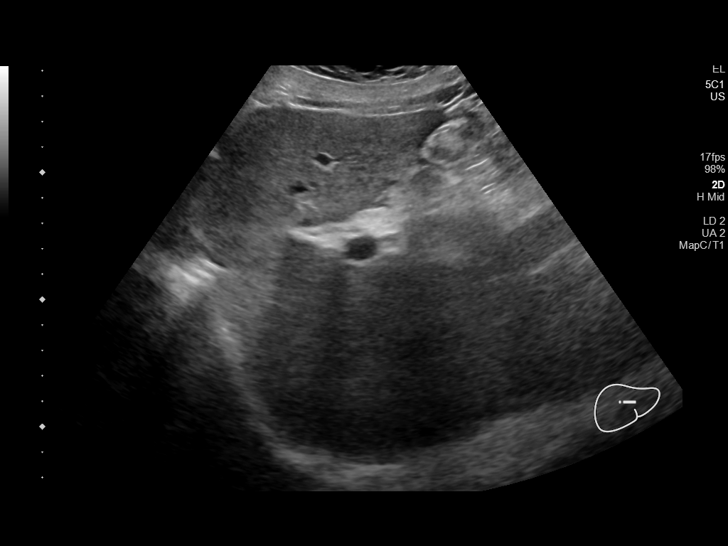
[im 29/35]
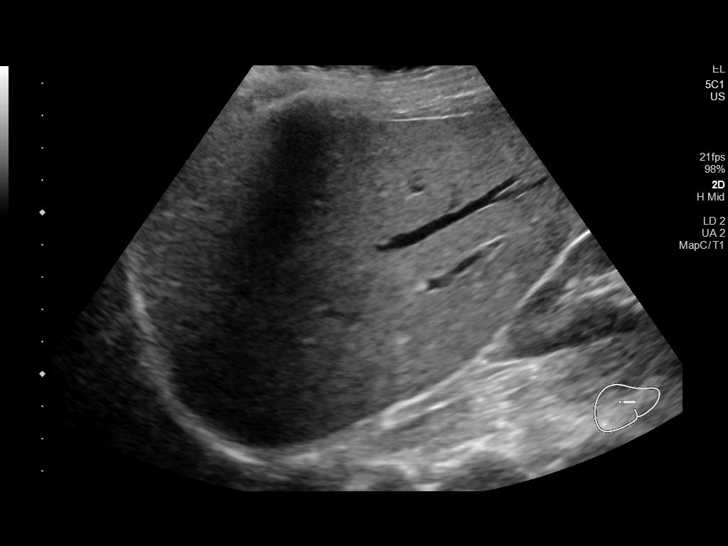
[im 32/35]
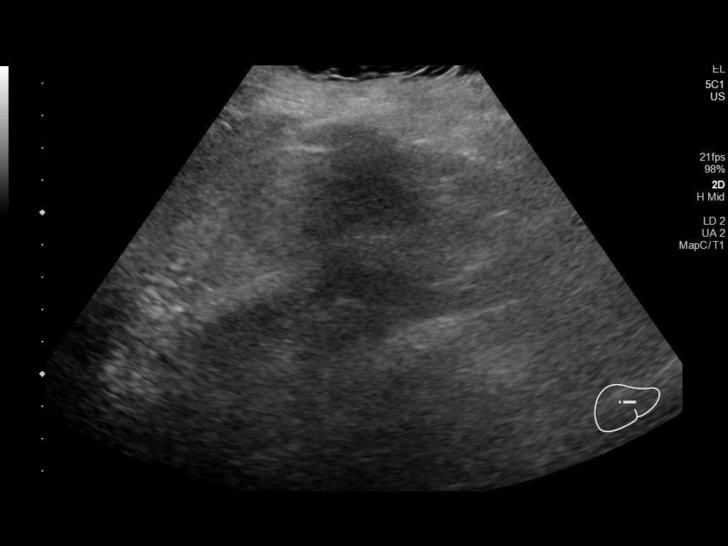
[im 35/35]
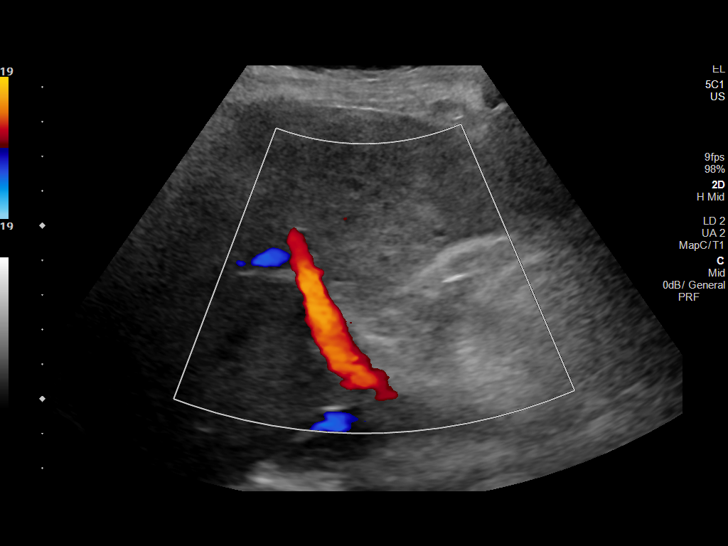

[14 of 25 positions shown; findings below may reference images not displayed]

FINDINGS: Gallbladder:

No gallstones or wall thickening visualized. There is no
pericholecystic fluid. No sonographic Murphy sign noted by
sonographer.

Common bile duct:

Diameter: 2 mm. No intrahepatic or extrahepatic biliary duct
dilatation.

Liver:

No focal lesion identified. Liver echogenicity overall is increased.
Portal vein is patent on color Doppler imaging with normal direction
of blood flow towards the liver.

Other: None.
IMPRESSION: Increased liver echogenicity, a finding indicative of a degree of
hepatic steatosis. No focal liver lesions are evident.

Study otherwise unremarkable.

## 2020-09-01 ENCOUNTER — Inpatient Hospital Stay (HOSPITAL_COMMUNITY)
Admission: AD | Admit: 2020-09-01 | Discharge: 2020-09-01 | Disposition: A | Discharge status: Home / Self Care | Payer: Medicaid Other | Attending: Obstetrics & Gynecology | Admitting: Obstetrics & Gynecology

## 2020-09-01 ENCOUNTER — Other Ambulatory Visit: Payer: Self-pay

## 2020-09-01 DIAGNOSIS — R102 Pelvic and perineal pain: Secondary | ICD-10-CM

## 2020-09-01 DIAGNOSIS — R3912 Poor urinary stream: Secondary | ICD-10-CM | POA: Diagnosis present

## 2020-09-01 NOTE — MAU Provider Note (Signed)
First Provider Initiated Contact with Patient 09/01/20 1114     S Ms. Christine Orozco is a 34 y.o. [phone] non-pregnant female who presents to MAU today with complaint of difficulty urinating, can void it is just slow.  Pt is not pregnant, feels something is protruding from her vagina.  O BP 139/83 (BP Location: Right Arm)   Pulse 96   Temp 98.6 F (37 C) (Oral)   Resp 16   Ht 5\' 3"  (1.6 m)   Wt 172 lb 9.6 oz (78.3 kg)   LMP 08/21/2020   SpO2 99%   BMI 30.57 kg/m  Physical Exam Vitals and nursing note reviewed.  Constitutional:      General: She is not in acute distress.    Appearance: Normal appearance. She is normal weight. She is not ill-appearing.  HENT:     Mouth/Throat:     Mouth: Mucous membranes are dry.  Eyes:     Pupils: Pupils are equal, round, and reactive to light.  Cardiovascular:     Rate and Rhythm: Normal rate.     Pulses: Normal pulses.  Pulmonary:     Effort: Pulmonary effort is normal.  Skin:    General: Skin is warm and dry.     Capillary Refill: Capillary refill takes less than 2 seconds.  Neurological:     Mental Status: She is alert and oriented to person, place, and time.  Psychiatric:        Mood and Affect: Mood normal.        Behavior: Behavior normal.        Thought Content: Thought content normal.        Judgment: Judgment normal.    A Vaginal pain Medical screening exam complete  P Discharge from MAU in stable condition Called UroGyn at [facility], they have a new patient appointment on 09/09/20. Pt given information and encouraged to book appt. Reassured patient that although uncomfortable, she is not emergent and she deserves care in an office setting for more exploration and testing. Warning signs for worsening condition that would warrant emergency follow-up discussed. Patient may return to MAU as needed for emergent OB/GYN related complaints  14/8/21, CNM 09/01/2020 11:46 AM

## 2020-09-01 NOTE — MAU Note (Signed)
Was a little itching down there yesterday.  When she wiped today, some discomfort, feels like there is coming out.  Rough, not like normal skin. ? Leaking, is hard to urinate, slow trickle.

## 2020-09-29 ENCOUNTER — Telehealth: Payer: Medicaid Other | Admitting: Physician Assistant

## 2020-09-29 DIAGNOSIS — K047 Periapical abscess without sinus: Secondary | ICD-10-CM | POA: Diagnosis not present

## 2020-09-29 MED ORDER — CLINDAMYCIN HCL 150 MG PO CAPS: 300.0000 mg | ORAL_CAPSULE | Freq: Three times a day (TID) | ORAL | 0 refills | Status: DC

## 2020-09-29 NOTE — Progress Notes (Signed)
E-Visit for Dental Pain  We are sorry that you are not feeling well.  Here is how we plan to help!  Based on what you have shared with me in the questionnaire, it sounds like you have a dental infection.  Clindamycin 300mg  3 times per day for 10 days  It is imperative that you see a dentist within 10 days of this eVisit to determine the cause of the dental pain and be sure it is adequately treated  A toothache or tooth pain is caused when the nerve in the root of a tooth or surrounding a tooth is irritated. Dental (tooth) infection, decay, injury, or loss of a tooth are the most common causes of dental pain. Pain may also occur after an extraction (tooth is pulled out). Pain sometimes originates from other areas and radiates to the jaw, thus appearing to be tooth pain.Bacteria growing inside your mouth can contribute to gum disease and dental decay, both of which can cause pain. A toothache occurs from inflammation of the central portion of the tooth called pulp. The pulp contains nerve endings that are very sensitive to pain. Inflammation to the pulp or pulpitis may be caused by dental cavities, trauma, and infection.    HOME CARE:   For toothaches: . Over-the-counter pain medications such as acetaminophen or ibuprofen may be used. Take these as directed on the package while you arrange for a dental appointment. . Avoid very cold or hot foods, because they may make the pain worse. . You may get relief from biting on a cotton ball soaked in oil of cloves. You can get oil of cloves at most drug stores.  For jaw pain: .  Aspirin may be helpful for problems in the joint of the jaw in adults. . If pain happens every time you open your mouth widely, the temporomandibular joint (TMJ) may be the source of the pain. Yawning or taking a large bite of food may worsen the pain. An appointment with your doctor or dentist will help you find the cause.     GET HELP RIGHT AWAY IF:  . You have a high  fever or chills . If you have had a recent head or face injury and develop headache, light headedness, nausea, vomiting, or other symptoms that concern you after an injury to your face or mouth, you could have a more serious injury in addition to your dental injury. . A facial rash associated with a toothache: This condition may improve with medication. Contact your doctor for them to decide what is appropriate. . Any jaw pain occurring with chest pain: Although jaw pain is most commonly caused by dental disease, it is sometimes referred pain from other areas. People with heart disease, especially people who have had stents placed, people with diabetes, or those who have had heart surgery may have jaw pain as a symptom of heart attack or angina. If your jaw or tooth pain is associated with lightheadedness, sweating, or shortness of breath, you should see a doctor as soon as possible. . Trouble swallowing or excessive pain or bleeding from gums: If you have a history of a weakened immune system, diabetes, or steroid use, you may be more susceptible to infections. Infections can often be more severe and extensive or caused by unusual organisms. Dental and gum infections in people with these conditions may require more aggressive treatment. An abscess may need draining or IV antibiotics, for example.  MAKE SURE YOU    Understand these instructions.  Will watch your condition.  Will get help right away if you are not doing well or get worse.  Thank you for choosing an e-visit. Your e-visit answers were reviewed by a board certified advanced clinical practitioner to complete your personal care plan. Depending upon the condition, your plan could have included both over the counter or prescription medications. Please review your pharmacy choice. Make sure the pharmacy is open so you can pick up prescription now. If there is a problem, you may contact your provider through [employer] and have the  prescription routed to another pharmacy. Your safety is important to [place]. If you have drug allergies check your prescription carefully.  For the next 24 hours you can use MyChart to ask questions about today's visit, request a non-urgent call back, or ask for a work or school excuse. You will get an email in the next two days asking about your experience. I hope that your e-visit has been valuable and will speed your recovery.  Greater than 5 minutes, yet less than 10 minutes of time have been spent researching, coordinating, and implementing care for this patient today

## 2020-11-09 IMAGING — US US OB < 14 WEEKS - US OB TV
1 series · 15 of 28 positions shown · non-contrast
Comparison: None.

CLINICAL DATA: Cramping and right lower quadrant pain

EXAM:
OBSTETRIC <14 WK US AND TRANSVAGINAL OB US
TECHNIQUE: Both transabdominal and transvaginal ultrasound examinations were
performed for complete evaluation of the gestation as well as the
maternal uterus, adnexal regions, and pelvic cul-de-sac.
Transvaginal technique was performed to assess early pregnancy.

[Series 1: us ob < 14 weeks - us ob tv · 15 of 54 slices shown]
[im 1/54]
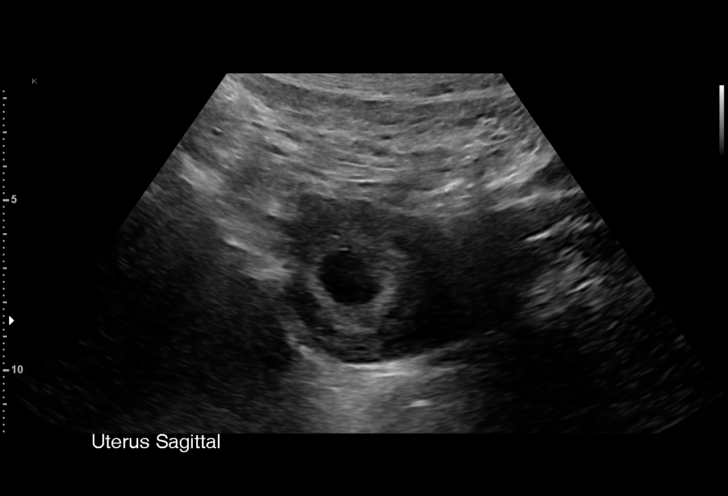
[im 4/54]
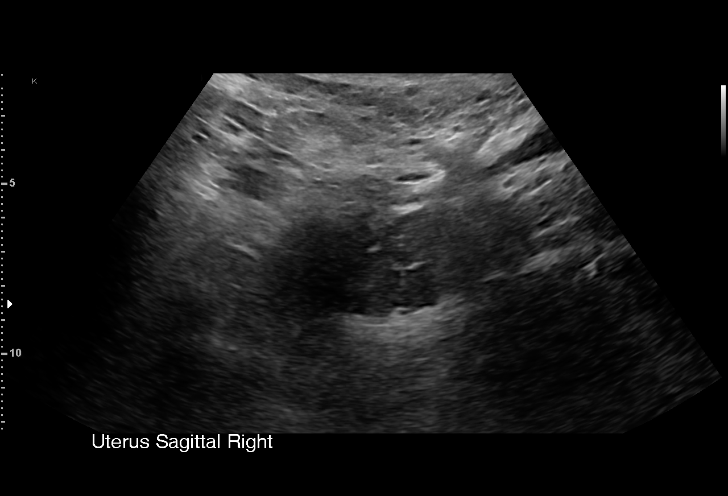
[im 8/54]
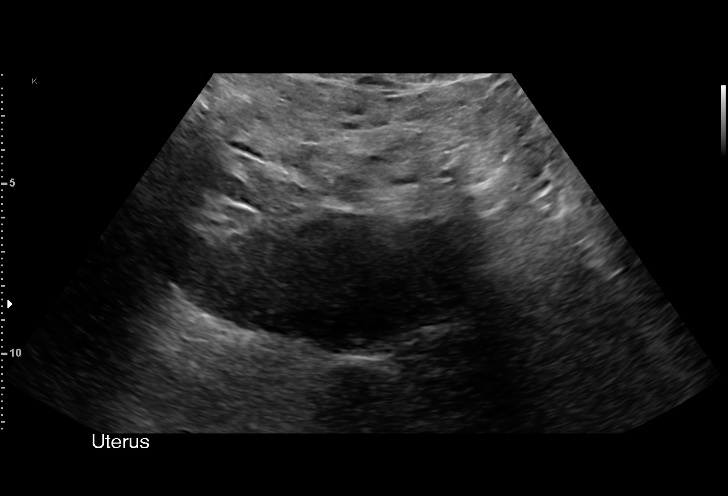
[im 12/54]
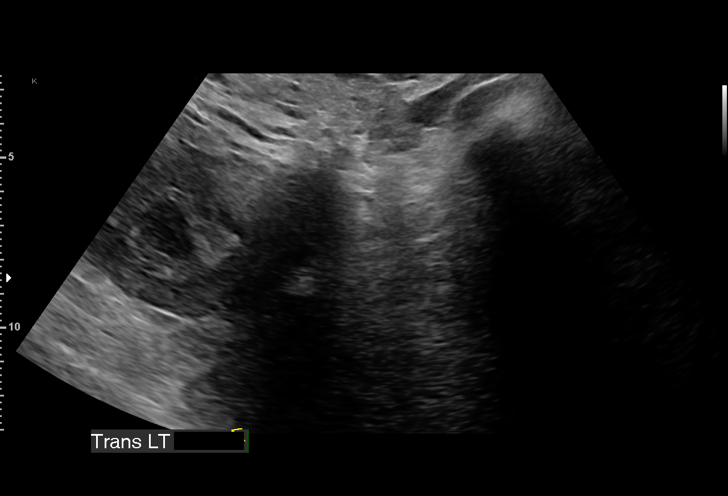
[im 16/54]
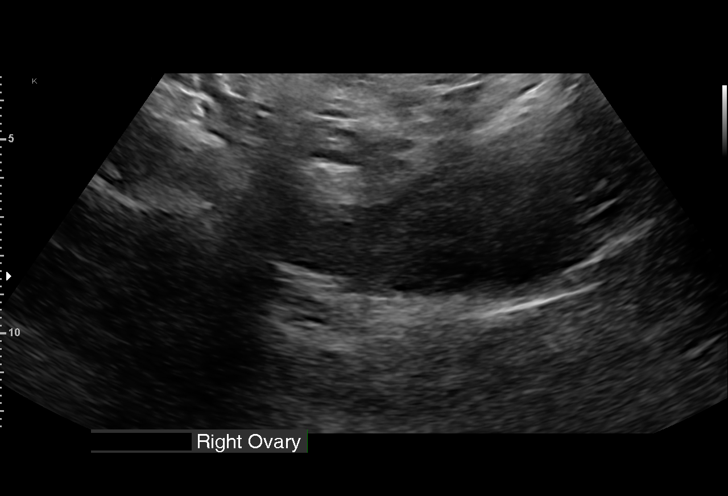
[im 20/54]
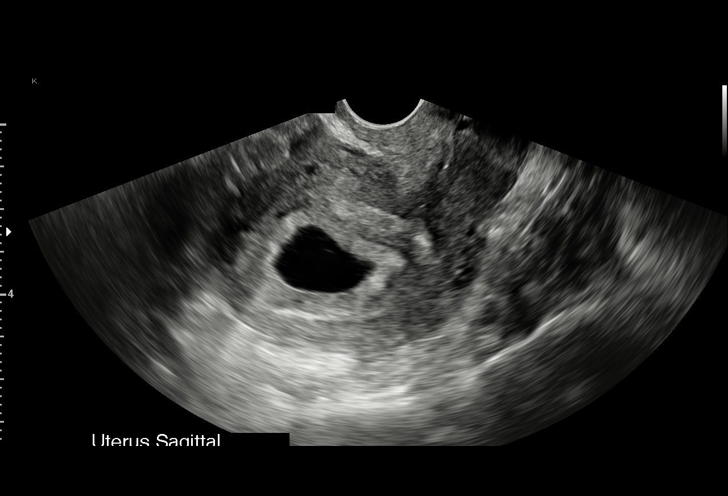
[im 24/54]
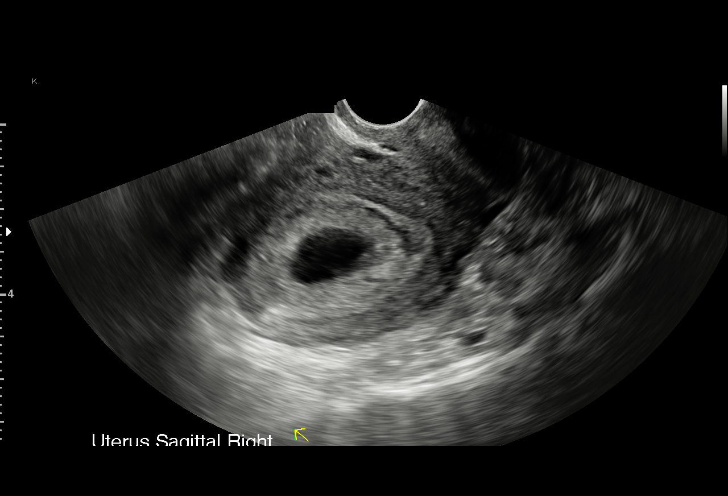
[im 28/54]
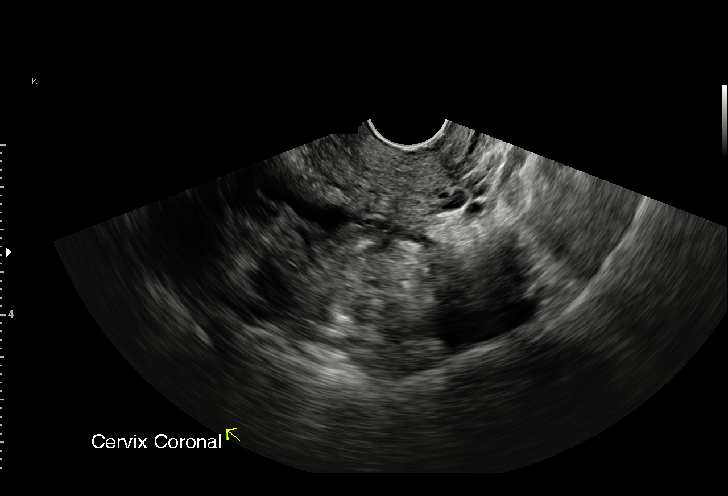
[im 30/54]
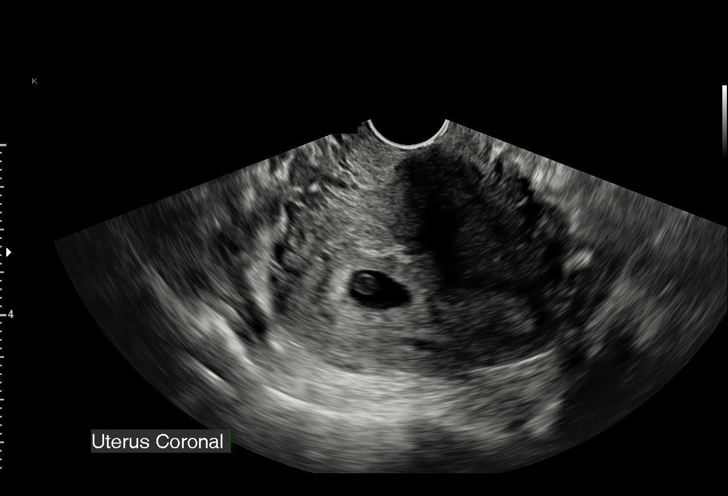
[im 34/54]
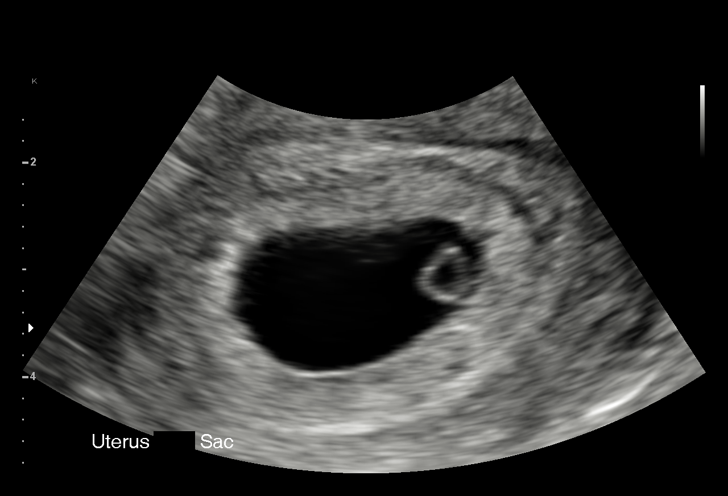
[im 38/54]
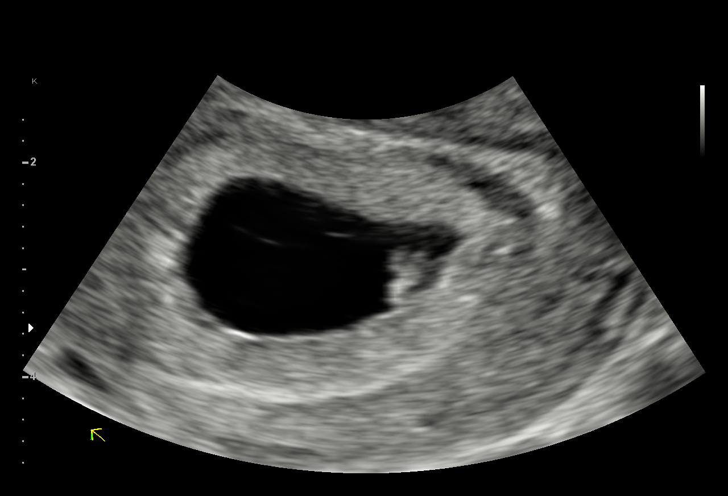
[im 42/54]
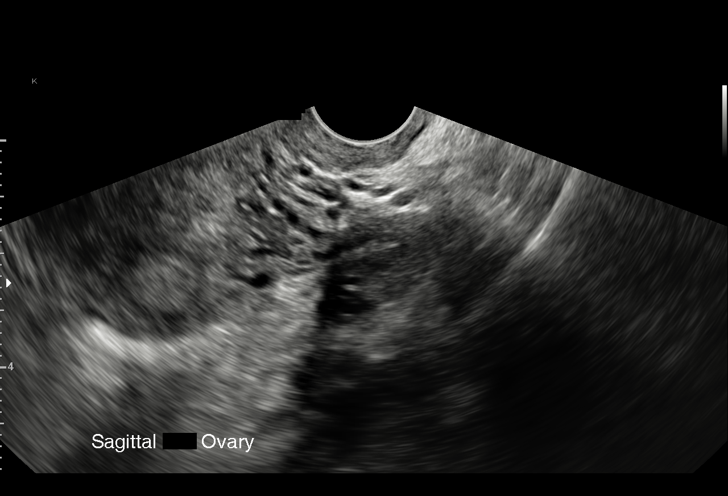
[im 46/54]
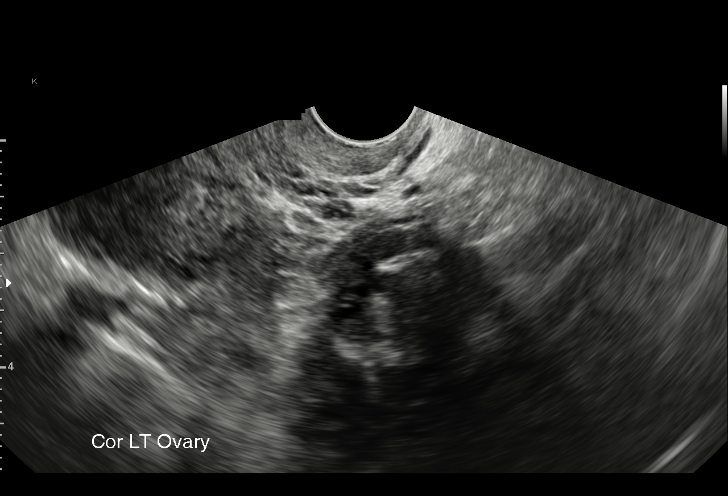
[im 50/54]
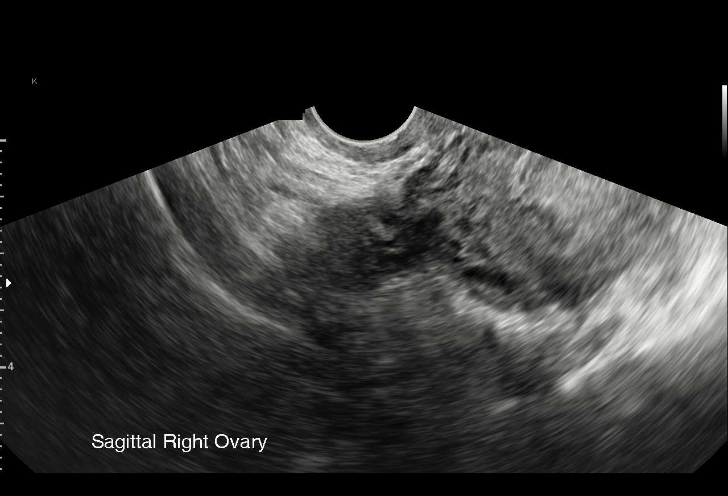
[im 54/54]
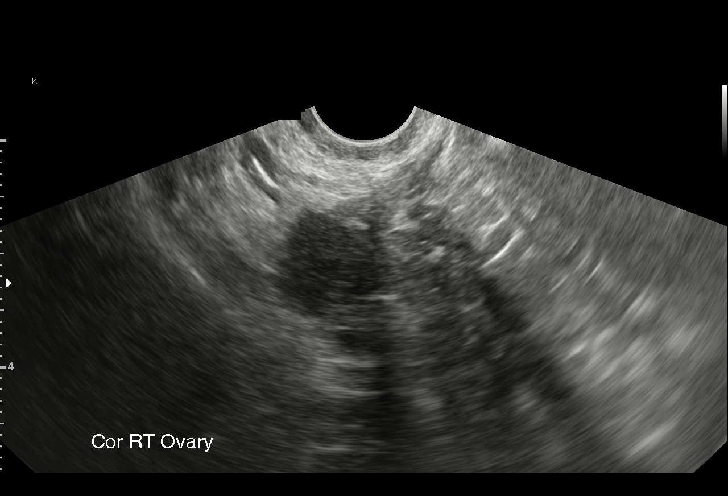

[15 of 28 positions shown; findings below may reference images not displayed]

FINDINGS: Intrauterine gestational sac: Single

Yolk sac:  Visualized.

Embryo:  Visualized.

Cardiac Activity: Visualized.

Heart Rate: 130 bpm

CRL:  4.8 mm   6 w   1 d                  US EDC: 08/31/2020

Subchorionic hemorrhage:  None visualized.

Maternal uterus/adnexae: The ovaries are unremarkable. There is no
significant maternal abnormality detected.
IMPRESSION: Single live IUP at 6 weeks and 1 day. No maternal abnormality
detected.

## 2020-11-13 IMAGING — US US OB TRANSVAGINAL
1 series · 15 of 25 positions shown · non-contrast
Comparison: Ultrasound dated 01/07/2020.

CLINICAL DATA: 34-year-old female status post abortion with vaginal
bleeding.

EXAM:
TRANSVAGINAL OB ULTRASOUND
TECHNIQUE: Transvaginal ultrasound was performed for complete evaluation of the
gestation as well as the maternal uterus, adnexal regions, and
pelvic cul-de-sac.

[Series 1: us ob transvaginal · 25 acquisitions, 15 frames shown]
[im 1/25]
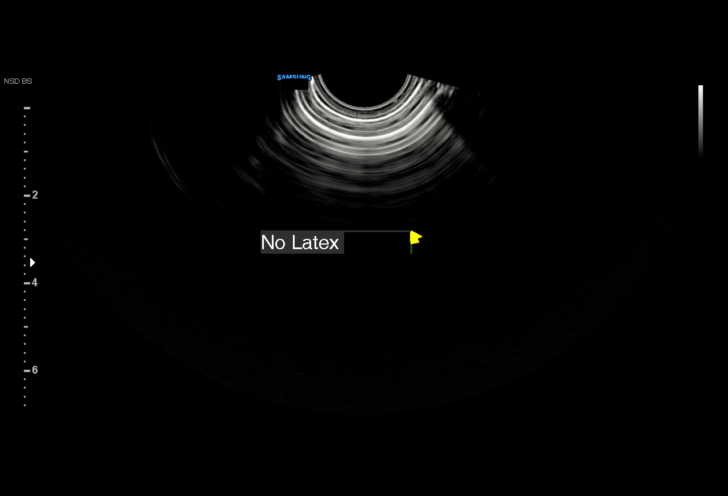
[im 3/25]
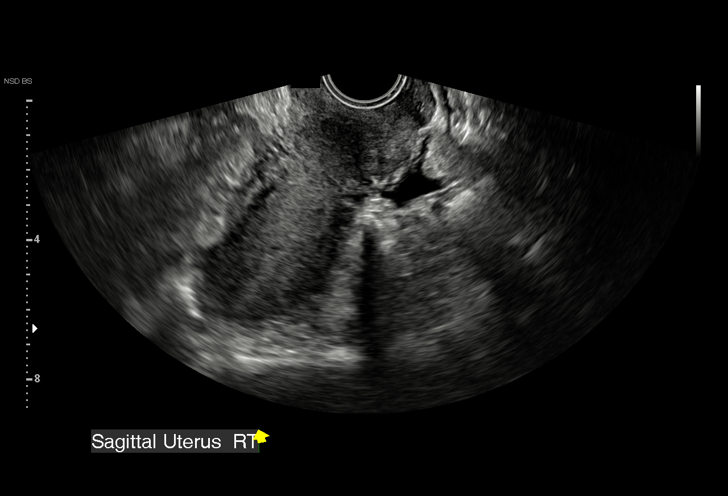
[im 5/25]
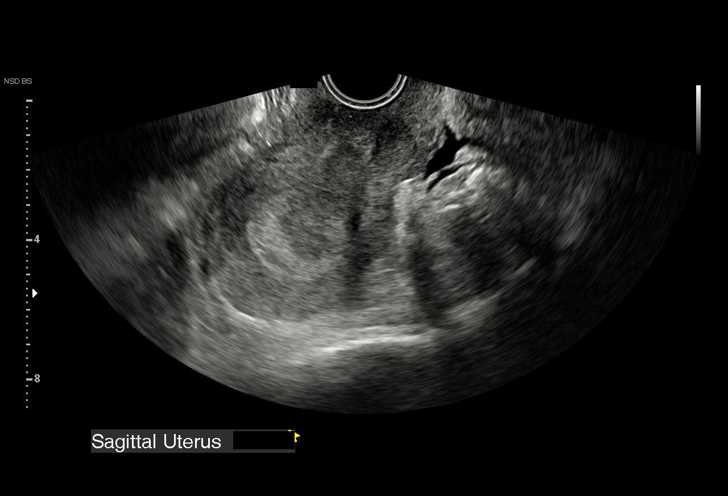
[im 6/25]
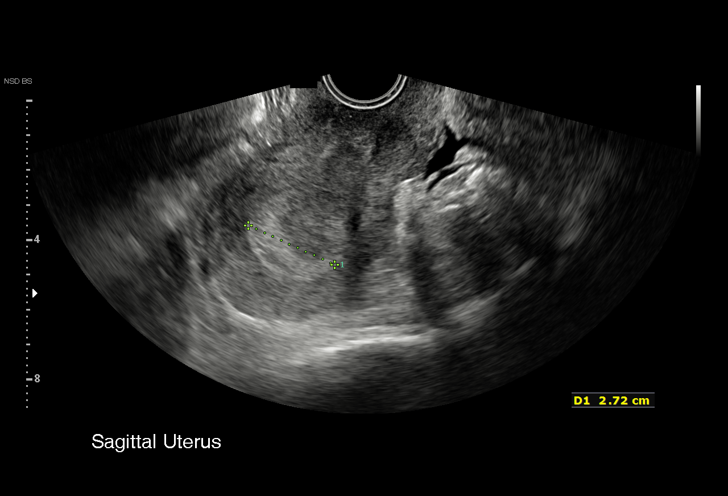
[im 8/25]
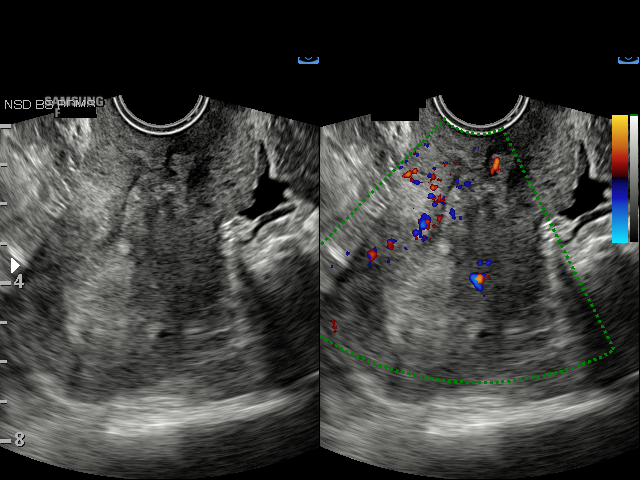
[im 10/25]
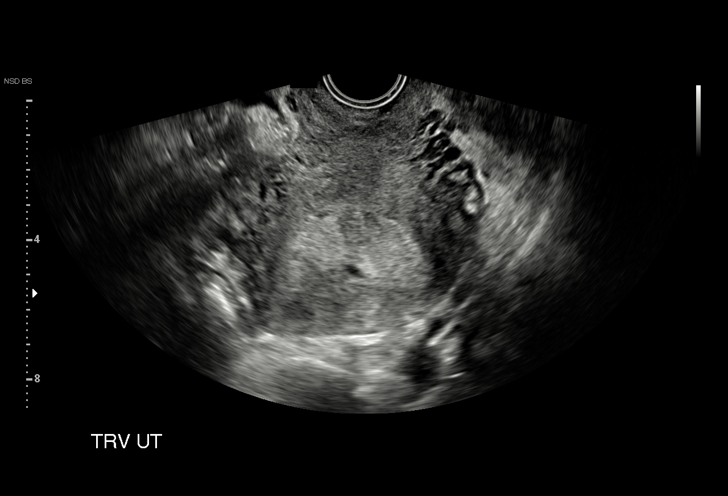
[im 11/25]
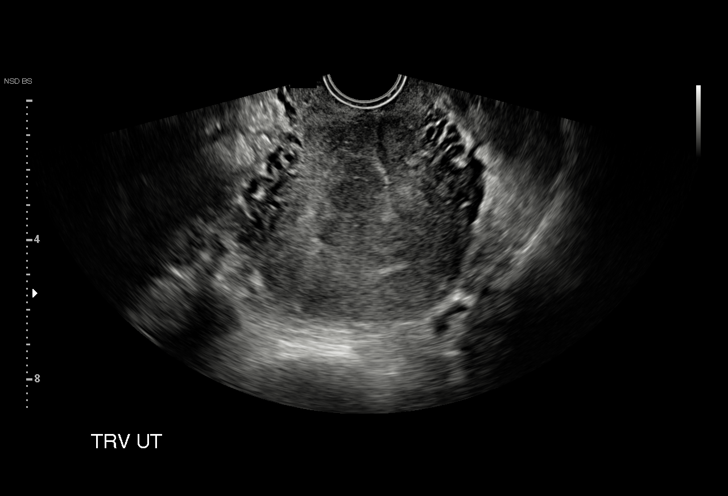
[im 13/25]
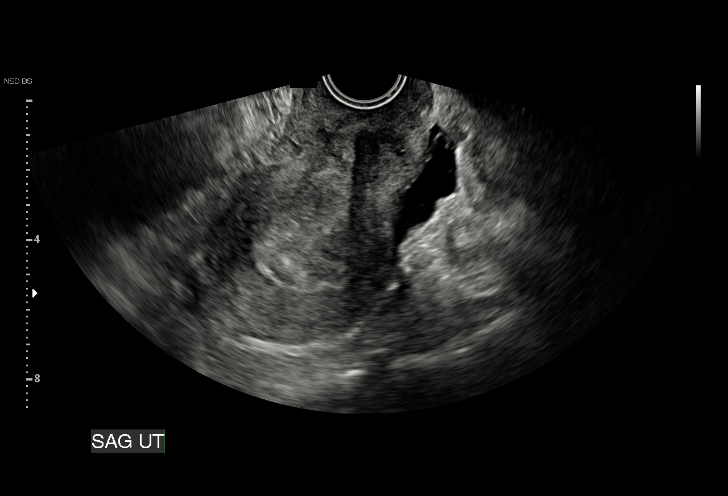
[im 15/25]
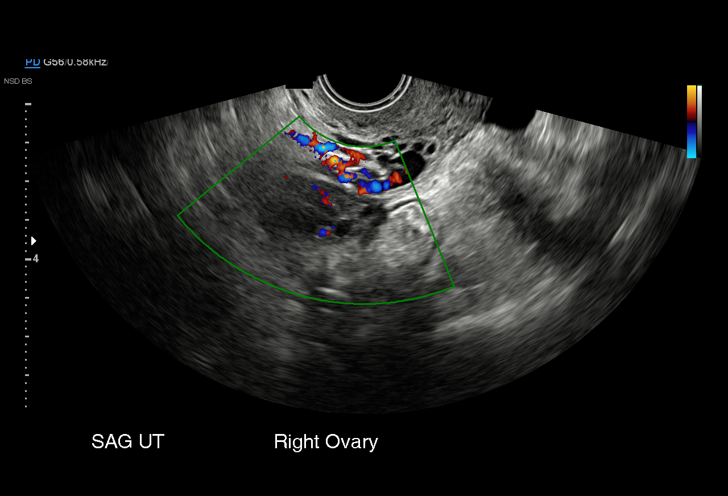
[im 16/25]
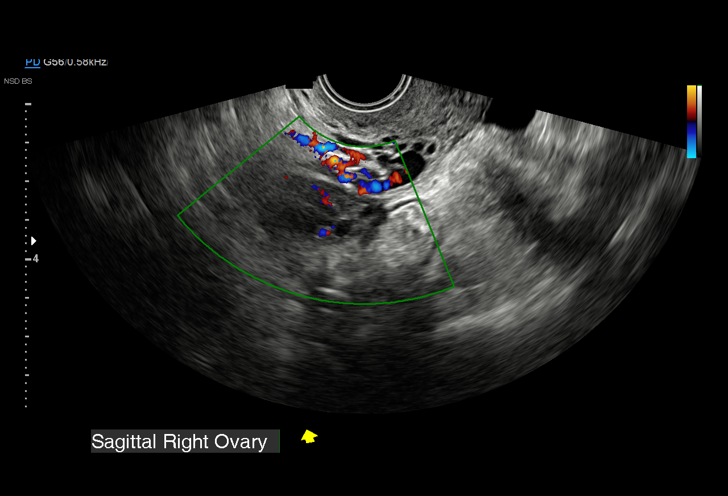
[im 18/25]
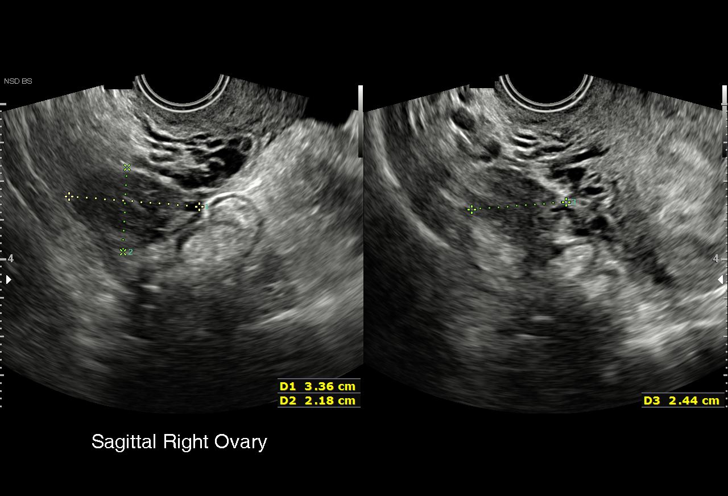
[im 20/25]
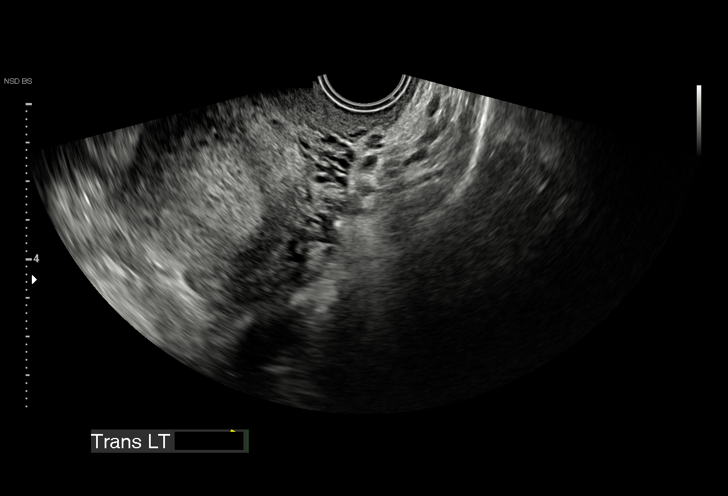
[im 21/25]
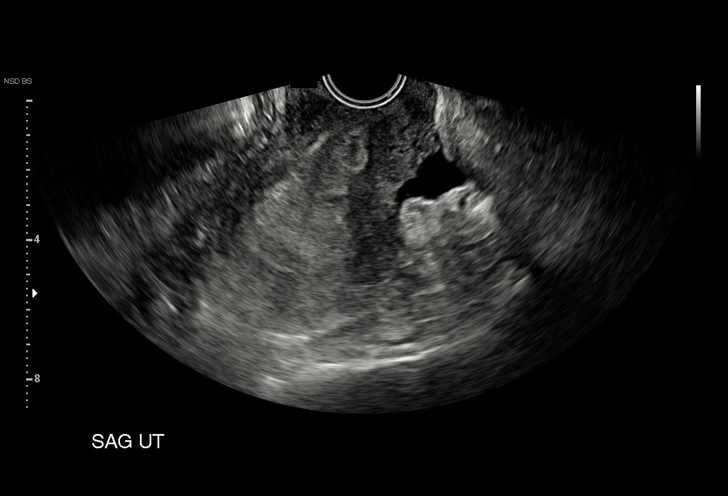
[im 23/25]
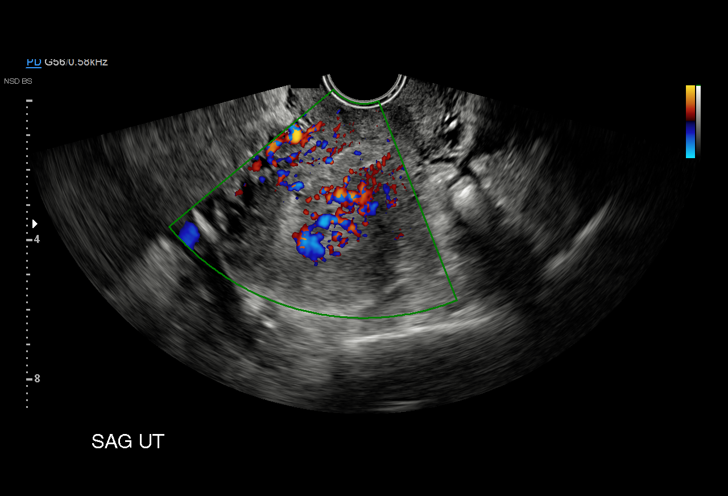
[im 25/25]
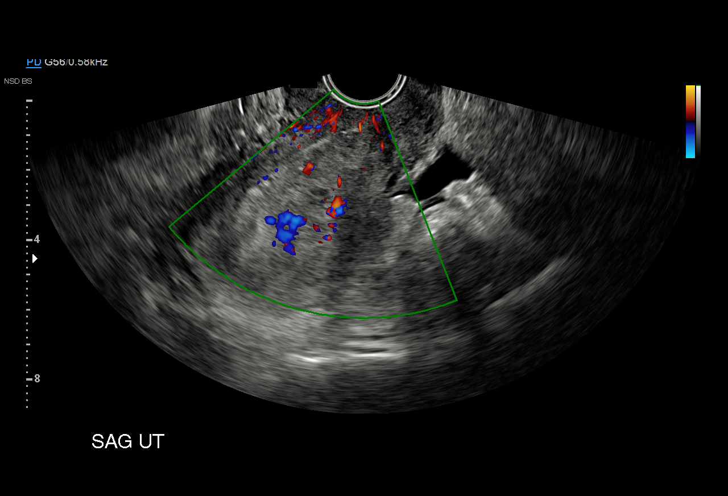

[15 of 25 positions shown; findings below may reference images not displayed]

FINDINGS: The uterus is anteverted appears unremarkable.

The endometrium is thickened and heterogeneous measuring 2.7 cm in
thickness. The previously seen intrauterine pregnancy is no longer
visualized in keeping with provided history of abortion.

Heterogeneous and echogenic content within the endometrium with
internal vascularity noted concerning for retained product of
conception. Correlation with clinical exam and HCG levels
recommended.

The right ovary is unremarkable.  The left ovary is not visualized.

There is a small free fluid within the pelvis.
IMPRESSION: 1. Nonvisualization of the previously seen IUP in keeping with
abortion.
2. Thickened and echogenic endometrium with increased vascularity
concerning for retained product of conception. Clinical correlation
is recommended.

## 2020-12-21 IMAGING — CR DG SHOULDER 2+V*R*
3 series · 3 of 3 positions shown · non-contrast
Comparison: Chest radiographs 12/05/2018.

CLINICAL DATA: 34-year-old female with right shoulder pain after
heavy lifting at work.

EXAM:
RIGHT SHOULDER - 2+ VIEW

[w shoulder external right]
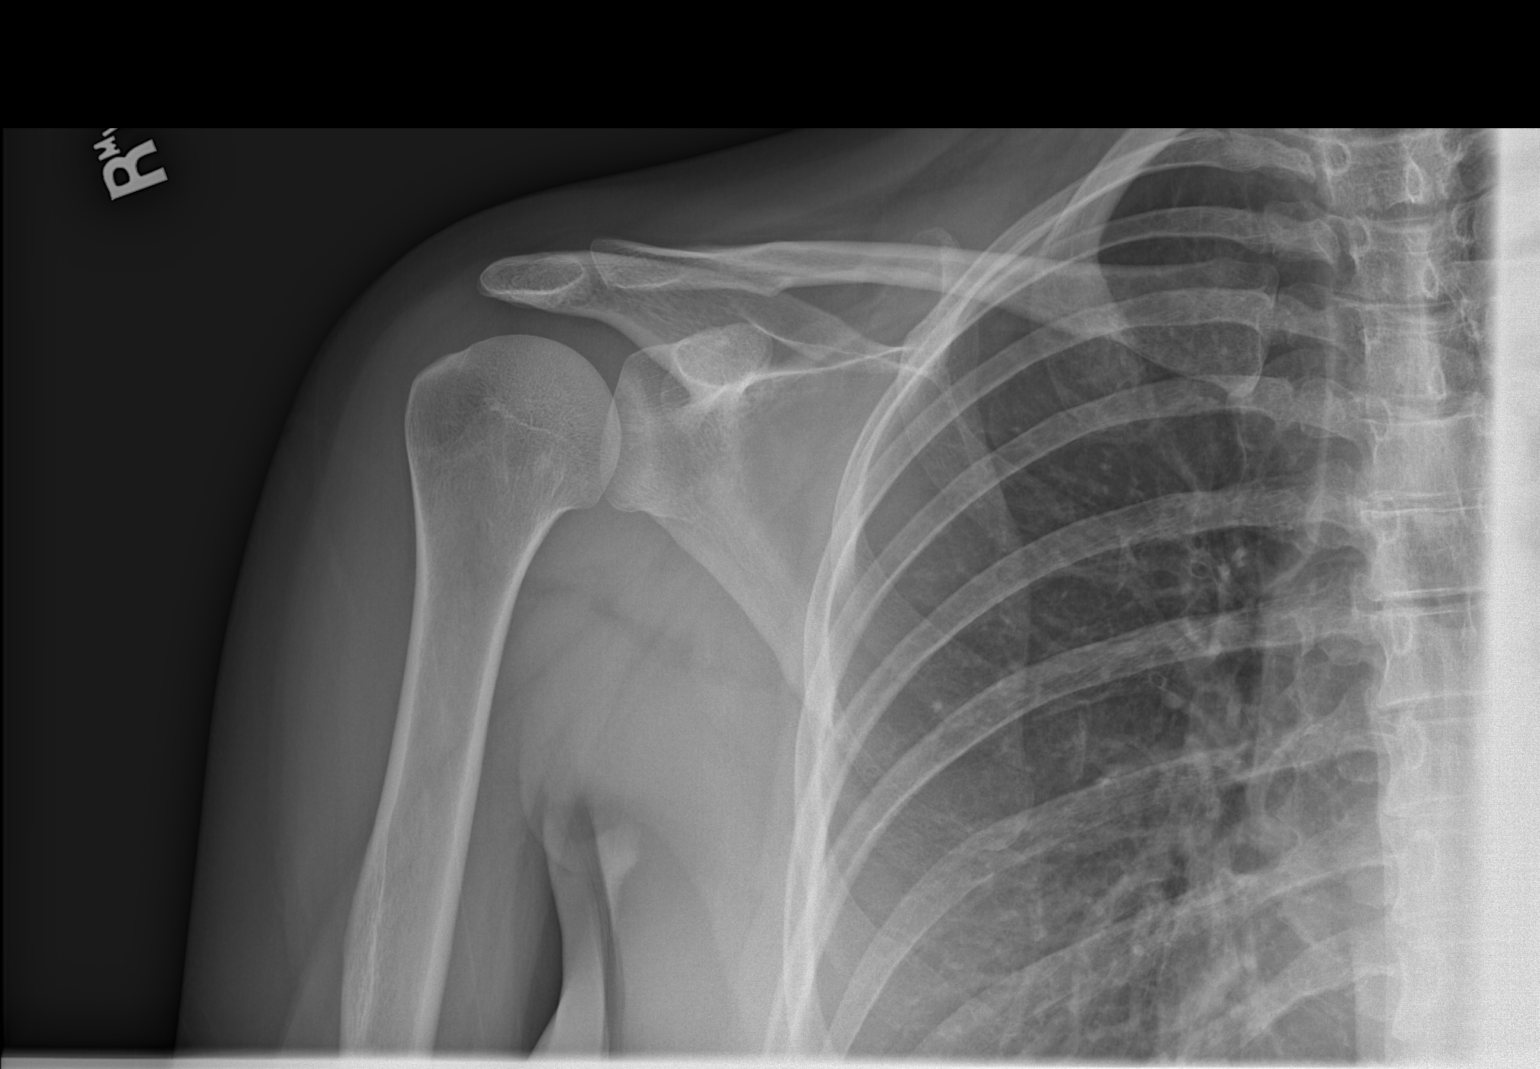

[w shoulder y-view right]
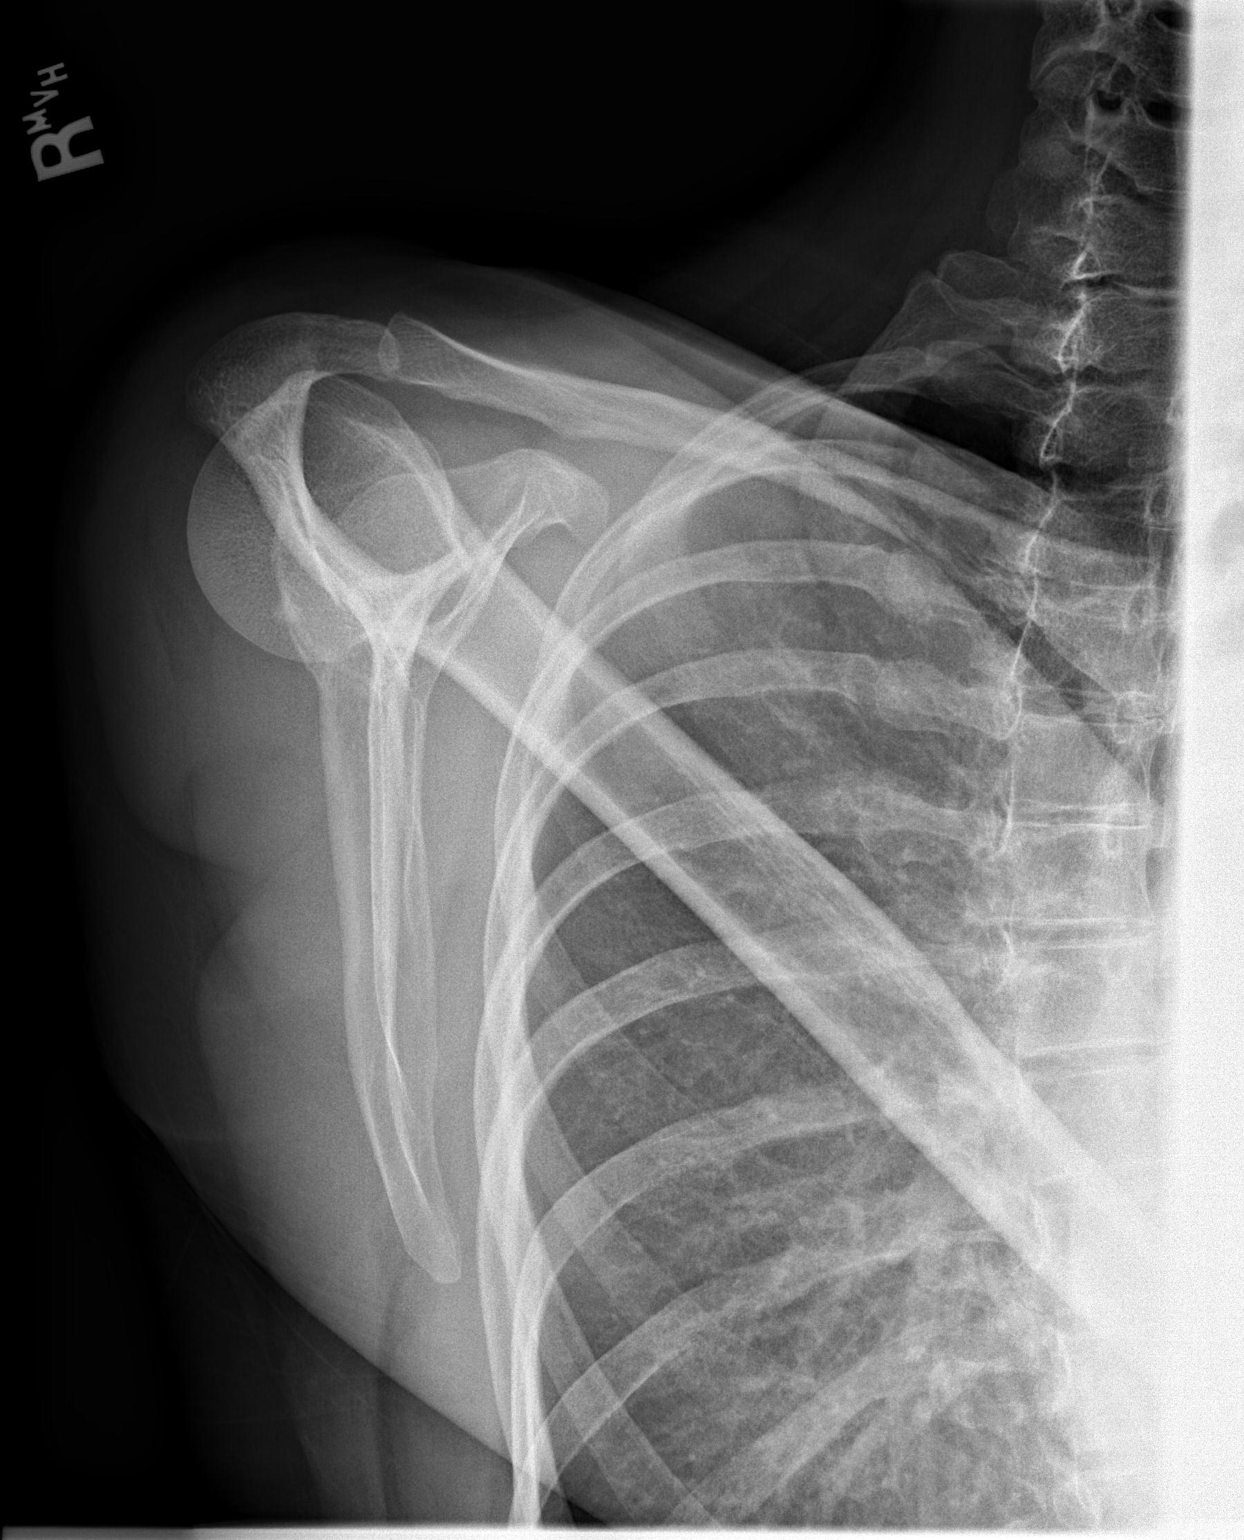

[x shoulder axillary right]
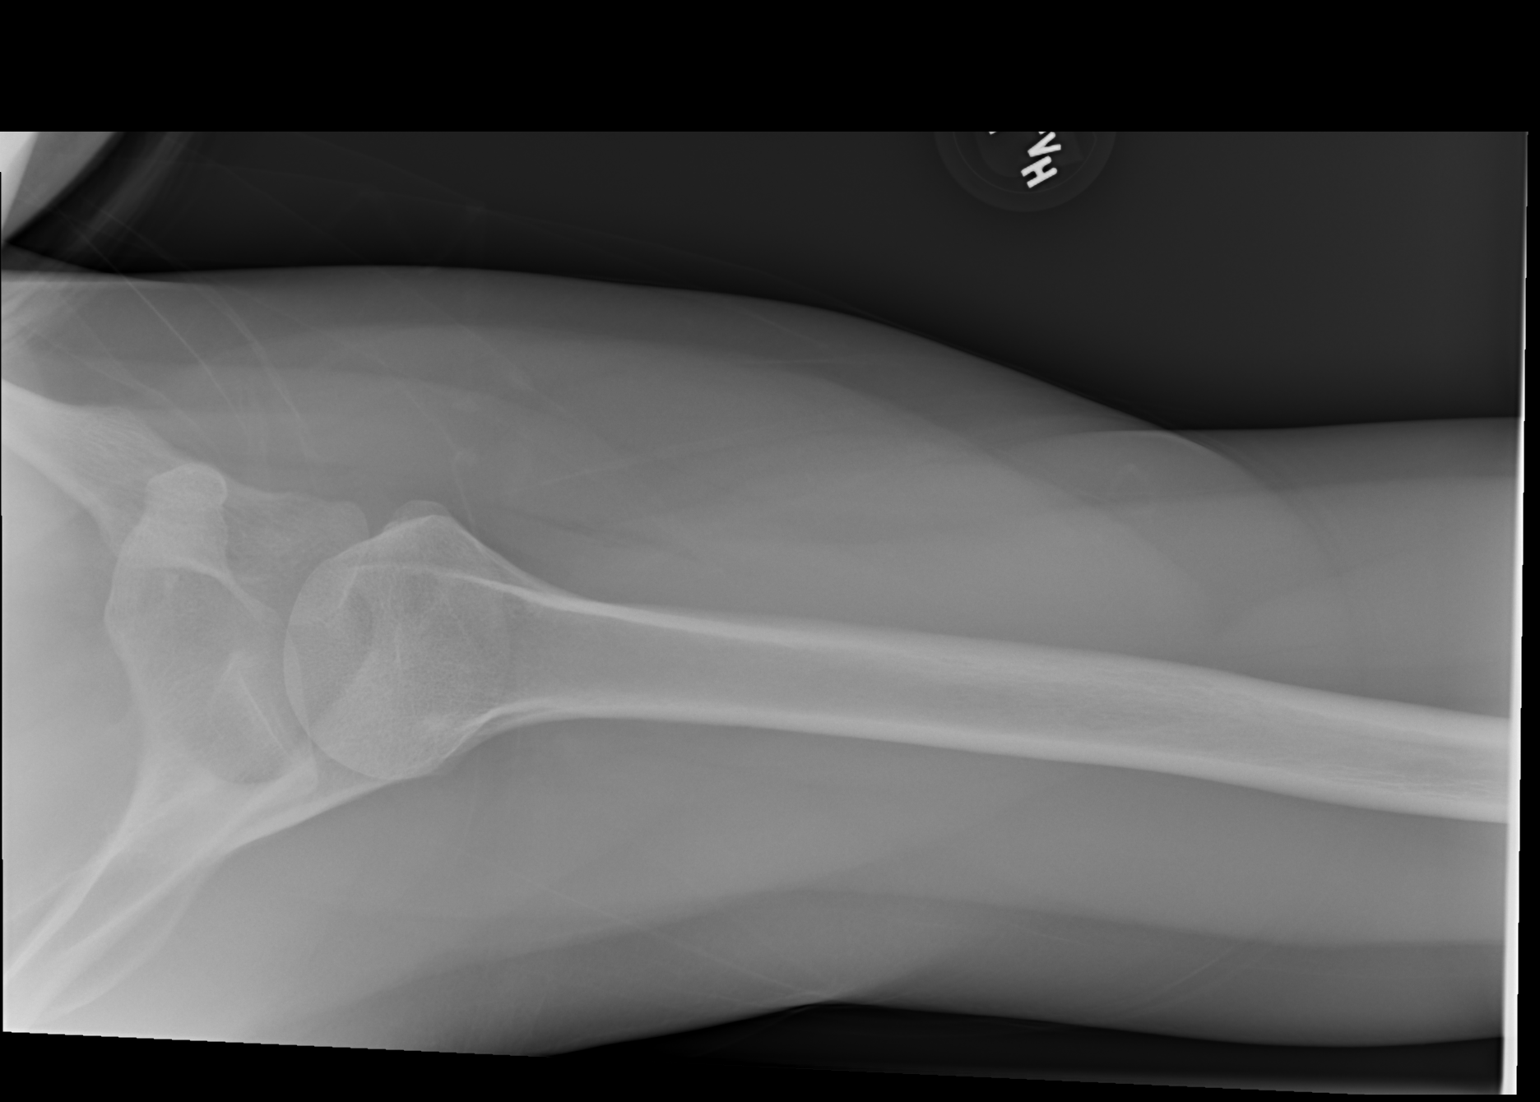

[3 of 3 positions shown; findings below may reference images not displayed]

FINDINGS: Bone mineralization is within normal limits. There is no evidence of
fracture or dislocation. There is no evidence of arthropathy or
other focal bone abnormality. Visible right ribs and chest appear
stable and negative.
IMPRESSION: Negative.

## 2021-12-03 ENCOUNTER — Ambulatory Visit (INDEPENDENT_AMBULATORY_CARE_PROVIDER_SITE_OTHER): Payer: Medicaid Other | Admitting: Obstetrics & Gynecology

## 2021-12-03 ENCOUNTER — Encounter: Payer: Self-pay | Admitting: Obstetrics & Gynecology

## 2021-12-03 ENCOUNTER — Other Ambulatory Visit (HOSPITAL_COMMUNITY)
Admission: RE | Admit: 2021-12-03 | Discharge: 2021-12-03 | Disposition: A | Discharge status: Home / Self Care | Payer: Medicaid Other | Source: Ambulatory Visit | Attending: Obstetrics & Gynecology | Admitting: Obstetrics & Gynecology

## 2021-12-03 ENCOUNTER — Other Ambulatory Visit: Payer: Self-pay

## 2021-12-03 DIAGNOSIS — O0990 Supervision of high risk pregnancy, unspecified, unspecified trimester: Secondary | ICD-10-CM | POA: Insufficient documentation

## 2021-12-03 DIAGNOSIS — Z3A20 20 weeks gestation of pregnancy: Secondary | ICD-10-CM

## 2021-12-03 DIAGNOSIS — Z3480 Encounter for supervision of other normal pregnancy, unspecified trimester: Secondary | ICD-10-CM

## 2021-12-03 HISTORY — DX: Encounter for supervision of other normal pregnancy, unspecified trimester: Z34.80

## 2021-12-03 MED ORDER — BLOOD PRESSURE KIT DEVI: 1.0000 | 0 refills | Status: DC

## 2021-12-03 MED ORDER — PREPLUS 27-1 MG PO TABS: 1.0000 | ORAL_TABLET | Freq: Every day | ORAL | 13 refills | Status: DC

## 2021-12-03 NOTE — Progress Notes (Signed)
New OB, Late to care, sts difficulty getting Medicaid switched and getting in to be seen. ? ?New OB Intake ? ?I connected with  Christine Orozco on 12/03/21 at  9:55 AM EST in personand verified that I am speaking with the correct person using two identifiers. Nurse is located at [facility] pt is located at [place]. ? ?I discussed the limitations, risks, security and privacy concerns of performing an evaluation and management service by telephone and the availability of in person appointments. I also discussed with the patient that there may be a patient responsible charge related to this service. The patient expressed understanding and agreed to proceed. ? ?I explained I am completing New OB Intake today. We discussed her EDD of 04/16/22 that is based on LMP of november. Pt is G5/P2. I reviewed her allergies, medications, Medical/Surgical/OB history, and appropriate screenings. I informed her of [facility] services. Based on history, this is a/an  pregnancy complicated by Late to care 20 wks  .  ? ?There are no problems to display for this patient. ? ? ?Concerns addressed today ? ?Delivery Plans:  ?Plans to deliver at [facility].  ? ?MyChart/Babyscripts ?MyChart access verified. I explained pt will have some visits in office and some virtually. Babyscripts instructions given and order placed. Patient verifies receipt of registration text/e-mail. Account successfully created and app downloaded. ? ?Blood Pressure Cuff  ?Blood pressure cuff ordered for patient to pick-up from [employer]. Explained after first prenatal appt pt will check weekly and document in Babyscripts. ? ?Weight scale: Patient    have weight scale. Weight scale ordered  ? ?Anatomy US ?Explained first scheduled [place] will be around 19 weeks. Anatomy US scheduled  ? ?Labs ?Discussed Avelina Laine genetic screening with patient. Would like both Panorama and Horizon drawn at new OB visit. Routine prenatal labs needed. ? ?Covid Vaccine ?Patient has covid vaccine.   ? ?Social Determinants of Health ?Food Insecurity: Patient denies food insecurity. ?WIC Referral: Patient is interested in referral to [facility].  ?Transportation: Patient denies transportation needs. ?Childcare: Discussed no children allowed at ultrasound appointments. Offered childcare services; patient declines childcare services at this time. ? ?First visit review ?I reviewed new OB appt with pt. I explained she will have a pelvic exam, ob bloodwork with genetic screening, and PAP smear. Explained pt will be seen at first visit; encounter routed to appropriate provider. Explained that patient will be seen by pregnancy navigator following visit with provider. [facility] information placed in AVS.  ? ?Harrel Lemon, RN ?12/03/2021  10:01 AM  ?

## 2021-12-03 NOTE — Progress Notes (Signed)
?Subjective:late care  ? ? Christine Orozco is a [id number] [redacted]w[redacted]d being seen today for her first obstetrical visit.  Her obstetrical history is significant for  late prenatal care . Patient does intend to breast feed. Pregnancy history fully reviewed. ? ?Patient reports heartburn and nausea. ? ?Vitals:  ? 12/03/21 0956  ?Weight: 193 lb (87.5 kg)  ? ? ?HISTORY: ?OB History  ?Gravida Para Term Preterm AB Living  ?5 2 2  0 2 2  ?SAB IAB Ectopic Multiple Live Births  ?2 0 0 0 2  ?  ?# Outcome Date GA Lbr Len/2nd Weight Sex Delivery Anes PTL Lv  ?5 Current           ?4 SAB 01/16/19          ?3 Term 11/22/17 [redacted]w[redacted]d 12:30 / 05:52 6 lb 13.3 oz (3.099 kg) M Vag-Spont EPI  LIV  ?2 SAB 02/25/09 [redacted]w[redacted]d         ?1 Term 10/26/07 [redacted]w[redacted]d  5 lb 10 oz (2.551 kg) M Vag-Vacuum EPI  LIV  ?  ?Obstetric Comments  ?Short cervix w/ 2019 pregnancy  ? ?Past Medical History:  ?Diagnosis Date  ? Hepatitis B antibody positive   ? Miscarriage   ? Supervision of other normal pregnancy, antepartum 12/03/2021  ?  Nursing Staff Provider Office Location   Dating  6-7 week 02/02/2022 [facility] Model [ ]  Traditional [ ]  Centering [ ]  Mom-Baby Dyad   Language   Anatomy [facility]   Flu Vaccine   Genetic/Carrier Screen  NIPS:    AFP:    Horizon: TDaP Vaccine    Hgb A1C or  GTT Early  Third trimester  COVID Vaccine    LAB RESULTS  Rhogam   Blood Type    Baby Feeding Plan  Antibody   Contraception  Rubella   Circumcision  RPR    Pedia  ? Vaginal Pap smear, abnormal   ? ?Past Surgical History:  ?Procedure Laterality Date  ? CERVICAL BIOPSY  W/ LOOP ELECTRODE EXCISION    ? extraction of wisdom teeth    ? ?Family History  ?Adopted: Yes  ?Problem Relation Age of Onset  ? Hepatitis B Mother   ? ? ? ?Exam  ? ? ?Uterus:     ?Pelvic Exam:   ? Perineum: No Hemorrhoids  ? Vulva: normal  ? Vagina:  normal mucosa  ? pH:    ? Cervix: no lesions  ? Adnexa: not evaluated  ? Bony Pelvis: average  ?System: Breast:  normal appearance, no masses or tenderness  ? Skin: normal coloration and turgor, no  rashes ?  ? Neurologic: oriented, normal mood  ? Extremities: normal strength, tone, and muscle mass  ? HEENT PERRLA  ? Mouth/Teeth mucous membranes moist, pharynx normal without lesions  ? Neck supple  ? Cardiovascular: regular rate and rhythm, no murmurs or gallops  ? Respiratory:  appears well, vitals normal, no respiratory distress, acyanotic, normal RR, neck free of mass or lymphadenopathy, chest clear, no wheezing, crepitations, rhonchi, normal symmetric air entry  ? Abdomen: soft, non-tender; bowel sounds normal; no masses,  no organomegaly  ? Urinary: urethral meatus normal  ? ? ?  ?Assessment:  ? ? Pregnancy: ?Patient Active Problem List  ? Diagnosis Date Noted  ? Supervision of other normal pregnancy, antepartum 12/03/2021  ? ?  ? ?  ?Plan:  ? ?  ?Initial labs drawn. ?Prenatal vitamins. ?Problem list reviewed and updated. ?Genetic Screening discussed : ordered. ? Ultrasound discussed; fetal  survey: ordered. ? Follow up in 4 weeks. ?50% of 30 min visit spent on counseling and coordination of care.  ?Plans BTL  ? ?Scheryl Darter ?12/03/2021 ? ? ?

## 2021-12-04 LAB — CBC/D/PLT+RPR+RH+ABO+RUBIGG...
Antibody Screen: NEGATIVE
Basophils Absolute: 0.1 10*3/uL (ref 0.0–0.2)
Basos: 1 %
EOS (ABSOLUTE): 0.1 10*3/uL (ref 0.0–0.4)
Eos: 1 %
HCV Ab: NONREACTIVE
HIV Screen 4th Generation wRfx: NONREACTIVE
Hematocrit: 37.2 % (ref 34.0–46.6)
Hemoglobin: 12.5 g/dL (ref 11.1–15.9)
Hepatitis B Surface Ag: NEGATIVE
Immature Grans (Abs): 0.2 10*3/uL — ABNORMAL HIGH (ref 0.0–0.1)
Immature Granulocytes: 2 %
Lymphocytes Absolute: 2.1 10*3/uL (ref 0.7–3.1)
Lymphs: 17 %
MCH: 29 pg (ref 26.6–33.0)
MCHC: 33.6 g/dL (ref 31.5–35.7)
MCV: 86 fL (ref 79–97)
Monocytes Absolute: 0.6 10*3/uL (ref 0.1–0.9)
Monocytes: 5 %
Neutrophils Absolute: 9.4 10*3/uL — ABNORMAL HIGH (ref 1.4–7.0)
Neutrophils: 74 %
Platelets: 259 10*3/uL (ref 150–450)
RBC: 4.31 x10E6/uL (ref 3.77–5.28)
RDW: 13.1 % (ref 11.7–15.4)
RPR Ser Ql: NONREACTIVE
Rh Factor: NEGATIVE
Rubella Antibodies, IGG: 1.17 index (ref >0.99)
WBC: 12.4 10*3/uL — ABNORMAL HIGH (ref 3.4–10.8)

## 2021-12-04 LAB — HEMOGLOBIN A1C
Est. average glucose Bld gHb Est-mCnc: 111 mg/dL
Hgb A1c MFr Bld: 5.5 % (ref 4.8–5.6)

## 2021-12-04 LAB — HCV INTERPRETATION

## 2021-12-05 LAB — URINE CULTURE, OB REFLEX

## 2021-12-05 LAB — CULTURE, OB URINE

## 2021-12-06 LAB — CERVICOVAGINAL ANCILLARY ONLY
Chlamydia: NEGATIVE
Comment: NEGATIVE
Comment: NORMAL
Neisseria Gonorrhea: NEGATIVE

## 2021-12-07 LAB — CYTOLOGY - PAP
Adequacy: ABSENT
Comment: NEGATIVE
Diagnosis: NEGATIVE
High risk HPV: NEGATIVE

## 2021-12-13 ENCOUNTER — Encounter: Payer: Self-pay | Admitting: Obstetrics & Gynecology

## 2021-12-16 ENCOUNTER — Encounter: Payer: Self-pay | Admitting: Obstetrics & Gynecology

## 2021-12-24 ENCOUNTER — Ambulatory Visit: Payer: Medicaid Other | Admitting: *Deleted

## 2021-12-24 ENCOUNTER — Other Ambulatory Visit: Payer: Self-pay

## 2021-12-24 ENCOUNTER — Ambulatory Visit: Payer: Medicaid Other | Attending: Obstetrics & Gynecology

## 2021-12-24 ENCOUNTER — Other Ambulatory Visit: Payer: Self-pay | Admitting: *Deleted

## 2021-12-24 VITALS — BP 114/80 | HR 85

## 2021-12-24 DIAGNOSIS — Z3A24 24 weeks gestation of pregnancy: Secondary | ICD-10-CM | POA: Diagnosis not present

## 2021-12-24 DIAGNOSIS — Z3687 Encounter for antenatal screening for uncertain dates: Secondary | ICD-10-CM | POA: Insufficient documentation

## 2021-12-24 DIAGNOSIS — O99212 Obesity complicating pregnancy, second trimester: Secondary | ICD-10-CM | POA: Insufficient documentation

## 2021-12-24 DIAGNOSIS — Z3480 Encounter for supervision of other normal pregnancy, unspecified trimester: Secondary | ICD-10-CM | POA: Diagnosis not present

## 2021-12-24 DIAGNOSIS — O0932 Supervision of pregnancy with insufficient antenatal care, second trimester: Secondary | ICD-10-CM

## 2021-12-24 DIAGNOSIS — O321XX0 Maternal care for breech presentation, not applicable or unspecified: Secondary | ICD-10-CM | POA: Insufficient documentation

## 2021-12-24 DIAGNOSIS — Z363 Encounter for antenatal screening for malformations: Secondary | ICD-10-CM | POA: Diagnosis not present

## 2021-12-24 DIAGNOSIS — Z362 Encounter for other antenatal screening follow-up: Secondary | ICD-10-CM

## 2021-12-31 ENCOUNTER — Ambulatory Visit (INDEPENDENT_AMBULATORY_CARE_PROVIDER_SITE_OTHER): Payer: Medicaid Other | Admitting: Obstetrics

## 2021-12-31 ENCOUNTER — Ambulatory Visit (INDEPENDENT_AMBULATORY_CARE_PROVIDER_SITE_OTHER): Payer: Medicaid Other | Admitting: Licensed Clinical Social Worker

## 2021-12-31 ENCOUNTER — Encounter: Payer: Self-pay | Admitting: Obstetrics

## 2021-12-31 VITALS — BP 136/84 | HR 96 | Wt 200.3 lb

## 2021-12-31 DIAGNOSIS — Z3A25 25 weeks gestation of pregnancy: Secondary | ICD-10-CM

## 2021-12-31 DIAGNOSIS — F4323 Adjustment disorder with mixed anxiety and depressed mood: Secondary | ICD-10-CM | POA: Diagnosis not present

## 2021-12-31 DIAGNOSIS — N393 Stress incontinence (female) (male): Secondary | ICD-10-CM

## 2021-12-31 DIAGNOSIS — Z6791 Unspecified blood type, Rh negative: Secondary | ICD-10-CM

## 2021-12-31 DIAGNOSIS — Z3480 Encounter for supervision of other normal pregnancy, unspecified trimester: Secondary | ICD-10-CM | POA: Diagnosis not present

## 2021-12-31 DIAGNOSIS — O26899 Other specified pregnancy related conditions, unspecified trimester: Secondary | ICD-10-CM | POA: Diagnosis not present

## 2021-12-31 NOTE — Progress Notes (Addendum)
Subjective:  ?Christine Orozco is a 36 y.o. [phone] at [redacted]w[redacted]d being seen today for ongoing prenatal care.  She is currently monitored for the following issues for this low-risk pregnancy and has Supervision of other normal pregnancy, antepartum on their problem list. ? ?Patient reports backache, heartburn, and leaking of urine with cough, sneeze, etc .  Contractions: Not present. Vag. Bleeding: None.  Movement: Present. Denies leaking of fluid.  ? ?The following portions of the patient's history were reviewed and updated as appropriate: allergies, current medications, past family history, past medical history, past social history, past surgical history and problem list. Problem list updated. ? ?Objective:  ? ?Vitals:  ? 12/31/21 1029  ?BP: 136/84  ?Pulse: 96  ?Weight: 200 lb 4.8 oz (90.9 kg)  ? ? ?Fetal Status: Fetal Heart Rate (bpm): 147   Movement: Present    ? ?General:  Alert, oriented and cooperative. Patient is in no acute distress.  ?Skin: Skin is warm and dry. No rash noted.   ?Cardiovascular: Normal heart rate noted  ?Respiratory: Normal respiratory effort, no problems with respiration noted  ?Abdomen: Soft, gravid, appropriate for gestational age. Pain/Pressure: Absent     ?Pelvic:  Cervical exam deferred        ?Extremities: Normal range of motion.  Edema: Trace  ?Mental Status: Normal mood and affect. Normal behavior. Normal judgment and thought content.  ? ?Urinalysis:     ? ?Assessment and Plan:  ?Pregnancy: [id number] at [redacted]w[redacted]d ? ?1. Supervision of other normal pregnancy, antepartum ? ?2. Rh negative state in antepartum period ?- Rhogam at 28 weeks and postpartum ? ?3. SUI (stress urinary incontinence, female) ?Rx: ?- Ambulatory referral to Urogynecology  ? ? ?Preterm labor symptoms and general obstetric precautions including but not limited to vaginal bleeding, contractions, leaking of fluid and fetal movement were reviewed in detail with the patient. ?Please refer to After Visit Summary for other  counseling recommendations.  ? ?Return in about 3 weeks (around 01/21/2022) for ROB, 2 hour OGTT, Rhogam. ? ? ?Brock Bad, MD  ?12/31/21  ?

## 2022-01-04 NOTE — BH Specialist Note (Signed)
Integrated Behavioral Health Initial In-Person Visit ? ?MRN: [id number] ?Name: Christine Orozco ? ?Number of [facility] Clinician visits: 1 ?Session Start time:   10:10am ?Session End time: 10:29am ?Total time in minutes: 19 mins in person at [facility]  ? ?Types of Service: [place] (BHI) ? ?Interpretor:No. Interpretor Name and Language: none ? ? Warm Hand Off Completed. ?  ? ?  ? ? ?Subjective: ?Christine Orozco is a 36 y.o. female accompanied by n/a ?Patient was referred by Dr. Roselie Awkward for adjustment. ?Patient reports the following symptoms/concerns: depressed mood ?Duration of problem: approx one month; Severity of problem: mild ? ?Objective: ?Mood: good and Affect: Appropriate ?Risk of harm to self or others: No plan to harm self or others ? ?Life Context: ?Family and Social: lives with children ?School/Work: n/a ?Self-Care: n/a ?Life Changes: n/a ? ?Patient and/or Family's Strengths/Protective Factors: ?Concrete supports in place (healthy food, safe environments, etc.) ? ?Goals Addressed: ?Patient will: ?Reduce symptoms of: depression ?Increase knowledge and/or ability of: coping skills  ?Demonstrate ability to: Increase adequate support systems for patient/family ? ?Progress towards Goals: ?Ongoing ? ?Interventions: ?Interventions utilized: Motivational Interviewing  ?Standardized Assessments completed: PHQ 9 ? ?Patient and/or Family Response: Ms. Thau responded well to visit.  ? ?Assessment: ?Patient currently experiencing adjustment disorder with mixed episode ?  ?Patient may benefit from integrated behavioral health. ? ?Plan: ?Follow up with behavioral health clinician on : prn ?Behavioral recommendations: relaxation techniques and mindfulness, communicate needs with support person, prioritize rest and journal writing to identify triggers.  ?Referral(s): [place] (In Clinic) ?"From scale of 1-10, how likely are you to follow plan?":    ? ?Lynnea Ferrier, LCSW ? ? ? ? ? ? ? ? ?

## 2022-01-06 ENCOUNTER — Other Ambulatory Visit: Payer: Self-pay

## 2022-01-06 ENCOUNTER — Inpatient Hospital Stay (HOSPITAL_COMMUNITY)
Admission: AD | Admit: 2022-01-06 | Discharge: 2022-01-18 | DRG: 788 | Disposition: A | Discharge status: Home / Self Care | Payer: Medicaid Other | Attending: Obstetrics and Gynecology | Admitting: Obstetrics and Gynecology

## 2022-01-06 ENCOUNTER — Encounter (HOSPITAL_COMMUNITY): Payer: Self-pay | Admitting: Family Medicine

## 2022-01-06 DIAGNOSIS — Z880 Allergy status to penicillin: Secondary | ICD-10-CM

## 2022-01-06 DIAGNOSIS — Z3A26 26 weeks gestation of pregnancy: Secondary | ICD-10-CM | POA: Diagnosis not present

## 2022-01-06 DIAGNOSIS — Z6791 Unspecified blood type, Rh negative: Secondary | ICD-10-CM | POA: Diagnosis not present

## 2022-01-06 DIAGNOSIS — Z3A36 36 weeks gestation of pregnancy: Secondary | ICD-10-CM | POA: Diagnosis not present

## 2022-01-06 DIAGNOSIS — O321XX0 Maternal care for breech presentation, not applicable or unspecified: Secondary | ICD-10-CM | POA: Diagnosis present

## 2022-01-06 DIAGNOSIS — O09529 Supervision of elderly multigravida, unspecified trimester: Secondary | ICD-10-CM

## 2022-01-06 DIAGNOSIS — O42112 Preterm premature rupture of membranes, onset of labor more than 24 hours following rupture, second trimester: Secondary | ICD-10-CM | POA: Diagnosis not present

## 2022-01-06 DIAGNOSIS — O26893 Other specified pregnancy related conditions, third trimester: Secondary | ICD-10-CM | POA: Diagnosis present

## 2022-01-06 DIAGNOSIS — O42919 Preterm premature rupture of membranes, unspecified as to length of time between rupture and onset of labor, unspecified trimester: Secondary | ICD-10-CM | POA: Diagnosis not present

## 2022-01-06 DIAGNOSIS — Z87891 Personal history of nicotine dependence: Secondary | ICD-10-CM

## 2022-01-06 DIAGNOSIS — O42912 Preterm premature rupture of membranes, unspecified as to length of time between rupture and onset of labor, second trimester: Secondary | ICD-10-CM | POA: Diagnosis present

## 2022-01-06 DIAGNOSIS — Z3A27 27 weeks gestation of pregnancy: Secondary | ICD-10-CM | POA: Diagnosis not present

## 2022-01-06 DIAGNOSIS — Z23 Encounter for immunization: Secondary | ICD-10-CM

## 2022-01-06 DIAGNOSIS — O26899 Other specified pregnancy related conditions, unspecified trimester: Secondary | ICD-10-CM

## 2022-01-06 DIAGNOSIS — O26892 Other specified pregnancy related conditions, second trimester: Secondary | ICD-10-CM

## 2022-01-06 DIAGNOSIS — O0990 Supervision of high risk pregnancy, unspecified, unspecified trimester: Secondary | ICD-10-CM

## 2022-01-06 HISTORY — DX: Preterm premature rupture of membranes, onset of labor more than 24 hours following rupture, second trimester: O42.112

## 2022-01-06 LAB — URINALYSIS, ROUTINE W REFLEX MICROSCOPIC
Bilirubin Urine: NEGATIVE
Glucose, UA: NEGATIVE mg/dL
Ketones, ur: NEGATIVE mg/dL
Nitrite: NEGATIVE
Protein, ur: 100 mg/dL — AB
RBC / HPF: >50 RBC/hpf — ABNORMAL HIGH (ref 0–5)
Specific Gravity, Urine: 1.026 (ref 1.005–1.030)
WBC, UA: >50 WBC/hpf — ABNORMAL HIGH (ref 0–5)
pH: 6 (ref 5.0–8.0)

## 2022-01-06 LAB — WET PREP, GENITAL
Clue Cells Wet Prep HPF POC: NONE SEEN
Sperm: NONE SEEN
Trich, Wet Prep: NONE SEEN
WBC, Wet Prep HPF POC: >=10 — AB (ref <10)
Yeast Wet Prep HPF POC: NONE SEEN

## 2022-01-06 LAB — CBC
HCT: 34.3 % — ABNORMAL LOW (ref 36.0–46.0)
Hemoglobin: 11.4 g/dL — ABNORMAL LOW (ref 12.0–15.0)
MCH: 28.9 pg (ref 26.0–34.0)
MCHC: 33.2 g/dL (ref 30.0–36.0)
MCV: 87.1 fL (ref 80.0–100.0)
Platelets: 274 10*3/uL (ref 150–400)
RBC: 3.94 MIL/uL (ref 3.87–5.11)
RDW: 13.3 % (ref 11.5–15.5)
WBC: 23 10*3/uL — ABNORMAL HIGH (ref 4.0–10.5)
nRBC: 0 % (ref 0.0–0.2)

## 2022-01-06 LAB — POCT FERN TEST: POCT Fern Test: POSITIVE

## 2022-01-06 LAB — TYPE AND SCREEN
ABO/RH(D): O NEG
Antibody Screen: NEGATIVE

## 2022-01-06 MED ORDER — SODIUM CHLORIDE 0.9 % IV SOLN
2.0000 g | Freq: Four times a day (QID) | INTRAVENOUS | Status: AC
  Administered 2022-01-06 – 2022-01-08 (×8): 2 g via INTRAVENOUS
  Filled 2022-01-06 (×8): qty 2000

## 2022-01-06 MED ORDER — AZITHROMYCIN 250 MG PO TABS
1000.0000 mg | ORAL_TABLET | Freq: Once | ORAL | Status: AC
  Administered 2022-01-06: 1000 mg via ORAL
  Filled 2022-01-06: qty 4

## 2022-01-06 MED ORDER — SODIUM CHLORIDE 0.9% FLUSH
3.0000 mL | Freq: Two times a day (BID) | INTRAVENOUS | Status: DC
  Administered 2022-01-07 – 2022-01-16 (×18): 3 mL via INTRAVENOUS

## 2022-01-06 MED ORDER — ONDANSETRON 4 MG PO TBDP
4.0000 mg | ORAL_TABLET | Freq: Four times a day (QID) | ORAL | Status: DC | PRN
Start: 2022-01-06 — End: 2022-01-16
  Administered 2022-01-06: 4 mg via ORAL
  Filled 2022-01-06: qty 1

## 2022-01-06 MED ORDER — ACETAMINOPHEN 325 MG PO TABS
650.0000 mg | ORAL_TABLET | ORAL | Status: DC | PRN
  Administered 2022-01-06 – 2022-01-16 (×8): 650 mg via ORAL
  Filled 2022-01-06 (×8): qty 2

## 2022-01-06 MED ORDER — SODIUM CHLORIDE 0.9 % IV SOLN: 250.0000 mL | INTRAVENOUS | Status: DC | PRN

## 2022-01-06 MED ORDER — CALCIUM CARBONATE ANTACID 500 MG PO CHEW
2.0000 | CHEWABLE_TABLET | ORAL | Status: DC | PRN
  Administered 2022-01-08: 400 mg via ORAL
  Filled 2022-01-06: qty 2

## 2022-01-06 MED ORDER — MAGNESIUM SULFATE BOLUS VIA INFUSION
4.0000 g | Freq: Once | INTRAVENOUS | Status: AC
  Administered 2022-01-06: 4 g via INTRAVENOUS
  Filled 2022-01-06: qty 1000

## 2022-01-06 MED ORDER — AMOXICILLIN 500 MG PO CAPS
500.0000 mg | ORAL_CAPSULE | Freq: Three times a day (TID) | ORAL | Status: AC
  Administered 2022-01-08 – 2022-01-13 (×15): 500 mg via ORAL
  Filled 2022-01-06 (×16): qty 1

## 2022-01-06 MED ORDER — ZOLPIDEM TARTRATE 5 MG PO TABS
5.0000 mg | ORAL_TABLET | Freq: Every evening | ORAL | Status: DC | PRN
  Administered 2022-01-09 – 2022-01-14 (×6): 5 mg via ORAL
  Filled 2022-01-06 (×6): qty 1

## 2022-01-06 MED ORDER — LACTATED RINGERS IV SOLN: INTRAVENOUS | Status: DC

## 2022-01-06 MED ORDER — BETAMETHASONE SOD PHOS & ACET 6 (3-3) MG/ML IJ SUSP
12.0000 mg | INTRAMUSCULAR | Status: AC
  Administered 2022-01-06 – 2022-01-07 (×2): 12 mg via INTRAMUSCULAR
  Filled 2022-01-06 (×2): qty 5

## 2022-01-06 MED ORDER — MAGNESIUM SULFATE 40 GM/1000ML IV SOLN
2.0000 g/h | INTRAVENOUS | Status: DC
  Filled 2022-01-06: qty 1000

## 2022-01-06 MED ORDER — PRENATAL MULTIVITAMIN CH
1.0000 | ORAL_TABLET | Freq: Every day | ORAL | Status: DC
  Administered 2022-01-07 – 2022-01-16 (×10): 1 via ORAL
  Filled 2022-01-06 (×10): qty 1

## 2022-01-06 MED ORDER — RHO D IMMUNE GLOBULIN 1500 UNIT/2ML IJ SOSY
300.0000 ug | PREFILLED_SYRINGE | Freq: Once | INTRAMUSCULAR | Status: AC
  Administered 2022-01-06: 300 ug via INTRAMUSCULAR
  Filled 2022-01-06: qty 2

## 2022-01-06 MED ORDER — SODIUM CHLORIDE 0.9% FLUSH: 3.0000 mL | INTRAVENOUS | Status: DC | PRN

## 2022-01-06 MED ORDER — DOCUSATE SODIUM 100 MG PO CAPS
100.0000 mg | ORAL_CAPSULE | Freq: Every day | ORAL | Status: DC
  Administered 2022-01-07 – 2022-01-16 (×10): 100 mg via ORAL
  Filled 2022-01-06 (×10): qty 1

## 2022-01-06 NOTE — MAU Note (Signed)
Discussed with provider and will hold mag until admission ?

## 2022-01-06 NOTE — MAU Note (Addendum)
...  Christine Orozco is a 36 y.o. at [redacted]w[redacted]d here in MAU reporting: Vaginal spotting that began around an hour ago and intermittent left mid abdominal pain that has been on going throughout the day. She states she used the restroom and noticed a light pink streak on her incontinence pad. She states she had intercourse last night. Denies LOF but endorses vaginal itching today. DFM. Patient states "She usually doesn't move a lot during the day but she moves a lot around midnight but I haven't felt her move at all today."  ? ?Also endorsing a HA for two days. She states she thinks it is related to her allergies. ? ?Pain score:  ?3/10 lower abdomen ?3/10 HA - anterior ? ?FHT: 150 doppler ?Lab orders placed from triage: UA ? ?

## 2022-01-06 NOTE — Progress Notes (Signed)
Pt informed that the ultrasound is considered a limited OB ultrasound and is not intended to be a complete ultrasound exam.  Patient also informed that the ultrasound is not being completed with the intent of assessing for fetal or placental anomalies or any pelvic abnormalities.  Explained that the purpose of today's ultrasound is to assess for  presentation.  Patient acknowledges the purpose of the exam and the limitations of the study.    [place]  Christine Pavon, NP  

## 2022-01-06 NOTE — H&P (Signed)
FACULTY PRACTICE ANTEPARTUM ADMISSION HISTORY AND PHYSICAL NOTE ? ? ?History of Present Illness: ?Christine Orozco is a 36 y.o. [phone] at 21w2dadmitted for rupture of membranes.  Patient reports pink spotting after intercourse yesterday. Since 4 pm has noticed watery discharge as well that continues to leak. Feels tightening about 2 times per hour but doesn't feel contractions or abdominal pain. Denies fever. No fetal movement today.  ? ?Patient reports the fetal movement as decreased . ?Patient reports uterine contraction  activity as none. ?Patient reports  vaginal bleeding as spotting. ?Patient describes fluid per vagina as Clear. ?Fetal presentation is breech. ? ?Patient Active Problem List  ? Diagnosis Date Noted  ? Preterm premature rupture of membranes (PPROM) delivered, current hospitalization 01/06/2022  ? Supervision of other normal pregnancy, antepartum 12/03/2021  ? Rh negative status during pregnancy 06/07/2017  ? ? ?Past Medical History:  ?Diagnosis Date  ? Anxiety   ? Hepatitis B antibody positive   ? Miscarriage   ? Vaginal Pap smear, abnormal   ? ? ?Past Surgical History:  ?Procedure Laterality Date  ? CERVICAL BIOPSY  W/ LOOP ELECTRODE EXCISION    ? WISDOM TOOTH EXTRACTION    ? ? ?OB History  ?Gravida Para Term Preterm AB Living  ?5 2 2  0 2 2  ?SAB IAB Ectopic Multiple Live Births  ?2 0 0 0 2  ?  ?# Outcome Date GA Lbr Len/2nd Weight Sex Delivery Anes PTL Lv  ?5 Current           ?4 SAB 01/16/19          ?3 Term 11/22/17 332w2d2:30 / 05:52 3099 g M Vag-Spont EPI  LIV  ?2 SAB 02/25/09 7w1w0d      ?1 Term 10/26/07 37w48w5d51 g M Vag-Vacuum EPI  LIV  ?  ?Obstetric Comments  ?Short cervix w/ 2019 pregnancy  ? ? ?Social History  ? ?Socioeconomic History  ? Marital status: Married  ?  Spouse name: RodrGraciela HusbandsNumber of children: 2  ? Years of education: Not on file  ? Highest education level: Not on file  ?Occupational History  ? Not on file  ?Tobacco Use  ? Smoking status: Former  ?  Packs/day:  0.25  ?  Types: Cigarettes  ?  Quit date: 10/2021  ?  Years since quitting: 0.2  ? Smokeless tobacco: Never  ? Tobacco comments:  ?  States she quit in 2020  ?Vaping Use  ? Vaping Use: Former  ? Quit date: 10/03/2021  ?Substance and Sexual Activity  ? Alcohol use: Not Currently  ?  Comment: not while preg  ? Drug use: Not Currently  ?  Types: Marijuana  ?  Comment: last used three months ago as of 01/06/2022  ? Sexual activity: Yes  ?  Partners: Male  ?  Birth control/protection: None  ?Other Topics Concern  ? Not on file  ?Social History Narrative  ? Not on file  ? ?Social Determinants of Health  ? ?Financial Resource Strain: Not on file  ?Food Insecurity: Not on file  ?Transportation Needs: Not on file  ?Physical Activity: Not on file  ?Stress: Not on file  ?Social Connections: Not on file  ? ? ?Family History  ?Adopted: Yes  ?Problem Relation Age of Onset  ? Hepatitis B Mother   ? Diabetes Sister   ? ? ?Allergies  ?Allergen Reactions  ? Beef-Derived Products Nausea And Vomiting and Other (See Comments)  ?  Actively vomits. Alpha-gal allergy.    ? Ultram [Tramadol] Other (See Comments)  ?  Hallucinate and heart stop  ? Augmentin [Amoxicillin-Pot Clavulanate] Other (See Comments)  ?  Childhood, pt states she can take penicillin ?Has patient had a PCN reaction causing immediate rash, facial/tongue/throat swelling, SOB or lightheadedness with hypotension: No ?Has patient had a PCN reaction causing severe rash involving mucus membranes or skin necrosis: No ?Has patient had a PCN reaction that required hospitalization No ?Has patient had a PCN reaction occurring within the last 10 years: No ?If all of the above answers are "NO", then may proceed with Cephalosporin use. ?  ? Sudafed [Pseudoephedrine] Palpitations and Other (See Comments)  ?  hyperactivity   ? ? ?Medications Prior to Admission  ?Medication Sig Dispense Refill Last Dose  ? Blood Pressure Monitoring (BLOOD PRESSURE KIT) DEVI 1 Device by Does not apply route  once a week. 1 each 0   ? Prenatal Vit-Fe Fumarate-FA (PREPLUS) 27-1 MG TABS Take 1 tablet by mouth daily. 30 tablet 13   ? ? ?Review of Systems - History obtained from the patient ? ?Vitals:  BP 132/84 (BP Location: Right Arm)   Temp 98.4 ?F (36.9 ?C) (Oral)   Resp 19   Ht 5' 3"  (1.6 m)   Wt 88 kg   LMP 07/03/2021   SpO2 100%   BMI 34.35 kg/m?  ?Physical Examination: ?CONSTITUTIONAL: Well-developed, well-nourished female in no acute distress.  ?HENT:  Normocephalic, atraumatic, External right and left ear normal. Oropharynx is clear and moist ?EYES: Conjunctivae and EOM are normal. Pupils are equal, round, and reactive to light. No scleral icterus.  ?NECK: Normal range of motion, supple, no masses ?SKIN: Skin is warm and dry. No rash noted. Not diaphoretic. No erythema. No pallor. ?[place]: Alert and oriented to person, place, and time. Normal reflexes, muscle tone coordination. No cranial nerve deficit noted. ?PSYCHIATRIC: Normal mood and affect. Normal behavior. Normal judgment and thought content. ?CARDIOVASCULAR: Normal heart rate noted, regular rhythm ?RESPIRATORY: Effort and breath sounds normal, no problems with respiration noted ?ABDOMEN: Soft, nontender, nondistended, gravid. ?MUSCULOSKELETAL: Normal range of motion. No edema and no tenderness. 2+ distal pulses. ?PELVIC: Sterile spec exam: pooling of clear fluid, no blood, cervix visually closed ? ?Cervix: Presentation:  (breech by bedside u/s per E. Ariday Brinker,NP) ?Membranes:ruptured, clear fluid ?Fetal Monitoring:Baseline: 150 bpm, Variability: Good {> 6 bpm), Accelerations: Non-reactive but appropriate for gestational age, and Decelerations: Absent ?Tocometer: Flat ? ?Labs:  ?Results for orders placed or performed during the hospital encounter of 01/06/22 (from the past 24 hour(s))  ?Urinalysis, Routine w reflex microscopic Urine, Clean Catch  ? Collection Time: 01/06/22  5:27 PM  ?Result Value Ref Range  ? Color, Urine AMBER (A) YELLOW  ?  APPearance CLOUDY (A) CLEAR  ? Specific Gravity, Urine 1.026 1.005 - 1.030  ? pH 6.0 5.0 - 8.0  ? Glucose, UA NEGATIVE NEGATIVE mg/dL  ? Hgb urine dipstick MODERATE (A) NEGATIVE  ? Bilirubin Urine NEGATIVE NEGATIVE  ? Ketones, ur NEGATIVE NEGATIVE mg/dL  ? Protein, ur 100 (A) NEGATIVE mg/dL  ? Nitrite NEGATIVE NEGATIVE  ? Leukocytes,Ua MODERATE (A) NEGATIVE  ? RBC / HPF >50 (H) 0 - 5 RBC/hpf  ? WBC, UA >50 (H) 0 - 5 WBC/hpf  ? Bacteria, UA FEW (A) NONE SEEN  ? Squamous Epithelial / LPF 11-20 0 - 5  ? Mucus PRESENT   ?Rh IG workup (includes ABO/Rh)  ? Collection Time: 01/06/22  6:00 PM  ?Result  Value Ref Range  ? Gestational Age(Wks) 52   ? ABO/RH(D)    ?  O NEG ?Performed at [facility], [place] [address], [place], [place] [address] ?  ? Antibody Screen PENDING   ?Wet prep, genital  ? Collection Time: 01/06/22  6:08 PM  ? Specimen: Vaginal/Rectal  ?Result Value Ref Range  ? Yeast Wet Prep HPF POC NONE SEEN NONE SEEN  ? Trich, Wet Prep NONE SEEN NONE SEEN  ? Clue Cells Wet Prep HPF POC NONE SEEN NONE SEEN  ? WBC, Wet Prep HPF POC >=10 (A) <10  ? Sperm NONE SEEN   ?POCT fern test  ? Collection Time: 01/06/22  6:11 PM  ?Result Value Ref Range  ? POCT Fern Test Positive = ruptured amniotic membanes   ? ? ?Imaging Studies: ?No results found. ? ? ?Assessment and Plan: ?1. Preterm premature rupture of membranes (PPROM) with unknown onset of labor  ?-wet prep, GC/CT, & GBS collected ?-BMZ ?-Magnesium  ?-antibiotics  ?2. Rh negative status during pregnancy in second trimester  ?-rhogam given in MAU  ?3. [redacted] weeks gestation of pregnancy   ? ? ?Jorje Guild, NP ?01/06/2022 ?6:38 PM ? ?

## 2022-01-06 NOTE — Consult Note (Signed)
? ?Consultation Service: Neonatology  ? ?Dr. Darron Doom has asked for consultation on Christine Orozco regarding the care of a premature infant at [redacted]w[redacted]d Thank you for inviting [place] see this patient.  ? ?Reason for consult:  ?Explain the possible complications, the prognosis, and the care of a premature infant at 287and 2/7 weeks. ? ?Chief complaint: 36y.o. female with a singleton female IUP named "Christine Orozco with an estimated weight of 685 grams (36% on 3/24). Pregnancy has been complicated by PPROM.  Plan is for delivery via ceasarean section/vaginal delivery if possible. ? ?My key findings of this patient's HPI are:  ?I have reviewed the patient's chart and have met with her. The salient ?information is as follows:  ? ?Mom is admitted to L&D for PPROM and not dilated or threatening preterm labor at this time. Monitoring for fetal/maternal distress with no concerns at this time. On latency antibiotics, starting betamethasone, and continuous monitoring.   ? ?Prenatal labs: GBS pending. Other labs reassuring ?  ?Prenatal care:   good ?Pregnancy complications:  PPROM ?Maternal antibiotics: This patient's mother is not on file. ?Maternal Steroids: Betamethasone ?Most recent dose:  4/6  ? ?My recommendations for this patient and my actions included:  ? ?1. In the presence of the ADANAMARIE MINAMIand father of baby, I spent 20 minutes discussing the possible complications and outcomes of prematurity at this gestational age. I discussed specific complications at this gestational age referencing the need for resuscitation at birth due to respiratory distress which may require mechanical ventilation, CPAP, and surfactant administration. In addition infant may require IV fluids pending establishment of enteral feeds (encouraged breast milk feeding), antibiotics for possible sepsis, temperature support, and continuous monitoring. I also discussed the potential risk of complications such as intracranial hemorrhage,  retinopathy, hearing deficit, and chronic lung disease. I discussed this with parents in detail and they expressed an understanding of the risks and complications of prematurity.  ? ?2. I also discussed the expected survival of an infant born at 265 weeks which is (Good). We further discussed that (Few) of the neonates born at this age have profound or severe neurological complications and school difficulties. In addition, (Some) of the neonates born at this age will have some for of mild to moderate neurological complications. She expressed an understanding of this information.  ? ?3. I informed her that the NICU team would be present at the delivery. She agreed that all appropriate medical measures could be taken to resuscitate her infant at the delivery. She also understood that our team will always be available for any questions that come up during their infant's hospitalization and we will continue to partner with their family to support them through this difficult time. Visitation policy was discussed and all questions were addressed. ? ? ?Final Impression:  ?36y.o. female with a singleton female IUP named "Christine Orozco who is threatening to deliver and who now understands the possible complications and prognosis of her infant. The mother agrees with plan for resuscitation and ICU care. ACharnice ZwillingTaylor's questions were answered. She is planning to try and provide breast milk for her infant.  ? ? ?______________________________________________________________________ ? ?Thank you for asking [place] participate in the care of this patient. Please do ?not hesitate to contact [place] if you are aware of any further ways we can be ?of assistance.  ? ?Sincerely,  ?CEdman Circle MD ?Attending Neonatologist ? ? ?I spent ~35 minutes in consultation time, of which  20 minutes was spent in direct face to face counseling.  ? ?

## 2022-01-07 DIAGNOSIS — Z3A36 36 weeks gestation of pregnancy: Secondary | ICD-10-CM

## 2022-01-07 DIAGNOSIS — O42919 Preterm premature rupture of membranes, unspecified as to length of time between rupture and onset of labor, unspecified trimester: Secondary | ICD-10-CM | POA: Diagnosis not present

## 2022-01-07 LAB — RH IG WORKUP (INCLUDES ABO/RH)
ABO/RH(D): O NEG
Antibody Screen: NEGATIVE
Gestational Age(Wks): 26
Unit division: 0

## 2022-01-07 LAB — GC/CHLAMYDIA PROBE AMP (~~LOC~~) NOT AT ARMC
Chlamydia: NEGATIVE
Comment: NEGATIVE
Comment: NORMAL
Neisseria Gonorrhea: NEGATIVE

## 2022-01-07 NOTE — Plan of Care (Signed)

## 2022-01-07 NOTE — Progress Notes (Signed)
Patient ID: Christine Orozco, female   DOB: [birth year], 36 y.o.   MRN: [id number] ?FACULTY PRACTICE ANTEPARTUM(COMPREHENSIVE) NOTE ? ?Christine Orozco is a 36 y.o. [id number] at [redacted]w[redacted]d by midtrimester ultrasound who is admitted for PROM.   ?Fetal presentation is breech. ?Length of Stay:  1  Days ? ?ASSESSMENT: ?Principal Problem: ?  Preterm premature rupture of membranes (PPROM) delivered, current hospitalization ? ? ?PLAN: ?PPROM ?Latency Abx ?BMZ # 2 today ?S/p NICU consult ?S/p Magnesium for CP ppx ?Delivery with worsening maternal/fetal indication. ? ?Rh negative ?S/p Rhogam ? ?Subjective: ?Feels well, bloody discharge but less so ?Patient reports the fetal movement as active. ?Patient reports uterine contraction  activity as none. ?Patient reports  vaginal bleeding as scant staining. ?Patient describes fluid per vagina as Other bloody. ? ?Vitals:  Blood pressure 122/85, pulse (!) 111, temperature 98.6 ?F (37 ?C), temperature source Oral, resp. rate 18, height 5\' 3"  (1.6 m), weight 88 kg, last menstrual period 07/03/2021, SpO2 98 %, unknown if currently breastfeeding. ?Physical Examination: ? General appearance - alert, well appearing, and in no distress ?Chest - normal effort ?Abdomen - gravid, non-tender ?Fundal Height:  size equals dates ?Extremities: extremities normal, atraumatic, no cyanosis or edema  ?Membranes:intact ? ?Fetal Monitoring:  Baseline: 120 bpm, Variability: Good {> 6 bpm), Accelerations: Non-reactive but appropriate for gestational age, and Decelerations: Absent ? ?Labs:  ?Results for orders placed or performed during the hospital encounter of 01/06/22 (from the past 24 hour(s))  ?Urinalysis, Routine w reflex microscopic Urine, Clean Catch  ? Collection Time: 01/06/22  5:27 PM  ?Result Value Ref Range  ? Color, Urine AMBER (A) YELLOW  ? APPearance CLOUDY (A) CLEAR  ? Specific Gravity, Urine 1.026 1.005 - 1.030  ? pH 6.0 5.0 - 8.0  ? Glucose, UA NEGATIVE NEGATIVE mg/dL  ? Hgb urine dipstick MODERATE  (A) NEGATIVE  ? Bilirubin Urine NEGATIVE NEGATIVE  ? Ketones, ur NEGATIVE NEGATIVE mg/dL  ? Protein, ur 100 (A) NEGATIVE mg/dL  ? Nitrite NEGATIVE NEGATIVE  ? Leukocytes,Ua MODERATE (A) NEGATIVE  ? RBC / HPF >50 (H) 0 - 5 RBC/hpf  ? WBC, UA >50 (H) 0 - 5 WBC/hpf  ? Bacteria, UA FEW (A) NONE SEEN  ? Squamous Epithelial / LPF 11-20 0 - 5  ? Mucus PRESENT   ?Rh IG workup (includes ABO/Rh)  ? Collection Time: 01/06/22  6:00 PM  ?Result Value Ref Range  ? Gestational Age(Wks) 41   ? ABO/RH(D) O NEG   ? Antibody Screen NEG   ? Unit Number 30   ? Blood Component Type RHIG   ? Unit division 00   ? Status of Unit ISSUED   ? Transfusion Status    ?  OK TO TRANSFUSE ?Performed at [facility], [address] [place], [place] ?  ?Wet prep, genital  ? Collection Time: 01/06/22  6:08 PM  ? Specimen: Vaginal/Rectal  ?Result Value Ref Range  ? Yeast Wet Prep HPF POC NONE SEEN NONE SEEN  ? Trich, Wet Prep NONE SEEN NONE SEEN  ? Clue Cells Wet Prep HPF POC NONE SEEN NONE SEEN  ? WBC, Wet Prep HPF POC >=10 (A) <10  ? Sperm NONE SEEN   ?POCT fern test  ? Collection Time: 01/06/22  6:11 PM  ?Result Value Ref Range  ? POCT Fern Test Positive = ruptured amniotic membanes   ?CBC on admission  ? Collection Time: 01/06/22  6:53 PM  ?Result Value Ref Range  ?  WBC 23.0 (H) 4.0 - 10.5 K/uL  ? RBC 3.94 3.87 - 5.11 MIL/uL  ? Hemoglobin 11.4 (L) 12.0 - 15.0 g/dL  ? HCT 34.3 (L) 36.0 - 46.0 %  ? MCV 87.1 80.0 - 100.0 fL  ? MCH 28.9 26.0 - 34.0 pg  ? MCHC 33.2 30.0 - 36.0 g/dL  ? RDW 13.3 11.5 - 15.5 %  ? Platelets 274 150 - 400 K/uL  ? nRBC 0.0 0.0 - 0.2 %  ?Type and screen [facility]  ? Collection Time: 01/06/22  6:53 PM  ?Result Value Ref Range  ? ABO/RH(D) O NEG   ? Antibody Screen NEG   ? Sample Expiration    ?  01/09/2022,2359 ?Performed at [facility], [address], [place], [place] [address] ?  ? ? ? ?Medications:  Scheduled ? [START ON 01/08/2022] amoxicillin  500 mg Oral Q8H  ?  betamethasone acetate-betamethasone sodium phosphate  12 mg Intramuscular Q24H  ? docusate sodium  100 mg Oral Daily  ? prenatal multivitamin  1 tablet Oral Q1200  ? sodium chloride flush  3 mL Intravenous Q12H  ? ?I have reviewed the patient's current medications. ? ? ?Reva Bores, MD ?01/07/2022,7:48 AM ? ?

## 2022-01-08 DIAGNOSIS — O42112 Preterm premature rupture of membranes, onset of labor more than 24 hours following rupture, second trimester: Secondary | ICD-10-CM | POA: Diagnosis not present

## 2022-01-08 DIAGNOSIS — Z3A26 26 weeks gestation of pregnancy: Secondary | ICD-10-CM

## 2022-01-08 LAB — CULTURE, BETA STREP (GROUP B ONLY): Special Requests: NORMAL

## 2022-01-08 LAB — CULTURE, OB URINE: Special Requests: NORMAL

## 2022-01-08 NOTE — Progress Notes (Signed)
Patient ID: Christine Orozco, female   DOB: 14-Sep-1986, 36 y.o.   MRN: [id number] ?FACULTY PRACTICE ANTEPARTUM(COMPREHENSIVE) NOTE ? ?Christine Orozco is a 36 y.o. [id number] at [redacted]w[redacted]d by midtrimester ultrasound who is admitted for PROM.   ?Fetal presentation is breech. ?Length of Stay:  2  Days ? ?ASSESSMENT: ?Principal Problem: ?  Preterm premature rupture of membranes (PPROM) in second trimester, antepartum ?Active Problems: ?  Rh negative status during pregnancy ?  Supervision of high-risk pregnancy ?  [redacted] weeks gestation of pregnancy ? ? ?PLAN: ?PPROM ?Continue Latency Abx ?S/p BMZ x 2 ?S/p NICU consult ?S/p Magnesium for CP ppx ?Delivery with worsening maternal/fetal indication or at  ?[redacted] weeks gestations ? ?Rh negative ?S/p Rhogam ? ? ?Continue routine antenatal care. ? ? ? ? ?Subjective: ?Feels well today. Had mild headache overnight and one elevated BP, other BPs normal. ?Patient reports the fetal movement as active. ?Patient reports uterine contraction  activity as none. ?Patient reports  vaginal bleeding as scant staining. ?Patient describes fluid per vagina as scant. ? ?Vitals:  Blood pressure (!) 121/58, pulse 89, temperature 97.8 ?F (36.6 ?C), temperature source Oral, resp. rate 18, height 5\' 3"  (1.6 m), weight 88 kg, last menstrual period 07/03/2021, SpO2 100 %, unknown if currently breastfeeding. ?Physical Examination: ? General appearance - alert, well appearing, and in no distress ?Chest - normal effort ?Abdomen - gravid, non-tender ?Fundal Height:  size equals dates ?Extremities: extremities normal, atraumatic, no cyanosis or edema  ?Membranes:intact ? ?Fetal Monitoring:  Baseline: 135 bpm, Variability: Good {> 6 bpm), Accelerations: Non-reactive but appropriate for gestational age, and Decelerations: Absent ? ?Labs:  ? ?  Latest Ref Rng & Units 01/06/2022  ?  6:53 PM 12/03/2021  ? 11:22 AM 01/11/2020  ? 10:27 PM  ?CBC  ?WBC 4.0 - 10.5 K/uL 23.0   12.4   14.4    ?Hemoglobin 12.0 - 15.0 g/dL 11.4   12.5   12.1     ?Hematocrit 36.0 - 46.0 % 34.3   37.2   36.5    ?Platelets 150 - 400 K/uL 274   259   296    ? ?[place] MFM OB DETAIL +14 WK ? ?Result Date: 12/24/2021 ?----------------------------------------------------------------------  OBSTETRICS REPORT                       (Signed Final 12/24/2021 02:24 pm) ---------------------------------------------------------------------- Patient Info  ID #:       [id number]                          D.O.B.:  May 20, 1986 (36 yrs)  Name:       Christine Orozco                 Visit Date: 12/24/2021 01:15 pm ---------------------------------------------------------------------- Performed By  Attending:        Tama High MD        Ref. Address:      [address]  [address]                                                              [place]  Performed By:     Rodrigo Ran BS      [facility]                                                              [facility]                                                              [facility]  Referred By:      [facility] ---------------------------------------------------------------------- Orders  #  Description                           Code        Ordered By  1  [place] MFM OB DETAIL +14 WK               76811.01    Emeterio Reeve ----------------------------------------------------------------------  #  Order #                     Accession #                Episode #  1  [id number]                   [id number]                 [id number] ---------------------------------------------------------------------- Indications  Late prenatal care, second trimester            O09.32  [redacted] weeks gestation of pregnancy                 Z3A.24  Antenatal screening for malformations            123456  Obesity complicating pregnancy, second          O99.212  trimester (pregravid BMI 31)  Encounter for uncertain dates  Z36.87  LR NIPS/neg [place] ---------------------------------------------------------------------- Fetal Evaluation  Num Of Fetuses:          1  Fetal Heart Rate(bpm):   143  Cardiac Activity:        Observed  Presentation:            Breech  Placenta:                Anterior  P. Cord Insertion:       Visualized  Amniotic Fluid  AFI FV:      Subjectively decreased  AFI Sum(cm)     %Tile       Largest Pocket(cm)  7.2             < 3         2.2  RUQ(cm)       RLQ(cm)       LUQ(cm)        LLQ(cm)  2.2           2             1.5            1.5 ---------------------------------------------------------------------- Biometry  BPD:      59.8  mm     G. Age:  24w 3d         41  %    CI:          70.8  %    70 - 86                                                          FL/HC:       18.5  %    18.7 - 20.9  HC:      226.5  mm     G. Age:  24w 5d         40  %    HC/AC:       1.13       1.05 - 1.21  AC:      201.3  mm     G. Age:  24w 5d         52  %    FL/BPD:      70.2  %    71 - 87  FL:         42  mm     G. Age:  23w 5d         17  %    FL/AC:       20.9  %    20 - 24  HUM:      38.2  mm     G. Age:  23w 4d         20  %  CER:      27.4  mm     G. Age:  24w 3d         69  %  LV:        3.9  mm  CM:        5.7  mm  Est. FW:     685   gm     1 lb 8 oz     36  % ---------------------------------------------------------------------- OB History  Gravidity:    5  Term:   2        Prem:   0        SAB:   2  TOP:          0       Ectopic:  0        Living: 2 ---------------------------------------------------------------------- Gestational Age  LMP:           24w 6d        Date:  07/03/21                  EDD:   04/09/22  U/S Today:     24w 3d                                        EDD:   04/12/22  Best:          24w 3d     Det. By:  U/S (12/24/21)           EDD:   04/12/22  ---------------------------------------------------------------------- Anatomy  Cranium:               Appears normal         LVOT:                   Appears normal  Cavum:                 Appears normal         Aortic Arch:            Appears normal  Ventricles:            Appears normal         Ductal Arch:            Appears normal  Choroid Plexus:        Appears normal         Diaphragm:              Appears normal  Cerebellum:            Appears normal         Stomach:                Appears normal, left                                                                        sided  Posterior Fossa:       Appears normal         Abdomen:                Appears normal  Nuchal Fold:           Not applicable ([id number]    Abdominal Wall:         Not well visualized                         wks GA)  Face:                  Appears normal         Cord Vessels:  Appears normal (3                         (orbits and profile)                           vessel cord)  Lips:                  Not well visualized    Kidneys:                Appear normal  Palate:                Not well visualized    Bladder:                Appears normal  Thoracic:              Appears normal         Spine:                  Not well visualized  Heart:                 Appears normal         Upper Extremities:      Appears normal                         (4CH, axis, and                         situs)  RVOT:                  Appears normal         Lower Extremities:      Appears normal  Other:  VC, 3VV and 3VTV visualized.  Left foot and right heel visualized.          Nasal bone and lenses visualized. ---------------------------------------------------------------------- Cervix Uterus Adnexa  Cervix  Length:           3.14  cm.  Normal appearance by transabdominal scan.  Uterus  No abnormality visualized.  Right Ovary  Not visualized.  Left Ovary  Not visualized.  Cul De Sac  No free fluid seen.  Adnexa  No abnormality visualized.  ---------------------------------------------------------------------- Impression  G5 P2. Patient is here for fetal anatomy scan.  On cell-free fetal DNA screening, the risks of fetal  aneuploidies are not increased

## 2022-01-08 NOTE — Progress Notes (Signed)
Patient called RN to room to report an increase in cramping/pressure sensation in lower pelvis.  Also reports green colored vaginal drainage and change in urine appearance. ? ?RN notes small amounts of green drainage on last few pads saved by patient.  Appearance is same as thin meconium stained fluid.  No odor noted.  Urine yellow, but very cloudy in appearance.   ? ?EFM started to assess fetal wellbeing.  Patient and RN both feel positive fetal movement. ? ?Phoned Dr. Alysia Penna with above information.  See order for catheterized urine specimen for culture. ?

## 2022-01-09 DIAGNOSIS — O42112 Preterm premature rupture of membranes, onset of labor more than 24 hours following rupture, second trimester: Secondary | ICD-10-CM | POA: Diagnosis not present

## 2022-01-09 LAB — CBC
HCT: 30.8 % — ABNORMAL LOW (ref 36.0–46.0)
Hemoglobin: 10.1 g/dL — ABNORMAL LOW (ref 12.0–15.0)
MCH: 28.9 pg (ref 26.0–34.0)
MCHC: 32.8 g/dL (ref 30.0–36.0)
MCV: 88.3 fL (ref 80.0–100.0)
Platelets: 286 10*3/uL (ref 150–400)
RBC: 3.49 MIL/uL — ABNORMAL LOW (ref 3.87–5.11)
RDW: 14 % (ref 11.5–15.5)
WBC: 24.1 10*3/uL — ABNORMAL HIGH (ref 4.0–10.5)
nRBC: 0 % (ref 0.0–0.2)

## 2022-01-09 LAB — TYPE AND SCREEN
ABO/RH(D): O NEG
Antibody Screen: POSITIVE

## 2022-01-09 MED ORDER — OXYCODONE-ACETAMINOPHEN 5-325 MG PO TABS
1.0000 | ORAL_TABLET | Freq: Once | ORAL | Status: AC
  Administered 2022-01-09: 1 via ORAL
  Filled 2022-01-09: qty 1

## 2022-01-09 NOTE — Progress Notes (Signed)
Patient ID: Christine Orozco, female   DOB: [birth year], 36 y.o.   MRN: [id number] ?ACULTY PRACTICE ANTEPARTUM COMPREHENSIVE PROGRESS NOTE ? ?Christine Orozco is a 36 y.o. [id number] at [redacted]w[redacted]d  who is admitted for PROM.   ?Fetal presentation is breech. ?Length of Stay:  3  Days ? ?Subjective: ?Pt without complaints this morning ?Patient reports good fetal movement.  She reports no uterine contractions, no bleeding and no loss of fluid per vagina. ? ?Vitals:  Blood pressure (!) 113/50, pulse 73, temperature 98.6 ?F (37 ?C), temperature source Oral, resp. rate 17, height 5\' 3"  (1.6 m), weight 88 kg, last menstrual period 07/03/2021, SpO2 97 %, unknown if currently breastfeeding. ?Physical Examination: ?Lungs clear Heart RRR ?Abd soft + BS gravid non tender ?GU deferred ?Ext non tender ? ?Fetal Monitoring:  150's, fair variability, non reactive but appropriate for gestation age ? ?Labs:  ?No results found for this or any previous visit (from the past 24 hour(s)). ? ?Imaging Studies:    ?NA  ? ?Medications:  Scheduled ? amoxicillin  500 mg Oral Q8H  ? docusate sodium  100 mg Oral Daily  ? prenatal multivitamin  1 tablet Oral Q1200  ? sodium chloride flush  3 mL Intravenous Q12H  ? ?I have reviewed the patient's current medications. ? ?ASSESSMENT: ?Patient Active Problem List  ? Diagnosis Date Noted  ? [redacted] weeks gestation of pregnancy 01/08/2022  ? Preterm premature rupture of membranes (PPROM) in second trimester, antepartum 01/06/2022  ? Supervision of high-risk pregnancy 12/03/2021  ? Rh negative status during pregnancy 06/07/2017  ? ? ?PLAN: ?Stable. S/P Mag and BMZ. Continue with latency antibiotics. No S/Sx of infection or PTL at present. Delivery at 34 weeks or for maternal/fetal indications.Marland Kitchen POC reviewed with pt.  ?Continue routine antenatal care. ? ? ?Chancy Milroy ?01/09/2022,7:54 AM ? ?  ?

## 2022-01-09 NOTE — Progress Notes (Signed)
Upon assessment, pt tearful and states that she feels unsatisfied with the care she is receiving. Pt is concerned about her vaginal discharge and states she hasn't felt right all day. Pt requesting to speak to provider. Dr. Rip Harbour notified and will assess pt at bedside.  ?

## 2022-01-10 ENCOUNTER — Encounter (HOSPITAL_COMMUNITY): Payer: Self-pay | Admitting: Family Medicine

## 2022-01-10 DIAGNOSIS — O42112 Preterm premature rupture of membranes, onset of labor more than 24 hours following rupture, second trimester: Secondary | ICD-10-CM | POA: Diagnosis not present

## 2022-01-10 DIAGNOSIS — Z3A26 26 weeks gestation of pregnancy: Secondary | ICD-10-CM | POA: Diagnosis not present

## 2022-01-10 LAB — CULTURE, OB URINE
Culture: NO GROWTH
Special Requests: NORMAL

## 2022-01-10 NOTE — Progress Notes (Signed)
Pt states lower abd tenderness, and having green, mucous-like fluid overnight. ?

## 2022-01-10 NOTE — Progress Notes (Signed)
Patient ID: Christine Orozco, female   DOB: Feb 15, 1986, 36 y.o.   MRN: [id number] ?FACULTY PRACTICE ANTEPARTUM(COMPREHENSIVE) NOTE ? ?Christine Orozco is a 36 y.o. [id number] at [redacted]w[redacted]d by midtrimester ultrasound who is admitted for PROM.   ?Fetal presentation is breech. ?Length of Stay:  4  Days ? ?ASSESSMENT: ?Principal Problem: ?  Preterm premature rupture of membranes (PPROM) in second trimester, antepartum ?Active Problems: ?  Rh negative status during pregnancy ?  Supervision of high-risk pregnancy ?  [redacted] weeks gestation of pregnancy ? ? ?PLAN: ?PPROM ?Continue Latency Abx ?S/p BMZ x 2 ?S/p NICU consult ?S/p Magnesium for CP ppx ?Delivery with worsening maternal/fetal indication or at  ?[redacted] weeks gestations ? ?Rh negative ?S/p Rhogam ? ? ?Continue routine antenatal care. ? ? ? ? ?Subjective: ?Feels well today. No bleeding, no complaints. ?Patient reports the fetal movement as active. ?Patient reports uterine contraction  activity as none. ?Patient reports  vaginal bleeding as scant staining. ?Patient describes fluid per vagina as scant. ? ?Vitals:  Blood pressure 133/85, pulse 99, temperature 98.8 ?F (37.1 ?C), temperature source Oral, resp. rate 18, height 5\' 3"  (1.6 m), weight 88 kg, last menstrual period 07/03/2021, SpO2 98 %, unknown if currently breastfeeding. ?Physical Examination: ? General appearance - alert, well appearing, and in no distress ?Chest - normal effort ?Abdomen - gravid, non-tender ?Fundal Height:  size equals dates ?Extremities: extremities normal, atraumatic, no cyanosis or edema  ?Membranes:intact ? ?Fetal Monitoring:  Baseline: 145 bpm, Variability: Good {> 6 bpm), Accelerations: Non-reactive but appropriate for gestational age, and Decelerations: Absent ? ?Labs:  ? ?  Latest Ref Rng & Units 01/09/2022  ?  6:16 PM 01/06/2022  ?  6:53 PM 12/03/2021  ? 11:22 AM  ?CBC  ?WBC 4.0 - 10.5 K/uL 24.1   23.0   12.4    ?Hemoglobin 12.0 - 15.0 g/dL 10.1   11.4   12.5    ?Hematocrit 36.0 - 46.0 % 30.8   34.3   37.2     ?Platelets 150 - 400 K/uL 286   274   259    ? ?[place] MFM OB DETAIL +14 WK ? ?Result Date: 12/24/2021 ?----------------------------------------------------------------------  OBSTETRICS REPORT                       (Signed Final 12/24/2021 02:24 pm) ---------------------------------------------------------------------- Patient Info  ID #:       [id number]                          D.O.B.:  [birth year] (36 yrs)  Name:       Christine Orozco                 Visit Date: 12/24/2021 01:15 pm ---------------------------------------------------------------------- Performed By  Attending:        Tama High MD        Ref. Address:      [address]  [place]                                                              [place]  Performed By:     Rodrigo Ran BS      [facility]                                                              [place] [facility]                                                              [facility]  Referred By:      [facility] ---------------------------------------------------------------------- Orders  #  Description                           Code        Ordered By  1  [place] MFM OB DETAIL +14 WK               76811.01    Emeterio Reeve ----------------------------------------------------------------------  #  Order #                     Accession #                Episode #  1  [id number]                   [id number]                 [id number] ---------------------------------------------------------------------- Indications  Late prenatal care, second trimester            O09.32  [redacted] weeks gestation of pregnancy                 Z3A.24  Antenatal screening for malformations           123456  Obesity complicating  pregnancy, second          O99.212  trimester (pregravid BMI 31)  Encounter for uncertain dates  Z36.87  LR NIPS/neg Horizon ---------------------------------------------------------------------- Fetal Evaluation  Num Of Fetuses:          1  Fetal Heart Rate(bpm):   143  Cardiac Activity:        Observed  Presentation:            Breech  Placenta:                Anterior  P. Cord Insertion:       Visualized  Amniotic Fluid  AFI FV:      Subjectively decreased  AFI Sum(cm)     %Tile       Largest Pocket(cm)  7.2             < 3         2.2  RUQ(cm)       RLQ(cm)       LUQ(cm)        LLQ(cm)  2.2           2             1.5            1.5 ---------------------------------------------------------------------- Biometry  BPD:      59.8  mm     G. Age:  24w 3d         41  %    CI:          70.8  %    70 - 86                                                          FL/HC:       18.5  %    18.7 - 20.9  HC:      226.5  mm     G. Age:  24w 5d         40  %    HC/AC:       1.13       1.05 - 1.21  AC:      201.3  mm     G. Age:  24w 5d         52  %    FL/BPD:      70.2  %    71 - 87  FL:         42  mm     G. Age:  23w 5d         17  %    FL/AC:       20.9  %    20 - 24  HUM:      38.2  mm     G. Age:  23w 4d         20  %  CER:      27.4  mm     G. Age:  24w 3d         68  %  LV:        3.9  mm  CM:        5.7  mm  Est. FW:     685   gm     1 lb 8 oz     36  % ---------------------------------------------------------------------- OB History  Gravidity:    5  Term:   2        Prem:   0        SAB:   2  TOP:          0       Ectopic:  0        Living: 2 ---------------------------------------------------------------------- Gestational Age  LMP:           24w 6d        Date:  07/03/21                  EDD:   04/09/22  U/S Today:     24w 3d                                        EDD:   04/12/22  Best:          24w 3d     Det. By:  U/S (12/24/21)           EDD:   04/12/22  ---------------------------------------------------------------------- Anatomy  Cranium:               Appears normal         LVOT:                   Appears normal  Cavum:                 Appears normal         Aortic Arch:            Appears normal  Ventricles:            Appears normal         Ductal Arch:            Appears normal  Choroid Plexus:        Appears normal         Diaphragm:              Appears normal  Cerebellum:            Appears normal         Stomach:                Appears normal, left                                                                        sided  Posterior Fossa:       Appears normal         Abdomen:                Appears normal  Nuchal Fold:           Not applicable ([id number]    Abdominal Wall:         Not well visualized                         wks GA)  Face:                  Appears normal         Cord Vessels:  Appears normal (3                         (orbits and profile)                           vessel cord)  Lips:                  Not well visualized    Kidneys:                Appear normal  Palate:                Not well visualized    Bladder:                Appears normal  Thoracic:              Appears normal         Spine:                  Not well visualized  Heart:                 Appears normal         Upper Extremities:      Appears normal                         (4CH, axis, and                         situs)  RVOT:                  Appears normal         Lower Extremities:      Appears normal  Other:  VC, 3VV and 3VTV visualized.  Left foot and right heel visualized.          Nasal bone and lenses visualized. ---------------------------------------------------------------------- Cervix Uterus Adnexa  Cervix  Length:           3.14  cm.  Normal appearance by transabdominal scan.  Uterus  No abnormality visualized.  Right Ovary  Not visualized.  Left Ovary  Not visualized.  Cul De Sac  No free fluid seen.  Adnexa  No abnormality visualized.  ---------------------------------------------------------------------- Impression  G5 P2. Patient is here for fetal anatomy scan.  On cell-free fetal DNA screening, the risks of fetal  aneuploidies are not increased .  Obstetrical history significant for 2 term

## 2022-01-11 LAB — RPR: RPR Ser Ql: NONREACTIVE

## 2022-01-12 DIAGNOSIS — O42112 Preterm premature rupture of membranes, onset of labor more than 24 hours following rupture, second trimester: Secondary | ICD-10-CM | POA: Diagnosis not present

## 2022-01-12 DIAGNOSIS — Z3A26 26 weeks gestation of pregnancy: Secondary | ICD-10-CM | POA: Diagnosis not present

## 2022-01-12 LAB — TYPE AND SCREEN
ABO/RH(D): O NEG
Antibody Screen: POSITIVE

## 2022-01-12 LAB — CBC
HCT: 35.4 % — ABNORMAL LOW (ref 36.0–46.0)
Hemoglobin: 12 g/dL (ref 12.0–15.0)
MCH: 29.3 pg (ref 26.0–34.0)
MCHC: 33.9 g/dL (ref 30.0–36.0)
MCV: 86.6 fL (ref 80.0–100.0)
Platelets: 300 10*3/uL (ref 150–400)
RBC: 4.09 MIL/uL (ref 3.87–5.11)
RDW: 13.4 % (ref 11.5–15.5)
WBC: 23.1 10*3/uL — ABNORMAL HIGH (ref 4.0–10.5)
nRBC: 0 % (ref 0.0–0.2)

## 2022-01-12 NOTE — Progress Notes (Signed)
Patient ID: Christine Orozco, female   DOB: 1986-05-20, 36 y.o.   MRN: [id number] ?FACULTY PRACTICE ANTEPARTUM(COMPREHENSIVE) NOTE ? ?SISSIE MARTUS is a 36 y.o. [id number] at [redacted]w[redacted]d by midtrimester ultrasound who is admitted for PROM.   ?Fetal presentation is breech. ?Length of Stay:  6  Days ? ?Subjective: ?Discharge is yellow ?Patient reports the fetal movement as active. ?Patient reports uterine contraction  activity as rare. ?Patient reports  vaginal bleeding as none. ?Patient describes fluid per vagina as Other yellow. ? ?Vitals:  Blood pressure 125/86, pulse (!) 108, temperature 98.4 ?F (36.9 ?C), temperature source Oral, resp. rate 16, height 5\' 3"  (1.6 m), weight 88 kg, last menstrual period 07/03/2021, SpO2 99 %, unknown if currently breastfeeding. ?Physical Examination: ? General appearance - alert, well appearing, and in no distress ?Heart - normal rate and regular rhythm ?Abdomen - soft, nontender, nondistended ?Fundal Height:  size equals dates ?Cervical Exam: Not evaluated.  ?Extremities: extremities normal, atraumatic, no cyanosis or edema and Homans sign is negative, no sign of DVT  ?Membranes:ruptured ? ?Fetal Monitoring:   ?Fetal Heart Rate A   ?Mode External filed at 01/12/2022 [phone]  ?Baseline Rate (A) 145 bpm filed at 01/11/2022 2220  ?Variability 6-25 BPM filed at 01/11/2022 2220  ?Accelerations 15 x 15 filed at 01/11/2022 2220  ?Decelerations None filed at 01/11/2022 2220  ?Variable decel noted  ? ?Labs:  ?No results found for this or any previous visit (from the past 24 hour(s)). ? ? ?Medications:  Scheduled ? amoxicillin  500 mg Oral Q8H  ? docusate sodium  100 mg Oral Daily  ? prenatal multivitamin  1 tablet Oral Q1200  ? sodium chloride flush  3 mL Intravenous Q12H  ? ?I have reviewed the patient's current medications. ? ?ASSESSMENT: ?Patient Active Problem List  ? Diagnosis Date Noted  ? [redacted] weeks gestation of pregnancy 01/08/2022  ? Preterm premature rupture of membranes (PPROM) in second  trimester, antepartum 01/06/2022  ? Supervision of high-risk pregnancy 12/03/2021  ? Rh negative status during pregnancy 06/07/2017  ? ? ?PLAN: ?Continue to observe for PTL and for fetal well being ? ?Emeterio Reeve ?01/12/2022,9:40 AM ? ? ? ? ? ? ? ?

## 2022-01-13 NOTE — Progress Notes (Signed)
Patient ID: Christine Orozco, female   DOB: 07/29/1986, 36 y.o.   MRN: [id number] ?Patient ID: Christine Orozco, female   DOB: [birth year], 36 y.o.   MRN: [id number] ?FACULTY PRACTICE ANTEPARTUM(COMPREHENSIVE) NOTE ?  ?Christine Orozco is a 36 y.o. [id number] at [redacted]w[redacted]d by midtrimester ultrasound who is admitted for PROM.   ?Fetal presentation is breech. ?Length of Stay:  7  Days ?  ?Subjective: ?Discharge is yellow ?Patient reports the fetal movement as active. ?Patient reports uterine contraction  activity as rare. ?Patient reports  vaginal bleeding as none. ?Patient describes fluid per vagina as Other yellow. ?  ?Blood pressure 117/69, pulse 91, temperature 97.7 ?F (36.5 ?C), temperature source Oral, resp. rate 16, height 5\' 3"  (1.6 m), weight 88 kg, last menstrual period 07/03/2021, SpO2 99 %, unknown if currently breastfeeding. ? ?Physical Examination: ? General appearance - alert, well appearing, and in no distress ?Heart - normal rate and regular rhythm ?Abdomen - soft, nontender, nondistended ?Fundal Height:  size equals dates ?Cervical Exam: Not evaluated.  ?Extremities: extremities normal, atraumatic, no cyanosis or edema and Homans sign is negative, no sign of DVT  ?Membranes:ruptured ?  ?Fetal Monitoring:   ?Fetal Heart Rate A   ?Mode External  [removed] filed at 01/12/2022 2326  ?Baseline Rate (A) 150 bpm filed at 01/12/2022 2326  ?Variability <5 BPM, 6-25 BPM  [periods of moderate] filed at 01/12/2022 2326  ?Accelerations None filed at 01/12/2022 2326  ?Decelerations Variable filed at 01/12/2022 2326  ? ? ?  ?Labs:  ?No results found for this or any previous visit (from the past 24 hour(s)). ?  ?  ?Medications:  Scheduled ? amoxicillin  500 mg Oral Q8H  ? docusate sodium  100 mg Oral Daily  ? prenatal multivitamin  1 tablet Oral Q1200  ? sodium chloride flush  3 mL Intravenous Q12H  ?  ?I have reviewed the patient's current medications. ?  ?ASSESSMENT: ?    ?Patient Active Problem List  ?  Diagnosis Date Noted  ? [redacted]  weeks gestation of pregnancy 01/08/2022  ? Preterm premature rupture of membranes (PPROM) in second trimester, antepartum 01/06/2022  ? Supervision of high-risk pregnancy 12/03/2021  ? Rh negative status during pregnancy 06/07/2017  ?  ?  ?PLAN: ?Continue to observe for PTL and for fetal well being ? ?08/07/2017, MD ?01/13/2022 ?10:18 AM ? ? ?

## 2022-01-14 ENCOUNTER — Other Ambulatory Visit: Payer: Medicaid Other

## 2022-01-14 DIAGNOSIS — Z3A26 26 weeks gestation of pregnancy: Secondary | ICD-10-CM | POA: Diagnosis not present

## 2022-01-14 DIAGNOSIS — O42112 Preterm premature rupture of membranes, onset of labor more than 24 hours following rupture, second trimester: Secondary | ICD-10-CM | POA: Diagnosis not present

## 2022-01-14 MED ORDER — POLYETHYLENE GLYCOL 3350 17 G PO PACK
17.0000 g | PACK | Freq: Every day | ORAL | Status: DC | PRN
  Administered 2022-01-14 – 2022-01-16 (×3): 17 g via ORAL
  Filled 2022-01-14 (×3): qty 1

## 2022-01-14 MED ORDER — TETANUS-DIPHTH-ACELL PERTUSSIS 5-2.5-18.5 LF-MCG/0.5 IM SUSY
0.5000 mL | PREFILLED_SYRINGE | Freq: Once | INTRAMUSCULAR | Status: AC
  Administered 2022-01-14: 0.5 mL via INTRAMUSCULAR
  Filled 2022-01-14: qty 0.5

## 2022-01-14 NOTE — Progress Notes (Signed)
Initial Nutrition Assessment ? ?DOCUMENTATION CODES:  ? ?Obesity unspecified ? ?INTERVENTION:  ?Regular Diet ?Pt may order double protein portions and snacks TID if she makes request when ordering meals  ? ?NUTRITION DIAGNOSIS:  ? ?Increased nutrient needs related to  (pregnancy and fetal growth requirements) as evidenced by  (27 weeks IUP). ? ? ?GOAL:  ? ?Patient will meet greater than or equal to 90% of their needs ? ? ?MONITOR:  ? ?Weight trends ? ?REASON FOR ASSESSMENT:  ? ?Antenatal, LOS ?  ? ?ASSESSMENT:  ? ?Now 27 3/7 weeks IUP, adm due to PROM. Usual weight 78.8 kg, BMI 31. 20 lb weight gain to date. 4/12 Hct 35.4 %, on prenatal vits. ? ? ? ?Diet Order:   ?Diet Order   ? ?       ?  Diet regular Room service appropriate? Yes; Fluid consistency: Thin  Diet effective now       ?  ? ?  ?  ? ?  ? ? ?EDUCATION NEEDS:  ? ?No education needs have been identified at this time ? ?Skin:  Skin Assessment: Reviewed RN Assessment ? ? ? ?Height:  ? ?Ht Readings from Last 1 Encounters:  ?01/06/22 5\' 3"  (1.6 m)  ? ? ?Weight:  ? ?Wt Readings from Last 1 Encounters:  ?01/06/22 88 kg  ? ? ?Ideal Body Weight:   115 lbs ? ? ?BMI:  Body mass index is 34.35 kg/m?. ? ?Estimated Nutritional Needs:  ? ?Kcal:  2400-2500 ? ?Protein:  100-110 ? ?Fluid:  2.5L ? ? ? ? ?

## 2022-01-14 NOTE — Progress Notes (Signed)
Patient ID: CIANNE USUI, female   DOB: 04-Dec-1985, 36 y.o.   MRN: [id number] ? ?FACULTY PRACTICE ANTEPARTUM(COMPREHENSIVE) NOTE ?  ?MARLETA AGOSTINO is a 36 y.o. [id number] at [redacted]w[redacted]d by midtrimester ultrasound who is admitted for PPROM.   ?Fetal presentation is breech. ?Length of Stay:  78 Days ?  ?Subjective: ?Discharge is yellow ?Patient reports the fetal movement as active. ?Patient reports uterine contraction  activity as none. ?Patient reports vaginal bleeding as none. ?Patient describes fluid per vagina as Other yellow. ?  ?Blood pressure 117/69, pulse 91, temperature 97.7 ?F (36.5 ?C), temperature source Oral, resp. rate 16, height 5\' 3"  (1.6 m), weight 88 kg, last menstrual period 07/03/2021, SpO2 99 %, unknown if currently breastfeeding. ? ?Physical Examination: ? General appearance - alert, well appearing, and in no distress ?Heart - normal rate and regular rhythm ?Abdomen - soft, nontender, nondistended ?Fundal Height:  size equals dates ?Cervical Exam: Not evaluated.  ?Extremities: extremities normal, atraumatic, no cyanosis or edema and Homans sign is negative, no sign of DVT  ?Membranes:ruptured ?  ?Fetal Monitoring:   ?FHT: 150, moderate variability, no decels, +accels 10x10 appropriate for GA ?Toco: None ? ?  ?Labs:  ?No results found for this or any previous visit (from the past 24 hour(s)). ?  ?  ?Medications:  Scheduled ? amoxicillin  500 mg Oral Q8H  ? docusate sodium  100 mg Oral Daily  ? prenatal multivitamin  1 tablet Oral Q1200  ? sodium chloride flush  3 mL Intravenous Q12H  ?  ?I have reviewed the patient's current medications. ?  ?ASSESSMENT: ?    ?Patient Active Problem List  ?  Diagnosis Date Noted  ? [redacted] weeks gestation of pregnancy 01/08/2022  ? Preterm premature rupture of membranes (PPROM) in second trimester, antepartum 01/06/2022  ? Supervision of high-risk pregnancy 12/03/2021  ? Rh negative status during pregnancy 06/07/2017  ?  ?  ?PLAN: ?Continue to observe for PTL and for fetal  well being ?CBC on 4/12 was wnl ?S/p Latency/mag/BMZ. Would be candidate for rescue BMZ.  ?Reviewed tdap - pt accepts. Will do after kids visit.  ?Rh negative - Rhogam given on 4/6 ?GBS neg ?Last growth was 3/24. Check growth on Monday. Order placed.  ? ?Radene Gunning, MD ?01/14/2022 ?7:28 AM ? ? ?

## 2022-01-15 LAB — TYPE AND SCREEN
ABO/RH(D): O NEG
Antibody Screen: POSITIVE

## 2022-01-15 LAB — CBC
HCT: 32.5 % — ABNORMAL LOW (ref 36.0–46.0)
Hemoglobin: 11 g/dL — ABNORMAL LOW (ref 12.0–15.0)
MCH: 29.3 pg (ref 26.0–34.0)
MCHC: 33.8 g/dL (ref 30.0–36.0)
MCV: 86.4 fL (ref 80.0–100.0)
Platelets: 288 10*3/uL (ref 150–400)
RBC: 3.76 MIL/uL — ABNORMAL LOW (ref 3.87–5.11)
RDW: 13.7 % (ref 11.5–15.5)
WBC: 25.6 10*3/uL — ABNORMAL HIGH (ref 4.0–10.5)
nRBC: 0 % (ref 0.0–0.2)

## 2022-01-15 MED ORDER — NIFEDIPINE 10 MG PO CAPS
30.0000 mg | ORAL_CAPSULE | Freq: Once | ORAL | Status: AC
  Administered 2022-01-15: 30 mg via ORAL
  Filled 2022-01-15: qty 3

## 2022-01-15 MED ORDER — LACTATED RINGERS IV BOLUS
500.0000 mL | Freq: Once | INTRAVENOUS | Status: AC
  Administered 2022-01-15: 500 mL via INTRAVENOUS

## 2022-01-15 MED ORDER — NIFEDIPINE 10 MG PO CAPS
10.0000 mg | ORAL_CAPSULE | Freq: Four times a day (QID) | ORAL | Status: DC | PRN
  Administered 2022-01-16: 10 mg via ORAL
  Filled 2022-01-15: qty 1

## 2022-01-15 NOTE — Progress Notes (Signed)
FACULTY PRACTICE ANTEPARTUM PROGRESS NOTE ? ?Christine Orozco is a 36 y.o. [id number] at [redacted]w[redacted]d who is admitted for PROM.  Estimated Date of Delivery: 04/12/22 ?Fetal presentation is breech. ? ?Length of Stay:  9 Days. Admitted 01/06/2022 ? ?Subjective: ?Pt seen.  She is resting quietly.  She denies abdominal tenderness, fever, chills or malodorous discharge. ?Patient reports normal fetal movement.  She denies uterine contractions, denies bleeding. She still notes leaking of fluid per vagina with a yellow tint. ? ?Vitals:  Blood pressure 125/72, pulse 92, temperature 98.6 ?F (37 ?C), temperature source Oral, resp. rate 18, height 5\' 3"  (1.6 m), weight 88 kg, last menstrual period 07/03/2021, SpO2 100 %, unknown if currently breastfeeding. ?Physical Examination: ?CONSTITUTIONAL: Well-developed, well-nourished female in no acute distress.  ?HENT:  Normocephalic, atraumatic, External right and left ear normal. Oropharynx is clear and moist ?EYES: Conjunctivae and EOM are normal.  ?NECK: Normal range of motion, supple, no masses. ?SKIN: Skin is warm and dry. No rash noted. Not diaphoretic. No erythema. No pallor. ?Fries: Alert and oriented to person, place, and time. Normal reflexes, muscle tone coordination. No cranial nerve deficit noted. ?PSYCHIATRIC: Normal mood and affect. Normal behavior. Normal judgment and thought content. ?CARDIOVASCULAR: Normal heart rate noted, regular rhythm ?RESPIRATORY: Effort and breath sounds normal, no problems with respiration noted ?MUSCULOSKELETAL: Normal range of motion. No edema and no tenderness. ?ABDOMEN: Soft, nontender, nondistended, gravid. ?CERVIX: deferred ? ?Fetal monitoring: FHR: 130 bpm, Variability: moderate, Accelerations: Present, Decelerations: Absent  ?Uterine activity: none  ? ?No results found for this or any previous visit (from the past 48 hour(s)). ? ?I have reviewed the patient's current medications. ? ?ASSESSMENT: ?Principal Problem: ?  Preterm premature rupture  of membranes (PPROM) in second trimester, antepartum ?Active Problems: ?  Rh negative status during pregnancy ?  Supervision of high-risk pregnancy ?  [redacted] weeks gestation of pregnancy ? ? ?PLAN: ?Continue inpatient observation for PTL and fetal well being ?CBC pending ?S/p BMZ and latency abx ?Growth scan on Monday ? ? ?Continue routine antenatal care. ? ? ?Lynnda Shields, MD FACOG ?Faculty Attending, [facility] ?01/15/2022 6:54 AM  ?

## 2022-01-15 NOTE — Progress Notes (Signed)
Late entry: ? ?Pt was placed on fetal monitors and noted to have irregular contractions.   ? ?O: BP 136/75 (BP Location: Left Arm)   Pulse (!) 103   Temp 98.7 ?F (37.1 ?C) (Oral)   Resp 17   Ht 5\' 3"  (1.6 m)   Wt 88 kg   LMP 07/03/2021   SpO2 100%   BMI 34.35 kg/m?  ? ?FHT: 140, moderate variability, +10x10 accels, no decels ?Toco: minimal irritability ?SSE: Speculum placed- yellow dishcarge noted, grossly ruptured, cervix appears ~ 1cm dilated. ? ?A/P: [id number] [redacted]w[redacted]d admitted for PPROM ?-FWB- reassuring for current gestational age ?-continue with current management as previously outlined ? ?Janyth Pupa, DO ?Attending [place], [facility] ?[facility] ? ? ?

## 2022-01-16 ENCOUNTER — Inpatient Hospital Stay (HOSPITAL_COMMUNITY): Payer: Medicaid Other | Admitting: Anesthesiology

## 2022-01-16 ENCOUNTER — Other Ambulatory Visit: Payer: Self-pay

## 2022-01-16 ENCOUNTER — Encounter (HOSPITAL_COMMUNITY)
Admission: AD | Disposition: A | Discharge status: Home / Self Care | Payer: Self-pay | Source: Home / Self Care | Attending: Obstetrics & Gynecology

## 2022-01-16 ENCOUNTER — Encounter (HOSPITAL_COMMUNITY): Payer: Self-pay | Admitting: Family Medicine

## 2022-01-16 DIAGNOSIS — Z3A27 27 weeks gestation of pregnancy: Secondary | ICD-10-CM

## 2022-01-16 DIAGNOSIS — O09529 Supervision of elderly multigravida, unspecified trimester: Secondary | ICD-10-CM

## 2022-01-16 DIAGNOSIS — O321XX0 Maternal care for breech presentation, not applicable or unspecified: Secondary | ICD-10-CM

## 2022-01-16 DIAGNOSIS — O42112 Preterm premature rupture of membranes, onset of labor more than 24 hours following rupture, second trimester: Secondary | ICD-10-CM

## 2022-01-16 DIAGNOSIS — O42912 Preterm premature rupture of membranes, unspecified as to length of time between rupture and onset of labor, second trimester: Secondary | ICD-10-CM

## 2022-01-16 SURGERY — Surgical Case
Anesthesia: Spinal

## 2022-01-16 MED ORDER — OXYTOCIN-SODIUM CHLORIDE 30-0.9 UT/500ML-% IV SOLN: 2.5000 [IU]/h | INTRAVENOUS | Status: AC

## 2022-01-16 MED ORDER — IBUPROFEN 600 MG PO TABS
600.0000 mg | ORAL_TABLET | Freq: Four times a day (QID) | ORAL | Status: DC
  Administered 2022-01-16 – 2022-01-18 (×7): 600 mg via ORAL
  Filled 2022-01-16 (×8): qty 1

## 2022-01-16 MED ORDER — MENTHOL 3 MG MT LOZG: 1.0000 | LOZENGE | OROMUCOSAL | Status: DC | PRN

## 2022-01-16 MED ORDER — KETOROLAC TROMETHAMINE 30 MG/ML IJ SOLN
30.0000 mg | Freq: Once | INTRAMUSCULAR | Status: AC | PRN
  Administered 2022-01-16: 30 mg via INTRAVENOUS

## 2022-01-16 MED ORDER — RHO D IMMUNE GLOBULIN 1500 UNIT/2ML IJ SOSY
300.0000 ug | PREFILLED_SYRINGE | Freq: Once | INTRAMUSCULAR | Status: AC
  Administered 2022-01-16: 300 ug via INTRAVENOUS
  Filled 2022-01-16: qty 2

## 2022-01-16 MED ORDER — ONDANSETRON HCL 4 MG/2ML IJ SOLN: 4.0000 mg | Freq: Once | INTRAMUSCULAR | Status: DC | PRN

## 2022-01-16 MED ORDER — OXYCODONE HCL 5 MG PO TABS: 5.0000 mg | ORAL_TABLET | Freq: Once | ORAL | Status: DC | PRN

## 2022-01-16 MED ORDER — SIMETHICONE 80 MG PO CHEW
80.0000 mg | CHEWABLE_TABLET | Freq: Three times a day (TID) | ORAL | Status: DC
  Administered 2022-01-16 – 2022-01-18 (×5): 80 mg via ORAL
  Filled 2022-01-16 (×6): qty 1

## 2022-01-16 MED ORDER — KETOROLAC TROMETHAMINE 30 MG/ML IJ SOLN
INTRAMUSCULAR | Status: AC
  Filled 2022-01-16: qty 1

## 2022-01-16 MED ORDER — METOCLOPRAMIDE HCL 5 MG/ML IJ SOLN
INTRAMUSCULAR | Status: AC
  Filled 2022-01-16: qty 2

## 2022-01-16 MED ORDER — PRENATAL MULTIVITAMIN CH
1.0000 | ORAL_TABLET | Freq: Every day | ORAL | Status: DC
  Administered 2022-01-17: 1 via ORAL
  Filled 2022-01-16: qty 1

## 2022-01-16 MED ORDER — OXYCODONE HCL 5 MG PO TABS
5.0000 mg | ORAL_TABLET | ORAL | Status: DC | PRN
  Administered 2022-01-17 – 2022-01-18 (×2): 5 mg via ORAL
  Filled 2022-01-16 (×2): qty 1

## 2022-01-16 MED ORDER — LACTATED RINGERS IV SOLN: INTRAVENOUS | Status: DC | PRN

## 2022-01-16 MED ORDER — DIBUCAINE (PERIANAL) 1 % EX OINT: 1.0000 "application " | TOPICAL_OINTMENT | CUTANEOUS | Status: DC | PRN

## 2022-01-16 MED ORDER — SOD CITRATE-CITRIC ACID 500-334 MG/5ML PO SOLN
30.0000 mL | ORAL | Status: AC
  Administered 2022-01-16: 30 mL via ORAL

## 2022-01-16 MED ORDER — PHENYLEPHRINE HCL-NACL 20-0.9 MG/250ML-% IV SOLN
INTRAVENOUS | Status: AC
  Filled 2022-01-16: qty 250

## 2022-01-16 MED ORDER — GABAPENTIN 100 MG PO CAPS
100.0000 mg | ORAL_CAPSULE | Freq: Two times a day (BID) | ORAL | Status: DC
  Administered 2022-01-16 – 2022-01-17 (×3): 100 mg via ORAL
  Filled 2022-01-16 (×3): qty 1

## 2022-01-16 MED ORDER — PANTOPRAZOLE SODIUM 40 MG PO TBEC
40.0000 mg | DELAYED_RELEASE_TABLET | Freq: Every day | ORAL | Status: DC
  Administered 2022-01-16 – 2022-01-17 (×2): 40 mg via ORAL
  Filled 2022-01-16 (×2): qty 1

## 2022-01-16 MED ORDER — SODIUM CHLORIDE 0.9 % IV SOLN
INTRAVENOUS | Status: AC
  Filled 2022-01-16: qty 5

## 2022-01-16 MED ORDER — COCONUT OIL OIL
1.0000 "application " | TOPICAL_OIL | Status: DC | PRN
  Administered 2022-01-17: 1 via TOPICAL

## 2022-01-16 MED ORDER — MEPERIDINE HCL 25 MG/ML IJ SOLN: 6.2500 mg | INTRAMUSCULAR | Status: DC | PRN

## 2022-01-16 MED ORDER — ONDANSETRON HCL 4 MG/2ML IJ SOLN
INTRAMUSCULAR | Status: DC | PRN
  Administered 2022-01-16: 4 mg via INTRAVENOUS

## 2022-01-16 MED ORDER — SENNOSIDES-DOCUSATE SODIUM 8.6-50 MG PO TABS: 2.0000 | ORAL_TABLET | Freq: Every evening | ORAL | Status: DC | PRN

## 2022-01-16 MED ORDER — DEXAMETHASONE SODIUM PHOSPHATE 4 MG/ML IJ SOLN
INTRAMUSCULAR | Status: DC | PRN
  Administered 2022-01-16: 8 mg via INTRAVENOUS

## 2022-01-16 MED ORDER — TERBUTALINE SULFATE 1 MG/ML IJ SOLN
0.2500 mg | Freq: Once | INTRAMUSCULAR | Status: AC
  Administered 2022-01-16: 0.25 mg via SUBCUTANEOUS
  Filled 2022-01-16: qty 1

## 2022-01-16 MED ORDER — OXYTOCIN-SODIUM CHLORIDE 30-0.9 UT/500ML-% IV SOLN
INTRAVENOUS | Status: DC | PRN
  Administered 2022-01-16: 300 mL via INTRAVENOUS

## 2022-01-16 MED ORDER — PROPOFOL 10 MG/ML IV BOLUS
INTRAVENOUS | Status: DC | PRN
  Administered 2022-01-16 (×2): 10 mg via INTRAVENOUS

## 2022-01-16 MED ORDER — PHENYLEPHRINE HCL-NACL 20-0.9 MG/250ML-% IV SOLN
INTRAVENOUS | Status: DC | PRN
  Administered 2022-01-16: 60 ug/min via INTRAVENOUS

## 2022-01-16 MED ORDER — SODIUM CHLORIDE 0.9 % IR SOLN
Status: DC | PRN
  Administered 2022-01-16: 1000 mL

## 2022-01-16 MED ORDER — STERILE WATER FOR IRRIGATION IR SOLN
Status: DC | PRN
  Administered 2022-01-16: 1000 mL

## 2022-01-16 MED ORDER — POLYETHYLENE GLYCOL 3350 17 G PO PACK
17.0000 g | PACK | Freq: Every day | ORAL | Status: DC
  Administered 2022-01-17: 17 g via ORAL
  Filled 2022-01-16: qty 1

## 2022-01-16 MED ORDER — SODIUM CHLORIDE 0.9 % IV SOLN
500.0000 mg | Freq: Once | INTRAVENOUS | Status: AC
  Administered 2022-01-16: 500 mg via INTRAVENOUS

## 2022-01-16 MED ORDER — DIPHENHYDRAMINE HCL 25 MG PO CAPS: 25.0000 mg | ORAL_CAPSULE | Freq: Four times a day (QID) | ORAL | Status: DC | PRN

## 2022-01-16 MED ORDER — PROPOFOL 10 MG/ML IV BOLUS
INTRAVENOUS | Status: AC
  Filled 2022-01-16: qty 20

## 2022-01-16 MED ORDER — LACTATED RINGERS IV BOLUS
500.0000 mL | Freq: Once | INTRAVENOUS | Status: AC
  Administered 2022-01-16: 500 mL via INTRAVENOUS

## 2022-01-16 MED ORDER — PHENYLEPHRINE HCL (PRESSORS) 10 MG/ML IV SOLN
INTRAVENOUS | Status: DC | PRN
  Administered 2022-01-16 (×2): 80 ug via INTRAVENOUS
  Administered 2022-01-16 (×2): 40 ug via INTRAVENOUS

## 2022-01-16 MED ORDER — OXYTOCIN-SODIUM CHLORIDE 30-0.9 UT/500ML-% IV SOLN
INTRAVENOUS | Status: AC
  Filled 2022-01-16: qty 500

## 2022-01-16 MED ORDER — PHENYLEPHRINE 40 MCG/ML (10ML) SYRINGE FOR IV PUSH (FOR BLOOD PRESSURE SUPPORT)
PREFILLED_SYRINGE | INTRAVENOUS | Status: AC
  Filled 2022-01-16: qty 10

## 2022-01-16 MED ORDER — HYDROMORPHONE HCL 1 MG/ML IJ SOLN: 0.2500 mg | INTRAMUSCULAR | Status: DC | PRN

## 2022-01-16 MED ORDER — OXYCODONE HCL 5 MG/5ML PO SOLN: 5.0000 mg | Freq: Once | ORAL | Status: DC | PRN

## 2022-01-16 MED ORDER — METOCLOPRAMIDE HCL 5 MG/ML IJ SOLN
INTRAMUSCULAR | Status: DC | PRN
  Administered 2022-01-16: 10 mg via INTRAVENOUS

## 2022-01-16 MED ORDER — CEFAZOLIN SODIUM-DEXTROSE 2-3 GM-%(50ML) IV SOLR
INTRAVENOUS | Status: DC | PRN
  Administered 2022-01-16: 2 g via INTRAVENOUS

## 2022-01-16 MED ORDER — FENTANYL CITRATE (PF) 100 MCG/2ML IJ SOLN
INTRAMUSCULAR | Status: AC
  Filled 2022-01-16: qty 2

## 2022-01-16 MED ORDER — SOD CITRATE-CITRIC ACID 500-334 MG/5ML PO SOLN
ORAL | Status: AC
  Filled 2022-01-16: qty 30

## 2022-01-16 MED ORDER — MORPHINE SULFATE (PF) 0.5 MG/ML IJ SOLN
INTRAMUSCULAR | Status: AC
  Filled 2022-01-16: qty 10

## 2022-01-16 MED ORDER — TETANUS-DIPHTH-ACELL PERTUSSIS 5-2.5-18.5 LF-MCG/0.5 IM SUSY: 0.5000 mL | PREFILLED_SYRINGE | Freq: Once | INTRAMUSCULAR | Status: DC

## 2022-01-16 MED ORDER — CEFAZOLIN SODIUM-DEXTROSE 2-4 GM/100ML-% IV SOLN
INTRAVENOUS | Status: AC
  Filled 2022-01-16: qty 100

## 2022-01-16 MED ORDER — WITCH HAZEL-GLYCERIN EX PADS: 1.0000 "application " | MEDICATED_PAD | CUTANEOUS | Status: DC | PRN

## 2022-01-16 MED ORDER — ENOXAPARIN SODIUM 40 MG/0.4ML IJ SOSY
40.0000 mg | PREFILLED_SYRINGE | INTRAMUSCULAR | Status: DC
  Administered 2022-01-17 – 2022-01-18 (×2): 40 mg via SUBCUTANEOUS
  Filled 2022-01-16 (×2): qty 0.4

## 2022-01-16 MED ORDER — DEXAMETHASONE SODIUM PHOSPHATE 4 MG/ML IJ SOLN
INTRAMUSCULAR | Status: AC
  Filled 2022-01-16: qty 2

## 2022-01-16 MED ORDER — ONDANSETRON HCL 4 MG/2ML IJ SOLN
INTRAMUSCULAR | Status: AC
  Filled 2022-01-16: qty 2

## 2022-01-16 MED ORDER — SCOPOLAMINE 1 MG/3DAYS TD PT72
MEDICATED_PATCH | TRANSDERMAL | Status: DC | PRN
  Administered 2022-01-16: 1 via TRANSDERMAL

## 2022-01-16 SURGICAL SUPPLY — 34 items
BENZOIN TINCTURE PRP APPL 2/3 (GAUZE/BANDAGES/DRESSINGS) ×2 IMPLANT
CANISTER SUCT 3000ML PPV (MISCELLANEOUS) ×2 IMPLANT
CHLORAPREP W/TINT 26ML (MISCELLANEOUS) ×4 IMPLANT
CLAMP UMBILICAL CORD (MISCELLANEOUS) ×2 IMPLANT
CLOSURE STERI STRIP 1/2 X4 (GAUZE/BANDAGES/DRESSINGS) ×1 IMPLANT
DRSG OPSITE POSTOP 4X10 (GAUZE/BANDAGES/DRESSINGS) ×2 IMPLANT
ELECT REM PT RETURN 9FT ADLT (ELECTROSURGICAL) ×2
ELECTRODE REM PT RTRN 9FT ADLT (ELECTROSURGICAL) ×1 IMPLANT
EXTRACTOR VACUUM KIWI (MISCELLANEOUS) ×1 IMPLANT
GLOVE BIOGEL PI IND STRL 7.0 (GLOVE) ×2 IMPLANT
GLOVE BIOGEL PI IND STRL 7.5 (GLOVE) ×1 IMPLANT
GLOVE BIOGEL PI INDICATOR 7.0 (GLOVE) ×2
GLOVE BIOGEL PI INDICATOR 7.5 (GLOVE) ×1
GLOVE SKINSENSE NS SZ7.0 (GLOVE) ×1
GLOVE SKINSENSE STRL SZ7.0 (GLOVE) ×1 IMPLANT
GOWN STRL REUS W/ TWL LRG LVL3 (GOWN DISPOSABLE) ×2 IMPLANT
GOWN STRL REUS W/ TWL XL LVL3 (GOWN DISPOSABLE) ×1 IMPLANT
GOWN STRL REUS W/TWL LRG LVL3 (GOWN DISPOSABLE) ×2
GOWN STRL REUS W/TWL XL LVL3 (GOWN DISPOSABLE) ×1
NS IRRIG 1000ML POUR BTL (IV SOLUTION) ×2 IMPLANT
PACK C SECTION WH (CUSTOM PROCEDURE TRAY) ×2 IMPLANT
PAD ABD 7.5X8 STRL (GAUZE/BANDAGES/DRESSINGS) ×2 IMPLANT
PAD OB MATERNITY 4.3X12.25 (PERSONAL CARE ITEMS) ×2 IMPLANT
PAD PREP 24X48 CUFFED NSTRL (MISCELLANEOUS) ×2 IMPLANT
STRIP CLOSURE SKIN 1/2X4 (GAUZE/BANDAGES/DRESSINGS) ×2 IMPLANT
SUT MNCRL 0 VIOLET CTX 36 (SUTURE) ×2 IMPLANT
SUT MON AB 4-0 PS1 27 (SUTURE) ×2 IMPLANT
SUT MONOCRYL 0 CTX 36 (SUTURE) ×2
SUT PLAIN 2 0 XLH (SUTURE) ×2 IMPLANT
SUT VIC AB 0 CT1 36 (SUTURE) ×4 IMPLANT
SUT VIC AB 3-0 CT1 27 (SUTURE) ×1
SUT VIC AB 3-0 CT1 TAPERPNT 27 (SUTURE) ×1 IMPLANT
TOWEL OR 17X24 6PK STRL BLUE (TOWEL DISPOSABLE) ×4 IMPLANT
WATER STERILE IRR 1000ML POUR (IV SOLUTION) ×2 IMPLANT

## 2022-01-16 NOTE — Anesthesia Postprocedure Evaluation (Signed)
Anesthesia Post Note ? ?Patient: Christine Orozco ? ?Procedure(s) Performed: CESAREAN SECTION ? ?  ? ?Patient location during evaluation: Mother Baby ?Anesthesia Type: Spinal ?Level of consciousness: oriented and awake and alert ?Pain management: pain level controlled ?Vital Signs Assessment: post-procedure vital signs reviewed and stable ?Respiratory status: spontaneous breathing and respiratory function stable ?Cardiovascular status: blood pressure returned to baseline and stable ?Postop Assessment: no headache, no backache, no apparent nausea or vomiting and able to ambulate ?Anesthetic complications: no ? ? ?No notable events documented. ? ?Last Vitals:  ?Vitals:  ? 01/16/22 1600 01/16/22 1615  ?BP: 113/70 103/77  ?Pulse: 97 99  ?Resp: 15 (!) 23  ?Temp:  36.7 ?C  ?SpO2: 100% 100%  ?  ?Last Pain:  ?Vitals:  ? 01/16/22 1615  ?TempSrc: Axillary  ?PainSc: 0-No pain  ? ?Pain Goal: Patients Stated Pain Goal: 3 (01/08/22 1010) ? ?  ?  ?  ?  ?  ?  ?Epidural/Spinal Function Cutaneous sensation: No Sensation (01/16/22 1615), Patient able to flex knees: No (01/16/22 1615), Patient able to lift hips off bed: No (01/16/22 1615), Back pain beyond tenderness at insertion site: No (01/16/22 1615), Progressively worsening motor and/or sensory loss: No (01/16/22 1615), Bowel and/or bladder incontinence post epidural: No (01/16/22 1615) ? ?Barnet [place] ? ? ? ? ?

## 2022-01-16 NOTE — Anesthesia Procedure Notes (Signed)
Spinal ? ?Patient location during procedure: OB ?Start time: 01/16/2022 2:27 PM ?End time: 01/16/2022 2:31 PM ?Reason for block: surgical anesthesia ?Staffing ?Performed: anesthesiologist  ?Anesthesiologist: Barnet Glasgow, MD ?Preanesthetic Checklist ?Completed: patient identified, IV checked, risks and benefits discussed, surgical consent, monitors and equipment checked, pre-op evaluation and timeout performed ?Spinal Block ?Patient position: sitting ?Prep: DuraPrep and site prepped and draped ?Patient monitoring: heart rate, cardiac monitor, continuous pulse ox and blood pressure ?Approach: midline ?Location: L3-4 ?Injection technique: single-shot ?Needle ?Needle type: Pencan  ?Needle gauge: 24 G ?Needle length: 10 cm ?Needle insertion depth: 6 cm ?Assessment ?Sensory level: T4 ?Events: CSF return ?Additional Notes ? 1 Attempt (s). Pt tolerated procedure well. ? ? ? ?

## 2022-01-16 NOTE — Progress Notes (Addendum)
OB Note ?Patient feeling more intense UCs and just got put back on the monitor ? ?Mom looks more uncomfortable and she's now 2-3/75/-1 and still feels breech. Given change in presentation, recommend c-section which she is amenable to. Cbc and t&s done yesterday. Pt ate regular breakfast at 0800. D/w OR and anesthesia and can proceed when OR is ready ? ?Will give a dose of terb in the interim ? ?O NEG ? ?  Latest Ref Rng & Units 01/15/2022  ?  6:30 PM 01/12/2022  ?  6:16 PM 01/09/2022  ?  6:16 PM  ?CBC  ?WBC 4.0 - 10.5 K/uL 25.6   23.1   24.1    ?Hemoglobin 12.0 - 15.0 g/dL 69.4   85.4   62.7    ?Hematocrit 36.0 - 46.0 % 32.5   35.4   30.8    ?Platelets 150 - 400 K/uL 288   300   286    ? ? ? ?Cornelia Copa MD ?Attending ?[facility]) ?01/16/2022 ?Time: 1352 ? ?

## 2022-01-16 NOTE — Progress Notes (Signed)
OB Note ?Patient feeling come contractions that are approx q36m. IVF bolus ordered. Bedside u/s done and still breech. SVE stable at 1.5/50/high, no blood on glove and mom looks comfortable. Baby is category I with ?accels and ?UCs q21m on EFM. Finish bolus and if stable then okay to come off monitor. D/w mom and c-section consent signed with patient in case labors in the future and and still breech.  ?She has received Mg on admit, latency abx and bmz on 4/6-7 ? ?Cornelia Copa MD ?Attending ?[facility]) ?01/16/2022 ?Time: 1200pm ? ?

## 2022-01-16 NOTE — Progress Notes (Signed)
FACULTY PRACTICE ANTEPARTUM(COMPREHENSIVE) NOTE ? ?Christine Orozco is a 36 y.o. [id number] with Estimated Date of Delivery: 04/12/22   By  best clinical estimate [redacted]w[redacted]d  who is admitted for PPROM.   ? ?Fetal presentation is breech. ?Length of Stay:  10  Days  Date of admission:01/06/2022 ? ?Subjective: ?Pt noted intermittent mild contractions.  Rates her pain 5/10, reports they are not very strong and seem to be "easing off." ?Patient reports the fetal movement as active. ?Patient reports  vaginal bleeding as  small spotting when up to void . ?Patient describes fluid per vagina as Other yellow-tinged discharge. ? ?Vitals:  Blood pressure 133/76, pulse 98, temperature 98.3 ?F (36.8 ?C), temperature source Oral, resp. rate 17, height 5\' 3"  (1.6 m), weight 88 kg, last menstrual period 07/03/2021, SpO2 96 %, unknown if currently breastfeeding. ?Vitals:  ? 01/15/22 1131 01/15/22 1556 01/15/22 2010 01/16/22 0531  ?BP: (!) 146/87 (!) 147/83 136/75 133/76  ?Pulse: 98 (!) 104 (!) 103 98  ?Resp: 18 18 17 17   ?Temp: 98.2 ?F (36.8 ?C) 98 ?F (36.7 ?C) 98.7 ?F (37.1 ?C) 98.3 ?F (36.8 ?C)  ?TempSrc: Oral Oral Oral Oral  ?SpO2: 100% 100% 100% 96%  ?Weight:      ?Height:      ? ?Physical Examination: ? General appearance - alert, well appearing, and in no distress ?Mental status - normal mood, behavior, speech, dress, motor activity, and thought processes ?Chest - CTAB ?Heart - normal rate and regular rhythm ?Abdomen - gravid, soft and non-tender ?GU: Deferred ?Extremities - no edema, no calf tenderness bilaterally ?Skin - warm and dry ? ?Fetal Monitoring:  Baseline: 135 bpm, Variability: moderate, Accelerations: +10x10accels, and Decelerations: Absent   reactive- appropriate for current gestational age ?Toco: q 01/18/22 ? ?Labs:  ?Results for orders placed or performed during the hospital encounter of 01/06/22 (from the past 24 hour(s))  ?CBC  ? Collection Time: 01/15/22  6:30 PM  ?Result Value Ref Range  ? WBC 25.6 (H) 4.0 - 10.5 K/uL  ? RBC  3.76 (L) 3.87 - 5.11 MIL/uL  ? Hemoglobin 11.0 (L) 12.0 - 15.0 g/dL  ? HCT 32.5 (L) 36.0 - 46.0 %  ? MCV 86.4 80.0 - 100.0 fL  ? MCH 29.3 26.0 - 34.0 pg  ? MCHC 33.8 30.0 - 36.0 g/dL  ? RDW 13.7 11.5 - 15.5 %  ? Platelets 288 150 - 400 K/uL  ? nRBC 0.0 0.0 - 0.2 %  ?Type and screen [facility]  ? Collection Time: 01/15/22  6:30 PM  ?Result Value Ref Range  ? ABO/RH(D) O NEG   ? Antibody Screen POS   ? Sample Expiration 01/18/2022,2359   ? Antibody Identification    ?  PASSIVELY ACQUIRED ANTI-D ?Performed at [facility], [address] [place], [address] [place] ?  ? ? ?I have reviewed the patient's current medications. ? ?ASSESSMENT: ?Christine Orozco [redacted]w[redacted]d Estimated Date of Delivery: 04/12/22  ?PPROM ?Breech presentation ? ?PLAN: ?1) Fetal well being- reassuring ?-continue q shift monitoring ? ?2) PPROM ?-s/p latency antibiotics ?-s/p BMZ ?-growth scan scheduled on Monday ?- intermittent contractions noted, will continue to closely monitor ? ?3)Maternal care ?-s/p RhoGAM ?-tylenol prn ? ?DISPO: continue in-house care as outlined above.  Ideally plan for pLTCS at 34wks or if clinically indicated based on maternal or fetal care. ? ?06/13/22 ?01/16/2022,7:27 AM ? ? ? ?  ?

## 2022-01-16 NOTE — Op Note (Addendum)
Operative Note  ? ?SURGERY DATE: 01/16/2022 ? ?PRE-OP DIAGNOSIS:  ?*Pregnancy at 27/5 ?*Preterm labor ?*Breech ?*PPROM on 4/6 ?*AMA ?*Rh negative ? ?POST-OP DIAGNOSIS: Same. Delivered  ? ?PROCEDURE: primary low transverse cesarean section via pfannenstiel skin incision with double layer uterine closure ? ?SURGEON: Surgeon(s) and Role: ?   * [place] Bing, MD - Primary ? ?ASSISTANT: None ? ?ANESTHESIA: spinal ? ?ESTIMATED BLOOD LOSS:  ? ?DRAINS: UOP via indwelling foley ? ?TOTAL IV FLUIDS: per anesthesia note ? ?VTE PROPHYLAXIS: SCDs to bilateral lower extremities ? ?ANTIBIOTICS: Two grams of Cefazolin were given., within 1 hour of skin incision and azithromycin 500mg  IV x 1 given thereafter ? ?SPECIMENS: placenta ? ?COMPLICATIONS: none ? ?FINDINGS: No intra-abdominal adhesions were noted. Grossly normal uterus, tubes and ovaries. Clear amniotic fluid, frank breech female infant, weight 1321gm, APGARs 6/9, intact placenta. ? ?PROCEDURE IN DETAIL: The patient was taken to the operating room where anesthesia was administered and normal fetal heart tones were confirmed. She was then prepped and draped in the normal fashion in the dorsal supine position with a leftward tilt.  After a time out was performed, a pfannensteil skin incision was made with the scalpel and carried through to the underlying layer of fascia. The fascia was then incised at the midline and this incision was extended laterally with the mayo scissors. Attention was turned to the superior aspect of the fascial incision which was grasped with the kocher clamps x 2, tented up and the rectus muscles were dissected off with the bovie. In a similar fashion the inferior aspect of the fascial incision was grasped with the kocher clamps, tented up and the rectus muscles dissected off with the mayo scissors. The rectus muscles were then separated in the midline and the peritoneum was entered bluntly. The bladder blade was inserted and the  vesicouterine peritoneum was identified, tented up and entered with the metzenbaum scissors. This incision was extended laterally and the bladder flap was created digitally. The bladder blade was reinserted. ? ?A low transverse hysterotomy was made with the scalpel until the endometrial cavity was breached and the amniotic sac ruptured, yielding clear amniotic fluid. This incision was extended bluntly and baby confirmed frank breech. She was easily delivered using the standard manuevers and finally the marceiu-smellie-veit method.The cord was clamped x 2 and cut, and the infant was handed to the awaiting pediatricians, after delayed cord clamping was done.  The placenta was then gradually expressed from the uterus and then the uterus was exteriorized and cleared of all clots and debris. The hysterotomy was repaired with a running suture of 1-0 monocryl. A second imbricating layer of 1-0 monocryl suture was then placed to achieve excellent hemostasis.  ? ?The uterus and adnexa were then returned to the abdomen, and the hysterotomy and all operative sites were reinspected and excellent hemostasis was noted after irrigation and suction of the abdomen with warm saline. ? ?The fascia was reapproximated with 0 Vicryl in a simple running fashion bilaterally. The subcutaneous layer was then reapproximated with interrupted sutures of 2-0 plain gut, and the skin was then closed with 4-0 monocryl, in a subcuticular fashion. ? ?The patient  tolerated the procedure well. Sponge, lap, needle, and instrument counts were correct x 2. The patient was transferred to the recovery room awake, alert and breathing independently in stable condition. ? ? MD ?Attending ?[facility] [employer]) ? ?

## 2022-01-16 NOTE — Anesthesia Preprocedure Evaluation (Signed)
Anesthesia Evaluation  ?Patient identified by MRN, date of birth, ID band ?Patient awake ? ? ? ?Reviewed: ?Allergy & Precautions, NPO status , Patient's Chart, lab work & pertinent test results ? ?Airway ?Mallampati: II ? ?TM Distance: >3 FB ?Neck ROM: Full ? ? ? Dental ?no notable dental hx. ?(+) Teeth Intact, Dental Advisory Given ?  ?Pulmonary ?neg pulmonary ROS, former smoker,  ?  ?Pulmonary exam normal ?breath sounds clear to auscultation ? ? ? ? ? ? Cardiovascular ?negative cardio ROS ?Normal cardiovascular exam ?Rhythm:Regular Rate:Normal ? ? ?  ?Neuro/Psych ?Anxiety   ? GI/Hepatic ?negative GI ROS, Neg liver ROS,   ?Endo/Other  ?negative endocrine ROS ? Renal/GU ?negative Renal ROS  ? ?  ?Musculoskeletal ?negative musculoskeletal ROS ?(+)  ? Abdominal ?  ?Peds ? Hematology ?Lab Results ?     Component                Value               Date                 ?     WBC                      25.6 (H)            01/15/2022           ?     HGB                      11.0 (L)            01/15/2022           ?     HCT                      32.5 (L)            01/15/2022           ?     MCV                      86.4                01/15/2022           ?     PLT                      288                 01/15/2022           ?   ?Anesthesia Other Findings ?All: Ultram, Augmentin, Sudafed ? Reproductive/Obstetrics ?(+) Pregnancy ? ?  ? ? ? ? ? ? ? ? ? ? ? ? ? ?  ?  ? ? ? ? ? ? ? ? ?Anesthesia Physical ?Anesthesia Plan ? ?ASA: 2 ? ?Anesthesia Plan: Spinal  ? ?Post-op Pain Management:   ? ?Induction:  ? ?PONV Risk Score and Plan: Treatment may vary due to age or medical condition and Ondansetron ? ?Airway Management Planned: Natural Airway and Nasal Cannula ? ?Additional Equipment:  ? ?Intra-op Plan:  ? ?Post-operative Plan:  ? ?Informed Consent: I have reviewed the patients History and Physical, chart, labs and discussed the procedure including the risks, benefits and alternatives for the  proposed anesthesia with the patient or authorized representative who has indicated his/her understanding and acceptance.  ? ? ? ?  Dental advisory given ? ?Plan Discussed with: CRNA ? ?Anesthesia Plan Comments: (27.5 Wk G5P2 w Breech for C/S)  ? ? ? ? ? ? ?Anesthesia Quick Evaluation ? ?

## 2022-01-16 NOTE — Transfer of Care (Signed)
Immediate Anesthesia Transfer of Care Note ? ?Patient: DAWNE CASALI ? ?Procedure(s) Performed: CESAREAN SECTION ? ?Patient Location: PACU ? ?Anesthesia Type:Spinal ? ?Level of Consciousness: awake, alert  and oriented ? ?Airway & Oxygen Therapy: Patient Spontanous Breathing ? ?Post-op Assessment: Report given to RN and Post -op Vital signs reviewed and stable ? ?Post vital signs: Reviewed and stable ? ?Last Vitals:  ?Vitals Value Taken Time  ?BP 127/78 01/16/22 1715  ?Temp 36.6 ?C 01/16/22 1645  ?Pulse 102 01/16/22 1721  ?Resp 18 01/16/22 1721  ?SpO2 98 % 01/16/22 1721  ?Vitals shown include unvalidated device data. ? ?Last Pain:  ?Vitals:  ? 01/16/22 1700  ?TempSrc:   ?PainSc: 0-No pain  ?   ? ?Patients Stated Pain Goal: 3 (01/08/22 1010) ? ?Complications: No notable events documented. ?

## 2022-01-17 ENCOUNTER — Encounter (HOSPITAL_COMMUNITY): Payer: Self-pay | Admitting: Obstetrics and Gynecology

## 2022-01-17 LAB — RH IG WORKUP (INCLUDES ABO/RH)
Fetal Screen: NEGATIVE
Gestational Age(Wks): 28
Unit division: 0

## 2022-01-17 LAB — CBC
HCT: 27.5 % — ABNORMAL LOW (ref 36.0–46.0)
Hemoglobin: 9.3 g/dL — ABNORMAL LOW (ref 12.0–15.0)
MCH: 29.6 pg (ref 26.0–34.0)
MCHC: 33.8 g/dL (ref 30.0–36.0)
MCV: 87.6 fL (ref 80.0–100.0)
Platelets: 268 10*3/uL (ref 150–400)
RBC: 3.14 MIL/uL — ABNORMAL LOW (ref 3.87–5.11)
RDW: 13.7 % (ref 11.5–15.5)
WBC: 25.2 10*3/uL — ABNORMAL HIGH (ref 4.0–10.5)
nRBC: 0 % (ref 0.0–0.2)

## 2022-01-17 LAB — CREATININE, SERUM
Creatinine, Ser: 0.56 mg/dL (ref 0.44–1.00)
GFR, Estimated: >60 mL/min (ref >60)

## 2022-01-17 MED ORDER — MEDROXYPROGESTERONE ACETATE 150 MG/ML IM SUSP
150.0000 mg | Freq: Once | INTRAMUSCULAR | Status: AC
  Administered 2022-01-18: 150 mg via INTRAMUSCULAR
  Filled 2022-01-17: qty 1

## 2022-01-17 MED ORDER — DIPHENHYDRAMINE HCL 50 MG/ML IJ SOLN: 12.5000 mg | INTRAMUSCULAR | Status: DC | PRN

## 2022-01-17 MED ORDER — NALOXONE HCL 4 MG/10ML IJ SOLN
1.0000 ug/kg/h | INTRAVENOUS | Status: DC | PRN
  Filled 2022-01-17: qty 5

## 2022-01-17 MED ORDER — DIPHENHYDRAMINE HCL 25 MG PO CAPS: 25.0000 mg | ORAL_CAPSULE | ORAL | Status: DC | PRN

## 2022-01-17 MED ORDER — SCOPOLAMINE 1 MG/3DAYS TD PT72: 1.0000 | MEDICATED_PATCH | Freq: Once | TRANSDERMAL | Status: DC

## 2022-01-17 MED ORDER — NALOXONE HCL 0.4 MG/ML IJ SOLN: 0.4000 mg | INTRAMUSCULAR | Status: DC | PRN

## 2022-01-17 MED ORDER — ONDANSETRON HCL 4 MG/2ML IJ SOLN: 4.0000 mg | Freq: Three times a day (TID) | INTRAMUSCULAR | Status: DC | PRN

## 2022-01-17 MED ORDER — SODIUM CHLORIDE 0.9% FLUSH: 3.0000 mL | INTRAVENOUS | Status: DC | PRN

## 2022-01-17 NOTE — Lactation Note (Signed)
This note was copied from a baby's chart. ?Lactation Consultation Note ? ?Patient Name: Christine Orozco ?Today's Date: 01/17/2022 ?Reason for consult: Initial assessment;Infant < 6lbs;NICU baby;Preterm <34wks;Other (Comment) (AMA) ?Age:35 hours ? ?Visited with Christine Orozco of 19 hours old pre-term NICU female, she's a P3 and experienced breastfeeding. [name] started pumping yesterday and already getting small droplets of colostrum, praised her for her efforts. Noticed that she's been spacing her pumping sessions, explained to her the importance of consistent pumping to protect her supply and for the onset of lactogenesis II; she voices understanding. Reviewed pumping schedule, pumping log, lactogenesis II, expectations and benefits of breastmilk for NICU babies. ? ?Maternal Data ?Has patient been taught Hand Expression?: Yes ?Does the patient have breastfeeding experience prior to this delivery?: Yes ?How long did the patient breastfeed?: 1st only for a few days and 2nd till 10-11 months ? ?Feeding ?Mother's Current Feeding Choice: Breast Milk and Donor Milk ? ?Lactation Tools Discussed/Used ?Tools: Pump;Flanges;Coconut oil ?Flange Size: 24 ?Breast pump type: Double-Electric Breast Pump ?Pump Education: Setup, frequency, and cleaning;Milk Storage ?Reason for Pumping: pre-term in NICU ?Pumping frequency: q 3-4 hours (recommended q 3 hours) ?Pumped volume:  (drops) ? ?Interventions ?Interventions: Breast feeding basics reviewed;Coconut oil;DEBP;Education;"The NICU and Your Baby" book;LC Services brochure ? ?Plan of care ?Encouraged Christine Orozco to start pumping consistently every 3 hours, at least 8 pumping sessions/24 hours ?Breast massage, hand expression and coconut oil were also encouraged prior pumping ? ?FOB present. All questions and concerns answered, family to contact [facility] services PRN. ? ?Discharge ?Pump: DEBP ?WIC Program: Yes ?WIC referral sent to [facility] ? ?Consult Status ?Consult Status: Follow-up ?Date:  01/17/22 ?Follow-up type: In-patient ? ? ?Doreather Hoxworth S Jaylanie Boschee ?01/17/2022, 10:17 AM ? ? ? ?

## 2022-01-17 NOTE — Progress Notes (Signed)
POSTPARTUM PROGRESS NOTE ? ?POD #1 ? ?Subjective: ? ?Christine Orozco is a 36 y.o. [phone] s/p primary LTCS at [redacted]w[redacted]d.  She reports she doing well. No acute events overnight. She denies any problems with ambulating, voiding or po intake. Pt is tolerating regular diet.  Denies nausea or vomiting. She has  passed flatus. Pain is well controlled.  Lochia is within normal limits. ? ?Objective: ?Blood pressure 111/67, pulse 72, temperature 98 ?F (36.7 ?C), temperature source Oral, resp. rate 19, height 5\' 3"  (1.6 m), weight 88 kg, last menstrual period 07/03/2021, SpO2 96 %, unknown if currently breastfeeding. ? ?Physical Exam:  ?General: alert, cooperative and no distress ?Chest: no respiratory distress ?Heart:regular rate, distal pulses intact ?Abdomen: soft, nontender,  ?Uterine Fundus: firm, appropriately tender ?DVT Evaluation: No calf swelling or tenderness ?Extremities: trace edema ?Skin: warm, dry; incision clean/dry/intact w/ honeycomb dressing in place ? ?Recent Labs  ?  01/15/22 ?1830 01/17/22 ?0425  ?HGB 11.0* 9.3*  ?HCT 32.5* 27.5*  ? ? ?Assessment/Plan: ?Christine Orozco is a 36 y.o. [phone] s/p primary LTCS at [redacted]w[redacted]d for preterm labor and breech presentation with extreme prematurity. ? ?POD#1 -  ?Contraception: depo shot prior to discharge followed by interval tubal ?Feeding: breast ? ?Dispo: Plan for discharge on 01/18/22 if possible. ? ? LOS: 11 days  ? ?01/20/22, Md ?Faculty Attending, [facility] ?01/17/2022, 9:09 AM  ?

## 2022-01-17 NOTE — Lactation Note (Signed)
This note was copied from a baby's chart. ?Lactation Consultation Note ? ?Patient Name: Christine Orozco ?Today's Date: 01/17/2022 ?  ?Age:35 hours ? ?Attempted to visit with mom but she wasn't in her room. OB Specialty care RN has already set up a pump, will attempt to visit later for initial assessment. ? ? ?Porfiria Heinrich S Knut Rondinelli ?01/17/2022, 9:55 AM ? ? ? ?

## 2022-01-18 LAB — SURGICAL PATHOLOGY

## 2022-01-18 MED ORDER — IBUPROFEN 600 MG PO TABS: 600.0000 mg | ORAL_TABLET | Freq: Four times a day (QID) | ORAL | 1 refills | Status: DC

## 2022-01-18 MED ORDER — OXYCODONE HCL 5 MG PO TABS: 5.0000 mg | ORAL_TABLET | ORAL | 0 refills | Status: DC | PRN

## 2022-01-18 NOTE — Lactation Note (Signed)
This note was copied from a baby's chart. ?Lactation Consultation Note ? ?Patient Name: Christine Orozco ?Today's Date: 01/18/2022 ?Reason for consult: Follow-up assessment;NICU baby;Preterm <34wks;Infant < 6lbs;Other (Comment);Maternal discharge (AMA) ?Age:35 hours ? ?Visited with mom of 72 hours old pre-term NICU female, she's a P3 and getting discharged today. Reviewed discharge education, pumping plan and expectations. Ms. Colvin voiced that the [facility] office has already contacted her to schedule an appt to P/U her pump but she just needs to call them back. She has a hand pump at home as a back up and in the last case scenario if she can't get the [facility] pump today, her friend will be bringing her a DEBP tonight. ? ?Maternal Data ? Maternal supply is WNL; she reports her milk is slowly started coming in. ? ?Feeding ?Mother's Current Feeding Choice: Breast Milk and Donor Milk ? ?Lactation Tools Discussed/Used ?Tools: Pump;Flanges;Coconut oil ?Flange Size: 24 ?Breast pump type: Double-Electric Breast Pump ?Pump Education: Setup, frequency, and cleaning;Milk Storage ?Reason for Pumping: pre-term in NICU ?Pumping frequency: 6 times/24 hours ?Pumped volume: 5 mL ? ?Interventions ?Interventions: Breast feeding basics reviewed;Coconut oil;DEBP;Hand pump;Education ? ?Plan of care ?Encouraged mom to start pumping consistently every 3 hours, at least 8 pumping sessions/24 hours ?Breast massage, hand expression and coconut oil were also encouraged prior pumping ?Verify receipt of WIC pump or friend's DEBP  ?  ?No other support person at this time. All questions and concerns answered, family to contact [facility] services PRN. ? ?Discharge ?Discharge Education: Engorgement and breast care ?Pump: DEBP;Manual (waiting for WIC pump) ? ?Consult Status ?Consult Status: Follow-up ?Date: 01/18/22 ?Follow-up type: In-patient ? ? ?Kameela Leipold S Cadel Stairs ?01/18/2022, 10:30 AM ? ? ? ?

## 2022-01-18 NOTE — Discharge Summary (Signed)
? ?  Postpartum Discharge Summary ? ?Date of Service updated 01/18/22 ? ?   ?Patient Name: Christine Orozco ?DOB: [birth year] ?MRN: [id number] ? ?Date of admission: 01/06/2022 ?Delivery date:01/16/2022  ?Delivering provider: Aletha Halim  ?Date of discharge: 01/18/2022 ? ?Admitting diagnosis: Preterm premature rupture of membranes (PPROM) delivered, current hospitalization [O42.919] ?Intrauterine pregnancy: [redacted]w[redacted]d    ?Secondary diagnosis:  Principal Problem: ?  Preterm premature rupture of membranes (PPROM) in second trimester, antepartum ?Active Problems: ?  Rh negative status during pregnancy ?  Supervision of high-risk pregnancy ?  [redacted] weeks gestation of pregnancy ?  AMA (advanced maternal age) multigravida 328+? ?Additional problems: NA    ?Discharge diagnosis: Preterm Pregnancy Delivered                                              ?Post partum procedures: NA ?Augmentation: N/A ?Complications: RLKG>40hours ? ?Hospital course: [facility] was admitted with PROM. She received IV antibiotics and was then transitioned oral for a total of 7 days. Received BMZ for FLM. She was followed in house afterwards until she went into labor. She had a LTCS  on 01/16/22 by Dr C. Pickens. See OP note for additional information. Her post op course was unremarkable. She progressed to ambulating, voiding, tolerating diet and good oral pain control. On POD # 2, pt was deemed amendable for discharge home. Discharge medications, wound care and follow up were reviewed. Pt verbalized understanding.  ? ?Magnesium Sulfate received: Yes: Neuroprotection ?BMZ received: Yes ?Rhophylac:Yes ?MMR:N/A ?T-DaP:Given prenatally ?Flu: Yes ?Transfusion:No ? ?Physical exam  ?Vitals:  ? 01/17/22 1657 01/17/22 1928 01/17/22 2346 01/18/22 0718  ?BP: 135/74 (!) 128/57 129/75 134/80  ?Pulse: 98 86 89 88  ?Resp: 16 16 16 16   ?Temp: 98.5 ?F (36.9 ?C) 98 ?F (36.7 ?C) 97.8 ?F (36.6 ?C) 98.5 ?F (36.9 ?C)  ?TempSrc: Oral Oral Oral Oral  ?SpO2: 99% 100% 97% 100%  ?Weight:       ?Height:      ? ?General: alert ?Lochia: appropriate ?Uterine Fundus: firm ?Incision: Healing well with no significant drainage ?DVT Evaluation: No evidence of DVT seen on physical exam. ?Labs: ?Lab Results  ?Component Value Date  ? WBC 25.2 (H) 01/17/2022  ? HGB 9.3 (L) 01/17/2022  ? HCT 27.5 (L) 01/17/2022  ? MCV 87.6 01/17/2022  ? PLT 268 01/17/2022  ? ? ?  Latest Ref Rng & Units 01/17/2022  ?  4:25 AM  ?CMP  ?Creatinine 0.44 - 1.00 mg/dL 0.56    ? ?Edinburgh Score: ? ?  01/16/2022  ? 10:27 PM  ?Edinburgh Postnatal Depression Scale Screening Tool  ?I have been able to laugh and see the funny side of things. 0  ?I have looked forward with enjoyment to things. 0  ?I have blamed myself unnecessarily when things went wrong. 1  ?I have been anxious or worried for no good reason. 2  ?I have felt scared or panicky for no good reason. 0  ?Things have been getting on top of me. 1  ?I have been so unhappy that I have had difficulty sleeping. 0  ?I have felt sad or miserable. 0  ?I have been so unhappy that I have been crying. 0  ?The thought of harming myself has occurred to me. 0  ?Edinburgh Postnatal Depression Scale Total 4  ? ? ? ? ?After  visit meds:  ?Allergies as of 01/18/2022   ? ?   Reactions  ? Beef-derived Products Nausea And Vomiting, Other (See Comments)  ? Actively vomits. Alpha-gal allergy.    ? Ultram [tramadol] Other (See Comments)  ? Hallucinate and heart stop  ? Augmentin [amoxicillin-pot Clavulanate] Other (See Comments)  ? Childhood, pt states she can take penicillin ?Has patient had a PCN reaction causing immediate rash, facial/tongue/throat swelling, SOB or lightheadedness with hypotension: No ?Has patient had a PCN reaction causing severe rash involving mucus membranes or skin necrosis: No ?Has patient had a PCN reaction that required hospitalization No ?Has patient had a PCN reaction occurring within the last 10 years: No ?If all of the above answers are "NO", then may proceed with Cephalosporin  use.  ? Sudafed [pseudoephedrine] Palpitations, Other (See Comments)  ? hyperactivity   ? ?  ? ?  ?Medication List  ?  ? ?STOP taking these medications   ? ?Blood Pressure Kit Devi ?  ?esomeprazole 40 MG capsule ?Commonly known as: [place] ?  ?omeprazole 20 MG capsule ?Commonly known as: PRILOSEC ?  ? ?  ? ?TAKE these medications   ? ?ibuprofen 600 MG tablet ?Commonly known as: ADVIL ?Take 1 tablet (600 mg total) by mouth every 6 (six) hours. ?  ?oxyCODONE 5 MG immediate release tablet ?Commonly known as: Oxy IR/ROXICODONE ?Take 1-2 tablets (5-10 mg total) by mouth every 4 (four) hours as needed for moderate pain. ?  ?PrePLUS 27-1 MG Tabs ?Take 1 tablet by mouth daily. ?  ? ?  ? ? ? ?Discharge home in stable condition ?Infant Feeding: Breast ?Infant Disposition:NICU ?Discharge instruction: per After Visit Summary and Postpartum booklet. ?Activity: Advance as tolerated. Pelvic rest for 6 weeks.  ?Diet: routine diet ?Anticipated Birth Control: Plans Interval BTL ?Postpartum Appointment: 4 weeks ?Additional Postpartum F/U: Incision check 1 week ?Future Appointments:No future appointments. ?Follow up Visit: ? Follow-up Information   ? ? [facility]. Schedule an appointment as soon as possible for a visit in 1 week(s).   ?Why: 1 week for incision check ?4 weeks for PP visit ?Contact information: ?[address] ?[place] [address] ?[phone] ? ?  ?  ? ?  ?  ? ?  ? ? ? ?  ? ?01/18/2022 ?Chancy Milroy, MD ? ? ?

## 2022-01-21 ENCOUNTER — Encounter: Payer: Medicaid Other | Admitting: Obstetrics & Gynecology

## 2022-01-21 ENCOUNTER — Ambulatory Visit: Payer: Self-pay

## 2022-01-21 NOTE — Lactation Note (Deleted)
This note was copied from a baby's chart. ? ?NICU Lactation Consultation Note ? ?Patient Name: Christine Orozco ?Today's Date: 01/21/2022 ?Age:35 days ? ? ?Subjective ?Reason for consult: Follow-up assessment ?Mother and infant are practicing bf'ing. Mother is pumping frequently. She denies breast overfullness or pain.  ? ?Mother endorses receipt of [facility] loaner pump.  ? ?Mother requests latch assistance Saturday at 1100 feeding. ? ?Objective ?Infant data: ?Mother's Current Feeding Choice: Breast Milk ?  ?Maternal data: ?[id number]  ?C-Section, Low Transverse ?Pumping frequency: 6xday ?Pumped volume: 180 mL ? ?WIC Program: Yes ?WIC Referral Sent?: Yes ?Pump: WIC Loaner, DEBP ? ?Assessment ?Maternal: ?Milk volume: Normal ? ? ?Intervention/Plan ?Interventions: Education; Infant Driven Feeding Algorithm education ? ?Plan: ?Consult Status: Follow-up ? ?NICU Follow-up type: Assist with IDF-2 (Mother does not need to pre-pump before breastfeeding) ? ?LC to f/u 4/22 @ 1100. ?Mother to continue pumping poc.  ? ?Christine Orozco ?01/21/2022, 3:44 PM ?

## 2022-01-23 ENCOUNTER — Ambulatory Visit: Payer: Self-pay

## 2022-01-23 NOTE — Lactation Note (Signed)
This note was copied from a baby's chart. ?Telephone call ? ?Patient Name: Girl Gisella Alwine ?Today's Date: 01/23/2022 ?Reason for consult: Follow-up assessment;NICU baby;Preterm <34wks;Other (Comment) (AMA. Telephone call) ?Age:35 days ? ?LC in the room to visit with mom but she hasn't come to the hospital yet; spoke with Ms. Lemke over the phone and she reported that pumping is going well and that she continues pumping consistently although sometimes she'd sleep through her alarm clock. Advised to pump for longer (or power pumping) whenever she skips a pumping session at night. She picked up her pump from the [facility] office after discharge and she's been using it since then with no problems. Asked Ms. Jemmott to call for NICU lactation the next time she comes to the hospital for an in-person F/U visit. All questions and concerns answered, continue current plan of care. ? ?Maternal Data ? Maternal supply is WNL ? ?Feeding ?Mother's Current Feeding Choice: Breast Milk ? ?Lactation Tools Discussed/Used ?Tools: Pump;Flanges ?Flange Size: 24 ?Breast pump type: Double-Electric Breast Pump ?Pump Education: Setup, frequency, and cleaning;Milk Storage ?Reason for Pumping: pre-term in NICU ?Pumping frequency: 6-7 times/24 hours ?Pumped volume: 60 mL (60-120 ml) ? ?Interventions ?Interventions: Education ? ?Discharge ?Pump: DEBP;Personal;Manual (WIC pump) ? ?Consult Status ?Consult Status: Follow-up ?Date: 01/23/22 ?Follow-up type: In-patient ? ? ?Devery Odwyer S Festus Pursel ?01/23/2022, 12:36 PM ? ? ? ?

## 2022-01-23 NOTE — Lactation Note (Signed)
This note was copied from a baby's chart. ?Lactation Consultation Note ? ?Patient Name: Christine Orozco ?Today's Date: 01/23/2022 ?Reason for consult: Follow-up assessment;NICU baby;Preterm <34wks;Other (Comment);Infant < 6lbs (AMA) ?Age:35 days ? ?Christine Orozco came to the hospital to visit baby and brought some breastmilk. She requested extra bottles and labels, they're provided, did 2 step verification for labels with NICU RN Tiffany. Christine Orozco voiced that she's been getting way more milk whenever she comes to the hospital and visit baby. Noticed that she didn't have her pump parts in baby's room; she said that she had them at home to use it with her WIC pump. Set up a DEBP kit in baby's room for Christine Orozco to pump any time she comes to visit baby; she was very Patent attorney.  ? ?Maternal Data ? Maternal supply is WNL ? ?Feeding ?Mother's Current Feeding Choice: Breast Milk ? ?Lactation Tools Discussed/Used ?Tools: Pump;Flanges ?Flange Size: 24 ?Breast pump type: Double-Electric Breast Pump ?Pump Education: Setup, frequency, and cleaning;Milk Storage ?Reason for Pumping: pre-term in NICU ?Pumping frequency: 6-7 times/24 hours ?Pumped volume: 60 mL (60-120 ml) ? ?Interventions ?Interventions: Breast feeding basics reviewed;DEBP;Education ? ?Plan of care ?Encouraged mom to continue pumping consistently every 3 hours, at least 8 pumping sessions/24 hours ?STS care was also encouraged ?  ?No other support person at this time. All questions and concerns answered, mom to contact [facility] services PRN. ? ?Discharge ?Pump: DEBP;Personal;Manual (WIC pump) ? ?Consult Status ?Consult Status: Follow-up ?Date: 01/23/22 ?Follow-up type: In-patient ? ? ?Zoraya Fiorenza S Kort Stettler ?01/23/2022, 5:36 PM ? ? ? ?

## 2022-01-25 ENCOUNTER — Telehealth (HOSPITAL_COMMUNITY): Payer: Self-pay

## 2022-01-25 NOTE — Telephone Encounter (Signed)
"  I'm hanging in there. When can I take this honeycomb dressing off?" RN told patient that it is recommended to take the honeycomb dressing off day 5 after surgery. RN reviewed incision care with patient. RN also reviewed signs of infection to report to her doctor. RN encouraged patient to go to her incision check appointment with her doctor. Patient has no other questions or concerns about her healing.  ? ?EPDS score is 2. ? Heron Nay ?01/25/2022,1606 ?

## 2022-01-26 ENCOUNTER — Ambulatory Visit: Payer: Medicaid Other | Admitting: Obstetrics and Gynecology

## 2022-01-28 ENCOUNTER — Ambulatory Visit: Payer: Medicaid Other | Admitting: Obstetrics and Gynecology

## 2022-01-28 ENCOUNTER — Ambulatory Visit: Payer: Medicaid Other

## 2022-02-02 ENCOUNTER — Ambulatory Visit: Payer: Self-pay

## 2022-02-02 NOTE — Lactation Note (Signed)
This note was copied from a baby's chart. ? ?NICU Lactation Consultation Note ? ?Patient Name: Christine Orozco ?Today's Date: 02/02/2022 ?Age:35 wk.o. ? ?Telephone call ? ?Subjective ?Reason for consult: Follow-up assessment; NICU baby; Other (Comment); Preterm <34wks; Infant < 6lbs (Telephone call) ? ?LC in the room to visit with mom, NICU RN Margreta Journey reported that mom is sick and she's not coming to the hospital out of caution, asked LC to contact mom over the phone. Spoke to Christine Orozco over the phone and she reports that pumping is going well, her supply has increased and that she's looking into purchasing a wearable pump since "it's a hassle" to stay connected to an outlet with small children at home. Reviewed options for wearable pumps now that her supply is fully established. ? ?Objective ?Infant data: ?Mother's Current Feeding Choice: Breast Milk ? ?Maternal data: ?[id number]  ?C-Section, Low Transverse ?Pumping frequency: 7 times/24 hours ?Pumped volume: 120 mL (120-240 ml) ?Flange Size: 24 ?[place] Program: Yes ?[place] Referral Sent?: Yes ?Pump: DEBP, Manual, Personal (WIC pump) ? ?Assessment ?Maternal: ?Milk volume: Normal ? ?Intervention/Plan ?Interventions: Education ?Tools: Pump; Flanges; Coconut oil ?Pump Education: Setup, frequency, and cleaning; Milk Storage ? ?Plan of care: ?Encouraged mom to continue pumping consistently every 3 hours, at least 7-8 pumping sessions/24 hours ?She'll practice "hands on pumping" and do breast massage during/after pumping to avoid the formation of plugged ducts; she was concerned about getting mastitis again (she had it with her last baby) ?She'll power pump the next morning if she accidentally sleeps through the night ?  ?All questions and concerns answered, mom to contact [facility] services PRN. ? ?Consult Status: Follow-up ?NICU Follow-up type: Weekly NICU follow up ? ? ?Christine Orozco S Christine Orozco ?02/02/2022, 1:58 PM ?

## 2022-02-15 ENCOUNTER — Ambulatory Visit: Payer: Medicaid Other | Admitting: Obstetrics & Gynecology

## 2022-02-20 ENCOUNTER — Ambulatory Visit: Payer: Self-pay

## 2022-02-20 NOTE — Lactation Note (Signed)
This note was copied from a baby's chart. Telephone call  Patient Name: Christine Orozco [id number] Date: 02/20/2022 Reason for consult: Follow-up assessment;NICU baby;Other (Comment);Preterm <34wks;Infant < 6lbs (Telephone call) Age:35 wk.o.  LC in the room to visit with mom but she has already left; spoke to Christine Orozco over the phone and she endorses pumping is going well and that she's doing it every 2 hours so she can get a 6 hours stretch at night. She continues having a strong supply but due to the logistics (bringing breastmilk to the hospital is challenging with a 4 y.o @ home) baby gets some donor milk feedings whenever we ran out of breastmilk. Christine Orozco also voiced she'll be getting a DEBP from her insurance in the next week or two. Asked her to call for NICU lactation for an in-person F/U the next time she comes to the hospital. Continue current plan of care, mom to contact [facility] services PRN in the meantime.  Feeding Mother's Current Feeding Choice: Breast Milk and Donor Milk  Lactation Tools Discussed/Used Tools: Pump;Flanges Flange Size: 24 Breast pump type: Double-Electric Breast Pump;Manual Pump Education: Setup, frequency, and cleaning;Milk Storage Reason for Pumping: pre-term in NICU Pumping frequency: 8-9 times/24 hours Pumped volume: 120 mL (up to 150 ml)  Interventions Interventions: Education  Discharge Pump: DEBP;Manual;Personal (WIC pump)  Consult Status Consult Status: Follow-up Date: 02/20/22 Follow-up type: In-patient   Christine Orozco 02/20/2022, 6:24 PM

## 2022-03-04 ENCOUNTER — Encounter: Payer: Self-pay | Admitting: Obstetrics and Gynecology

## 2022-03-04 ENCOUNTER — Ambulatory Visit (INDEPENDENT_AMBULATORY_CARE_PROVIDER_SITE_OTHER): Payer: Medicaid Other | Admitting: Obstetrics and Gynecology

## 2022-03-04 DIAGNOSIS — Z98891 History of uterine scar from previous surgery: Secondary | ICD-10-CM

## 2022-03-04 DIAGNOSIS — Z3009 Encounter for other general counseling and advice on contraception: Secondary | ICD-10-CM

## 2022-03-04 NOTE — Progress Notes (Signed)
    Post Partum Visit Note  Christine Orozco is a 36 y.o. [phone] female who presents for a postpartum visit. She is 6 week postpartum following a primary cesarean section.  I have fully reviewed the prenatal and intrapartum course. The delivery was at 27 gestational weeks.  Anesthesia: spinal. Postpartum course has been unremarkable. Baby is doing well. Baby is feeding by  feeding tube NICU . Bleeding staining only. Bowel function is normal. Bladder function is normal. Patient is not sexually active. Contraception method is Depo-Provera injections. Postpartum depression screening: negative.   The pregnancy intention screening data noted above was reviewed. Potential methods of contraception were discussed. The patient elected to proceed with No data recorded.    Health Maintenance Due  Topic Date Due   COVID-19 Vaccine (1) Never done    The following portions of the patient's history were reviewed and updated as appropriate: allergies, current medications, past family history, past medical history, past social history, past surgical history, and problem list.  Review of Systems Pertinent items noted in HPI and remainder of comprehensive ROS otherwise negative.  Objective:  LMP 07/03/2021    General:  alert   Breasts:  milk duct in left breast  expressed  Lungs: clear to auscultation bilaterally  Heart:  regular rate and rhythm, S1, S2 normal, no murmur, click, rub or gallop  Abdomen: soft, non-tender; bowel sounds normal; no masses,  no organomegaly   Wound well approximated incision  GU exam:  not indicated       Assessment:    There are no diagnoses linked to this encounter.  NL postpartum exam.   Plan:   Essential components of care per ACOG recommendations:  1.  Mood and well being: Patient with negative depression screening today. Reviewed local resources for support.  - Patient tobacco use? No.   - hx of drug use? No.    2. Infant care and feeding:  -Patient  currently breastmilk feeding? Yes. Discussed returning to work and pumping. Reviewed importance of draining breast regularly to support lactation.  -Social determinants of health (SDOH) reviewed in EPIC.   3. Sexuality, contraception and birth spacing - Patient does not know want a pregnancy in the next year.  Desired family size is is completed  - Reviewed reproductive life planning. Reviewed contraceptive methods based on pt preferences and effectiveness.  Patient desired Hormonal Injection given in hospital. Will continue for one more injection and then plans BTL BTL papers signed today   4. Sleep and fatigue -Encouraged family/partner/community support of 4 hrs of uninterrupted sleep to help with mood and fatigue  5. Physical Recovery  - Discussed patients delivery and complications. She describes her labor as good. - Patient had a C-section, no problems at delivery. Perineal healing reviewed. Patient expressed understanding - Patient has urinary incontinence? No. - Patient is safe to resume physical and sexual activity  6.  Health Maintenance - HM due items addressed Yes - Last pap smear  Diagnosis  Date Value Ref Range Status  12/03/2021   Final   - Negative for intraepithelial lesion or malignancy (NILM)   Pap smear not done at today's visit.  -Breast Cancer screening indicated? No.   7. Chronic Disease/Pregnancy Condition follow up: None  - PCP follow up  Nettie Elm. MD Center for [employer], [facility]

## 2022-03-04 NOTE — Patient Instructions (Signed)

## 2022-03-06 ENCOUNTER — Ambulatory Visit: Payer: Self-pay

## 2022-03-06 NOTE — Lactation Note (Signed)
This note was copied from a baby's chart. Telephone call  Patient Name: Christine Orozco [id number] Date: 03/06/2022 Reason for consult: Follow-up assessment;NICU baby;Other (Comment);Infant < 6lbs;Late-preterm 34-36.6wks (Telephone call) Age:35 wk.o.  LC in the room to visit with mom, but she didn't come to visit today. NICU RN Misty Stanley has mom on the phone and LC joined the call; the milk bank has reported that they run out of breastmilk and wanted to check with [name] if baby can start formula. [name] reported that her supply has greatly decreased since she started taking allergy pills (Rx by MD); she also voiced that her breast started to hurt and got really tender, full and hard, S/S are consistent with engorgement. Mom's Dr. advised her to do hot showers and cabbage leaves, reviewed actual treatment for engorgement and advised her to d/c what she's been doing and ice her breast for 20 minutes before each pumping session instead. She'll also check with her Dr. if they can change her allergy med for another one that doesn't affect her supply. [name] said she won't be able to come in this weekend (her babysitter is out of town) and she's OK with supplementing with formula in the meantime. All questions and concerns answered, mom to contact LC services PRN.  Maternal Data  Maternal supply has decreased and is now [employer] Mother's Current Feeding Choice: Breast Milk  Lactation Tools Discussed/Used Tools: Pump;Flanges Flange Size: 24 Breast pump type: Double-Electric Breast Pump Pump Education: Setup, frequency, and cleaning;Milk Storage Reason for Pumping: LPI in NICU Pumping frequency: 6-8 times/24 hours Pumped volume: 45 mL  Interventions Interventions: Education  Discharge Discharge Education: Engorgement and breast care Pump: DEBP;Manual;Personal (WIC pump)  Consult Status Consult Status: NICU follow-up Date: 03/06/22 Follow-up type: In-patient   Claire Dolores Venetia Constable 03/06/2022, 12:10 PM

## 2022-03-29 ENCOUNTER — Ambulatory Visit: Payer: Self-pay

## 2022-03-29 NOTE — Lactation Note (Signed)
This note was copied from a baby's chart. Telephone call  Patient Name: Christine Orozco [id number] Date: 03/29/2022 Reason for consult: Follow-up assessment;NICU baby;Early term 37-38.6wks;Other (Comment) (Telephone call) Age:34 m.o.  This LC attempted to visit with mom multiple times this week but she didn't come to the hospital during day shift. Spoke with [name] over the phone and she reported she's no longer pumping, she stopped in early June (last breastmilk feeding for baby was on 03/06/22). [name] also voiced that she had mastitis afterwards but that she received medical care and antibiotic treatment. She reported that as today her breast are no longer hurting and denies any S/S of engorgement, mastitis or pain/discomfort. LC services are completed at this point.  Feeding Mother's Current Feeding Choice: Formula  Consult Status Consult Status: Complete Date: 03/29/22 Follow-up type: Call as needed   Keghan Mcfarren Venetia Constable 03/29/2022, 4:50 PM

## 2022-04-15 ENCOUNTER — Ambulatory Visit (INDEPENDENT_AMBULATORY_CARE_PROVIDER_SITE_OTHER): Payer: Medicaid Other | Admitting: General Practice

## 2022-04-15 DIAGNOSIS — Z3042 Encounter for surveillance of injectable contraceptive: Secondary | ICD-10-CM

## 2022-04-15 MED ORDER — MEDROXYPROGESTERONE ACETATE 150 MG/ML IM SUSP
150.0000 mg | Freq: Once | INTRAMUSCULAR | Status: AC
  Administered 2022-04-15: 150 mg via INTRAMUSCULAR

## 2022-04-15 NOTE — Progress Notes (Signed)
SUBJECTIVE: Christine Orozco is a 36 y.o. female who presents for Depo injection.  OBJECTIVE: Appears well, in no apparent distress.  Vital signs are normal.   ASSESSMENT: Need for BC. Last PAP 12/03/21.   PLAN: Depo given in right deltoid. Tolerated well. Next Depo due 9/23-10/13.

## 2022-04-16 ENCOUNTER — Inpatient Hospital Stay (HOSPITAL_COMMUNITY): Admit: 2022-04-16 | Payer: Self-pay

## 2022-05-19 ENCOUNTER — Ambulatory Visit: Payer: Medicaid Other | Admitting: Obstetrics and Gynecology

## 2022-06-15 ENCOUNTER — Telehealth: Payer: Medicaid Other | Admitting: Physician Assistant

## 2022-06-15 DIAGNOSIS — S025XXA Fracture of tooth (traumatic), initial encounter for closed fracture: Secondary | ICD-10-CM

## 2022-06-15 DIAGNOSIS — K047 Periapical abscess without sinus: Secondary | ICD-10-CM

## 2022-06-16 NOTE — Progress Notes (Signed)
Because of severity of pain and noted inability to fully open mouth/jaw, I feel your condition warrants further evaluation and I recommend that you be seen in a face to face visit.   NOTE: There will be NO CHARGE for this eVisit   If you are having a true medical emergency please call 911.      For an urgent face to face visit, [place] has seven urgent care centers for your convenience:     [facility] Center at [facility] Directions [phone] [address] [place], [place] [address]    [facility] Health Urgent Care Center [facility]) Get Driving Directions [phone] [address] [place], [place] [address]  [facility] Health Urgent Care Center [facility]) Get Driving Directions [phone] [address] [place],  [place]  [address]  [facility] Health Urgent Care Center [facility] - at [employer] Directions  [phone] [address] [place],  [place] [address]   [facility] Health Urgent Care at [facility] Get Driving Directions [phone] [address], [address] [place], [place] [address]   [facility] Health Urgent Care at [facility] Get Driving Directions  [phone] [address] [place], [place] [address]   [facility] Health Urgent Care at [facility] Directions [phone] [address], [address] [place], [place] [address]  Your MyChart E-visit questionnaire answers were reviewed by a board certified advanced clinical practitioner to complete your personal care plan based on your specific symptoms.  Thank you for using e-Visits.

## 2022-07-01 ENCOUNTER — Ambulatory Visit: Payer: Medicaid Other | Admitting: Obstetrics and Gynecology

## 2022-07-08 ENCOUNTER — Ambulatory Visit: Payer: Medicaid Other

## 2022-07-25 ENCOUNTER — Encounter: Payer: Self-pay | Admitting: *Deleted

## 2022-07-27 ENCOUNTER — Telehealth: Payer: Medicaid Other | Admitting: Family Medicine

## 2022-07-27 DIAGNOSIS — R0789 Other chest pain: Secondary | ICD-10-CM

## 2022-07-27 MED ORDER — OMEPRAZOLE 20 MG PO CPDR: 20.0000 mg | DELAYED_RELEASE_CAPSULE | Freq: Every day | ORAL | 0 refills | Status: DC

## 2022-07-27 NOTE — Progress Notes (Signed)
  [place]   Chest pain needs to be r/o cardiac

## 2022-10-14 ENCOUNTER — Emergency Department (HOSPITAL_COMMUNITY)
Admission: EM | Admit: 2022-10-14 | Discharge: 2022-10-14 | Disposition: A | Discharge status: Home / Self Care | Payer: Medicaid Other | Attending: Emergency Medicine | Admitting: Emergency Medicine

## 2022-10-14 ENCOUNTER — Other Ambulatory Visit: Payer: Self-pay

## 2022-10-14 ENCOUNTER — Encounter (HOSPITAL_COMMUNITY): Payer: Self-pay

## 2022-10-14 DIAGNOSIS — M549 Dorsalgia, unspecified: Secondary | ICD-10-CM | POA: Diagnosis not present

## 2022-10-14 DIAGNOSIS — Z87891 Personal history of nicotine dependence: Secondary | ICD-10-CM | POA: Diagnosis not present

## 2022-10-14 DIAGNOSIS — K0889 Other specified disorders of teeth and supporting structures: Secondary | ICD-10-CM | POA: Diagnosis not present

## 2022-10-14 DIAGNOSIS — R8289 Other abnormal findings on cytological and histological examination of urine: Secondary | ICD-10-CM | POA: Insufficient documentation

## 2022-10-14 DIAGNOSIS — R350 Frequency of micturition: Secondary | ICD-10-CM | POA: Diagnosis not present

## 2022-10-14 DIAGNOSIS — R300 Dysuria: Secondary | ICD-10-CM | POA: Diagnosis not present

## 2022-10-14 LAB — CBC
HCT: 42.5 % (ref 36.0–46.0)
Hemoglobin: 13.7 g/dL (ref 12.0–15.0)
MCH: 27 pg (ref 26.0–34.0)
MCHC: 32.2 g/dL (ref 30.0–36.0)
MCV: 83.8 fL (ref 80.0–100.0)
Platelets: 370 10*3/uL (ref 150–400)
RBC: 5.07 MIL/uL (ref 3.87–5.11)
RDW: 15.3 % (ref 11.5–15.5)
WBC: 10.1 10*3/uL (ref 4.0–10.5)
nRBC: 0 % (ref 0.0–0.2)

## 2022-10-14 LAB — URINALYSIS, ROUTINE W REFLEX MICROSCOPIC
Bilirubin Urine: NEGATIVE
Glucose, UA: NEGATIVE mg/dL
Ketones, ur: NEGATIVE mg/dL
Leukocytes,Ua: NEGATIVE
Nitrite: NEGATIVE
Protein, ur: NEGATIVE mg/dL
Specific Gravity, Urine: 1.013 (ref 1.005–1.030)
pH: 5 (ref 5.0–8.0)

## 2022-10-14 LAB — I-STAT BETA HCG BLOOD, ED (MC, WL, AP ONLY): I-stat hCG, quantitative: <5 m[IU]/mL (ref <5)

## 2022-10-14 LAB — BASIC METABOLIC PANEL
Anion gap: 11 (ref 5–15)
BUN: 10 mg/dL (ref 6–20)
CO2: 19 mmol/L — ABNORMAL LOW (ref 22–32)
Calcium: 10 mg/dL (ref 8.9–10.3)
Chloride: 108 mmol/L (ref 98–111)
Creatinine, Ser: 0.85 mg/dL (ref 0.44–1.00)
GFR, Estimated: >60 mL/min (ref >60)
Glucose, Bld: 148 mg/dL — ABNORMAL HIGH (ref 70–99)
Potassium: 3.8 mmol/L (ref 3.5–5.1)
Sodium: 138 mmol/L (ref 135–145)

## 2022-10-14 MED ORDER — SULFAMETHOXAZOLE-TRIMETHOPRIM 800-160 MG PO TABS: 1.0000 | ORAL_TABLET | Freq: Two times a day (BID) | ORAL | 0 refills | Status: AC

## 2022-10-14 MED ORDER — AMOXICILLIN 500 MG PO CAPS
500.0000 mg | ORAL_CAPSULE | Freq: Three times a day (TID) | ORAL | 0 refills | Status: DC
Start: 2022-10-14 — End: 2023-05-10

## 2022-10-14 MED ORDER — PENICILLIN V POTASSIUM 500 MG PO TABS: 500.0000 mg | ORAL_TABLET | Freq: Four times a day (QID) | ORAL | 0 refills | Status: DC

## 2022-10-14 MED ORDER — OXYCODONE-ACETAMINOPHEN 5-325 MG PO TABS
1.0000 | ORAL_TABLET | Freq: Once | ORAL | Status: AC
  Administered 2022-10-14: 1 via ORAL
  Filled 2022-10-14: qty 1

## 2022-10-14 NOTE — ED Provider Notes (Addendum)
[facility] EMERGENCY DEPARTMENT Provider Note   CSN: [id number] Arrival date & time: 10/14/22  1203     History  Chief Complaint  Patient presents with   Infection   Dental Pain    Christine Orozco is a 37 y.o. female.   Dental Pain   37 year old female presents emergency department with complaints of right upper dental pain, bilateral back pain as well as urinary symptoms.  Patient has known fractured tooth right upper molar which she states has intermittently caused her problems months.  She states the pain usually responds to Tylenol and ibuprofen, which was not responsive earlier this morning.  Has dentist appointment approximate 3 weeks.  She is also complaining of dysuria, urinary frequency and bilateral back pain for the past 2-2 and half weeks.  States the pain is worsened with movement and radiates down to bilateral buttocks.  States she is concerned for kidney infection.  Denies fever, chills, night sweats, anterior abdominal pain, nausea, vomiting, vaginal symptoms, change in bowel habits.  Past medical history significant for hepatitis B, anxiety,  Home Medications Prior to Admission medications   Medication Sig Start Date End Date Taking? Authorizing Provider  amoxicillin (AMOXIL) 500 MG capsule Take 1 capsule (500 mg total) by mouth 3 (three) times daily. 10/14/22  Yes Sherian Maroon A, PA  sulfamethoxazole-trimethoprim (BACTRIM DS) 800-160 MG tablet Take 1 tablet by mouth 2 (two) times daily for 10 days. 10/14/22 10/24/22 Yes Sherian Maroon A, PA  omeprazole (PRILOSEC) 20 MG capsule Take 1 capsule (20 mg total) by mouth daily. 07/27/22   Freddy Finner, NP  Prenatal Vit-Fe Fumarate-FA (PREPLUS) 27-1 MG TABS Take 1 tablet by mouth daily. 12/03/21   Adam Phenix, MD      Allergies    Beef-derived products, Ultram [tramadol], Augmentin [amoxicillin-pot clavulanate], and Sudafed [pseudoephedrine]    Review of Systems   Review of Systems  All other  systems reviewed and are negative.   Physical Exam Updated Vital Signs BP (!) 142/99 (BP Location: Right Arm)   Pulse 87   Temp 98.8 F (37.1 C) (Oral)   Resp 19   Ht 5\' 2"  (1.575 m)   Wt 84.8 kg   SpO2 99%   BMI 34.20 kg/m  Physical Exam Vitals and nursing note reviewed.  Constitutional:      General: She is not in acute distress.    Appearance: She is well-developed.  HENT:     Head: Normocephalic and atraumatic.     Mouth/Throat:      Comments: Uvula midline rise symmetrical phonation.  Tonsils are 1+ bilaterally with no obvious exudate.  Affected tooth as indicated above with no obvious palpable fluctuance along gumline.  Gumline is tender to palpation.  No evidence of sublingual or submandibular swelling. Eyes:     Conjunctiva/sclera: Conjunctivae normal.  Cardiovascular:     Rate and Rhythm: Normal rate and regular rhythm.     Heart sounds: No murmur heard. Pulmonary:     Effort: Pulmonary effort is normal. No respiratory distress.     Breath sounds: Normal breath sounds. No wheezing, rhonchi or rales.  Abdominal:     Palpations: Abdomen is soft.     Tenderness: There is no abdominal tenderness.     Comments: CVA versus musculoskeletal tenderness.  Musculoskeletal:        General: No swelling.     Cervical back: Neck supple.     Comments: No midline cervical, thoracic, lumbar tenderness with no obvious  step-off or deformity noted.  Paraspinal tenderness to the lumbar region with radiation down to right and left buttock.  Strength symmetric bilateral lower extremities.  DTR symmetric and equal bilaterally.  Pedal pulses symmetrical full bilaterally.  Skin:    General: Skin is warm and dry.     Capillary Refill: Capillary refill takes less than 2 seconds.  Neurological:     Mental Status: She is alert.  Psychiatric:        Mood and Affect: Mood normal.     ED Results / Procedures / Treatments   Labs (all labs ordered are listed, but only abnormal results are  displayed) Labs Reviewed  BASIC METABOLIC PANEL - Abnormal; Notable for the following components:      Result Value   CO2 19 (*)    Glucose, Bld 148 (*)    All other components within normal limits  URINALYSIS, ROUTINE W REFLEX MICROSCOPIC - Abnormal; Notable for the following components:   APPearance HAZY (*)    Hgb urine dipstick SMALL (*)    Bacteria, UA RARE (*)    All other components within normal limits  URINE CULTURE  CBC  I-STAT BETA HCG BLOOD, ED ([facility], [facility], [facility] ONLY)    EKG None  Radiology No results found.  Procedures Procedures    Medications Ordered in ED Medications  oxyCODONE-acetaminophen (PERCOCET/ROXICET) 5-325 MG per tablet 1 tablet (1 tablet Oral Given 10/14/22 1328)    ED Course/ Medical Decision Making/ A&P Clinical Course as of 10/14/22 1347  Fri Oct 14, 2022  1338 Consulted ED pharmacy regarding the patient for direction pertaining to empiric treatment for patient's urinary symptoms as well as dental infection.  Recommendation of amoxicillin and Bactrim of which will be provided. [CR]    Clinical Course User Index [CR] Peter Garter, PA                           Medical Decision Making Risk Prescription drug management.   This patient presents to the ED for concern of back pain/dental pain, this involves an extensive number of treatment options, and is a complaint that carries with it a high risk of complications and morbidity.  The differential diagnosis includes Ludwig angina, periapical abscess, peritonsillar abscess, necrotizing ulcerative gingivitis, cauda equina, nephrolithiasis, pyelonephritis, MSK, fracture, strain/sprain, dislocation, spinal epidural abscess   Co morbidities that complicate the patient evaluation  See HPI   Additional history obtained:  Additional history obtained from EMR External records from outside source obtained and reviewed including hospital records   Lab Tests:  I Ordered, and personally  interpreted labs.  The pertinent results include: No leukocytosis noted.  No evidence of anemia.  Platelets within normal range.  Mild decrease in bicarb of which patient was tolerating p.o. and supplemented orally.  No renal dysfunction.  UA significant for rare bacteria, 11-20 WBC with negative leukocyte and nitrite.  Urine culture pending.  Beta-hCG negative.   Imaging Studies ordered:  N/a   Cardiac Monitoring: / EKG:  The patient was maintained on a cardiac monitor.  I personally viewed and interpreted the cardiac monitored which showed an underlying rhythm of: Sinus rhythm   Consultations Obtained:  N/a   Problem List / ED Course / Critical interventions / Medication management  Back pain/urinary symptoms/dental pain Percocet was ordered from the triage area for patient's pain reevaluation of the patient after these medicines showed that the patient improved I have reviewed the patients home  medicines and have made adjustments as needed   Social Determinants of Health:  Former cigarette use.  Denies illicit drug use.   Test / Admission - Considered:  Back pain/urinary symptoms/dental pain Vitals signs significant for hypertension with blood pressure 142/99.  Recommend follow-up with primary care regarding ablation blood pressure.. Otherwise within normal range and stable throughout visit. Laboratory/imaging studies significant for: See above Patient with evidence of urinary symptoms with UA without leukocytes or nitrites; will treat empirically for cystitis given patient's symptomatic complaint.  Patient also with evidence of dental pain without evidence of periapical abscess, Ludwig angina, peritonsillar abscess.  Patient tolerating p.o. without difficulty and no acute respiratory distress.  Consulted pharmacy regarding the patient given her allergy to Augmentin and recommendation was made for treatment with Bactrim and amoxicillin concurrently of which was provided.   Patient recommended close follow-up with dentist outpatient for assessment of affected tooth as well as follow-up with primary care regarding patient's urinary symptoms.  Treatment plan discussed at length with patient and she acknowledged understanding was agreeable to said plan. Worrisome signs and symptoms were discussed with the patient, and the patient acknowledged understanding to return to the ED if noticed. Patient was stable upon discharge.          Final Clinical Impression(s) / ED Diagnoses Final diagnoses:  Pain, dental    Rx / DC Orders ED Discharge Orders          Ordered    penicillin v potassium (VEETID) 500 MG tablet  4 times daily,   Status:  Discontinued        10/14/22 1331    sulfamethoxazole-trimethoprim (BACTRIM DS) 800-160 MG tablet  2 times daily        10/14/22 1339    amoxicillin (AMOXIL) 500 MG capsule  3 times daily        10/14/22 1339              Peter Garter, [place] 10/14/22 1348    Peter Garter, [place] 10/14/22 1410    Gwyneth Sprout, MD 10/15/22 1518

## 2022-10-14 NOTE — Discharge Instructions (Signed)
Note the visit emergency department today was overall reassuring.  As discussed, we will put you on 2 different antibiotics called Bactrim and amoxicillin to take as directed.  Follow-up with dentist to have affected tooth cared for.  Continue take Tylenol/Motrin as needed for pain.  Please do not hesitate to return to emergency department for worrisome signs and symptoms we discussed become apparent.

## 2022-10-14 NOTE — ED Provider Triage Note (Signed)
Emergency Medicine Provider Triage Evaluation Note  Christine Orozco , a 37 y.o. female  was evaluated in triage.  Pt complains of dental pain on right due to known broken teeth, patient reports that she has a dental appointment in around 3 weeks but thinks that is becoming infected and was curious about getting some antibiotics to help with her pain.  She is trying over-the-counter pain medicine with minimal relief.  Patient also reports that she has been having some dysuria, urinary frequency and left-sided flank pain.  Patient reports that she has had a history of kidney stones but since the pain has been ongoing for 2-1/2 weeks at this time she feels fairly confident that she is not having another kidney stone, reports the pain is not very sharp in nature.  Review of Systems  Positive: Dysuria, dental pain Negative: Fever, chills, difficulty swallowing  Physical Exam  BP (!) 142/99 (BP Location: Right Arm)   Pulse 87   Temp 98.8 F (37.1 C) (Oral)   Resp 19   Ht 5\' 2"  (1.575 m)   Wt 84.8 kg   SpO2 99%   BMI 34.20 kg/m  Gen:   Awake, no distress   Resp:  Normal effort  MSK:   Moves extremities without difficulty  Other:  Ultiva broken teeth on right lower jaw, no evidence of abscess, uvula midline, no difficulty swallowing, she has some tenderness palpation of the left flank without positive CVA tenderness.  No rebound, rigidity, guarding throughout.  Medical Decision Making  Medically screening exam initiated at 12:26 PM.  Appropriate orders placed.  Christine Orozco was informed that the remainder of the evaluation will be completed by another provider, this initial triage assessment does not replace that evaluation, and the importance of remaining in the ED until their evaluation is complete.  Workup initiated   Anselmo Pickler, [place] 10/14/22 1228

## 2022-10-14 NOTE — ED Triage Notes (Signed)
Pt came in via POV d/t what she feels like is a kidney or urinary infection that is causing her back pain for a bout 2 weeks. Endorses some burning when she pees & frequency but denies hematuria. She also states that her tooth had a filling coming out recently & her mouth is swelling up & her dentist appointment is too far out to deal with the pain (per pt.) A/Ox4, 7/10 pain, denies n/v or fevers.

## 2022-10-15 LAB — URINE CULTURE

## 2023-03-04 ENCOUNTER — Telehealth: Payer: Medicaid Other | Admitting: Physician Assistant

## 2023-03-04 DIAGNOSIS — H1031 Unspecified acute conjunctivitis, right eye: Secondary | ICD-10-CM | POA: Diagnosis not present

## 2023-03-04 MED ORDER — POLYMYXIN B-TRIMETHOPRIM 10000-0.1 UNIT/ML-% OP SOLN: 1.0000 [drp] | Freq: Four times a day (QID) | OPHTHALMIC | 0 refills | Status: DC

## 2023-03-04 NOTE — Progress Notes (Signed)

## 2023-03-05 ENCOUNTER — Telehealth: Payer: Medicaid Other | Admitting: Physician Assistant

## 2023-03-05 DIAGNOSIS — K0889 Other specified disorders of teeth and supporting structures: Secondary | ICD-10-CM

## 2023-03-05 MED ORDER — CLINDAMYCIN HCL 300 MG PO CAPS: 300.0000 mg | ORAL_CAPSULE | Freq: Three times a day (TID) | ORAL | 0 refills | Status: AC

## 2023-03-05 NOTE — Progress Notes (Signed)
E-Visit for Dental Pain  We are sorry that you are not feeling well.  Here is how we plan to help!  Based on what you have shared with me in the questionnaire, it sounds like you may have a dental infection.   Clindamycin 300mg  3 times a day for 7 days  It is imperative that you see a dentist within 10 days of this eVisit to determine the cause of the dental pain and be sure it is adequately treated  Recommend warm compress to the area under your eye.    A toothache or tooth pain is caused when the nerve in the root of a tooth or surrounding a tooth is irritated. Dental (tooth) infection, decay, injury, or loss of a tooth are the most common causes of dental pain. Pain may also occur after an extraction (tooth is pulled out). Pain sometimes originates from other areas and radiates to the jaw, thus appearing to be tooth pain.Bacteria growing inside your mouth can contribute to gum disease and dental decay, both of which can cause pain. A toothache occurs from inflammation of the central portion of the tooth called pulp. The pulp contains nerve endings that are very sensitive to pain. Inflammation to the pulp or pulpitis may be caused by dental cavities, trauma, and infection.    HOME CARE:   For toothaches: Over-the-counter pain medications such as acetaminophen or ibuprofen may be used. Take these as directed on the package while you arrange for a dental appointment. Avoid very cold or hot foods, because they may make the pain worse. You may get relief from biting on a cotton ball soaked in oil of cloves. You can get oil of cloves at most drug stores.  For jaw pain:  Aspirin may be helpful for problems in the joint of the jaw in adults. If pain happens every time you open your mouth widely, the temporomandibular joint (TMJ) may be the source of the pain. Yawning or taking a large bite of food may worsen the pain. An appointment with your doctor or dentist will help you find the cause.      GET HELP RIGHT AWAY IF:  You have a high fever or chills If you have had a recent head or face injury and develop headache, light headedness, nausea, vomiting, or other symptoms that concern you after an injury to your face or mouth, you could have a more serious injury in addition to your dental injury. A facial rash associated with a toothache: This condition may improve with medication. Contact your doctor for them to decide what is appropriate. Any jaw pain occurring with chest pain: Although jaw pain is most commonly caused by dental disease, it is sometimes referred pain from other areas. People with heart disease, especially people who have had stents placed, people with diabetes, or those who have had heart surgery may have jaw pain as a symptom of heart attack or angina. If your jaw or tooth pain is associated with lightheadedness, sweating, or shortness of breath, you should see a doctor as soon as possible. Trouble swallowing or excessive pain or bleeding from gums: If you have a history of a weakened immune system, diabetes, or steroid use, you may be more susceptible to infections. Infections can often be more severe and extensive or caused by unusual organisms. Dental and gum infections in people with these conditions may require more aggressive treatment. An abscess may need draining or IV antibiotics, for example.  MAKE SURE YOU  Understand these instructions. Will watch your condition. Will get help right away if you are not doing well or get worse.  Thank you for choosing an e-visit.  Your e-visit answers were reviewed by a board certified advanced clinical practitioner to complete your personal care plan. Depending upon the condition, your plan could have included both over the counter or prescription medications.  Please review your pharmacy choice. Make sure the pharmacy is open so you can pick up prescription now. If there is a problem, you may contact your provider  through [employer] and have the prescription routed to another pharmacy.  Your safety is important to [place]. If you have drug allergies check your prescription carefully.   For the next 24 hours you can use MyChart to ask questions about today's visit, request a non-urgent call back, or ask for a work or school excuse. You will get an email in the next two days asking about your experience. I hope that your e-visit has been valuable and will speed your recovery.  I have spent 5 minutes in review of e-visit questionnaire, review and updating patient chart, medical decision making and response to patient.   Tylene Fantasia Ward, PA-C

## 2023-04-22 ENCOUNTER — Telehealth: Payer: Medicaid Other | Admitting: Nurse Practitioner

## 2023-04-22 DIAGNOSIS — J019 Acute sinusitis, unspecified: Secondary | ICD-10-CM

## 2023-04-22 DIAGNOSIS — K0889 Other specified disorders of teeth and supporting structures: Secondary | ICD-10-CM

## 2023-04-22 DIAGNOSIS — B9789 Other viral agents as the cause of diseases classified elsewhere: Secondary | ICD-10-CM | POA: Diagnosis not present

## 2023-04-22 MED ORDER — CLINDAMYCIN HCL 300 MG PO CAPS: 300.0000 mg | ORAL_CAPSULE | Freq: Three times a day (TID) | ORAL | 0 refills | Status: DC

## 2023-04-22 MED ORDER — NAPROXEN 500 MG PO TABS: 500.0000 mg | ORAL_TABLET | Freq: Two times a day (BID) | ORAL | 1 refills | Status: DC

## 2023-04-22 MED ORDER — FLUTICASONE PROPIONATE 50 MCG/ACT NA SUSP: 2.0000 | Freq: Every day | NASAL | 6 refills | Status: DC

## 2023-04-22 NOTE — Progress Notes (Signed)
E-Visit for Dental Pain  We are sorry that you are not feeling well.  Here is how we plan to help!  Based on what you have shared with me in the questionnaire, it sounds like you have dental pain  Clindamycin 300mg 3 times a day for 7 days and Naprosyn 500mg 2 times a day for 7 days for discomfort  It is imperative that you see a dentist within 10 days of this eVisit to determine the cause of the dental pain and be sure it is adequately treated  A toothache or tooth pain is caused when the nerve in the root of a tooth or surrounding a tooth is irritated. Dental (tooth) infection, decay, injury, or loss of a tooth are the most common causes of dental pain. Pain may also occur after an extraction (tooth is pulled out). Pain sometimes originates from other areas and radiates to the jaw, thus appearing to be tooth pain.Bacteria growing inside your mouth can contribute to gum disease and dental decay, both of which can cause pain. A toothache occurs from inflammation of the central portion of the tooth called pulp. The pulp contains nerve endings that are very sensitive to pain. Inflammation to the pulp or pulpitis may be caused by dental cavities, trauma, and infection.    HOME CARE:   For toothaches: Over-the-counter pain medications such as acetaminophen or ibuprofen may be used. Take these as directed on the package while you arrange for a dental appointment. Avoid very cold or hot foods, because they may make the pain worse. You may get relief from biting on a cotton ball soaked in oil of cloves. You can get oil of cloves at most drug stores.  For jaw pain:  Aspirin may be helpful for problems in the joint of the jaw in adults. If pain happens every time you open your mouth widely, the temporomandibular joint (TMJ) may be the source of the pain. Yawning or taking a large bite of food may worsen the pain. An appointment with your doctor or dentist will help you find the cause.     GET HELP  RIGHT AWAY IF:  You have a high fever or chills If you have had a recent head or face injury and develop headache, light headedness, nausea, vomiting, or other symptoms that concern you after an injury to your face or mouth, you could have a more serious injury in addition to your dental injury. A facial rash associated with a toothache: This condition may improve with medication. Contact your doctor for them to decide what is appropriate. Any jaw pain occurring with chest pain: Although jaw pain is most commonly caused by dental disease, it is sometimes referred pain from other areas. People with heart disease, especially people who have had stents placed, people with diabetes, or those who have had heart surgery may have jaw pain as a symptom of heart attack or angina. If your jaw or tooth pain is associated with lightheadedness, sweating, or shortness of breath, you should see a doctor as soon as possible. Trouble swallowing or excessive pain or bleeding from gums: If you have a history of a weakened immune system, diabetes, or steroid use, you may be more susceptible to infections. Infections can often be more severe and extensive or caused by unusual organisms. Dental and gum infections in people with these conditions may require more aggressive treatment. An abscess may need draining or IV antibiotics, for example.  MAKE SURE YOU   Understand these instructions.   Will watch your condition. Will get help right away if you are not doing well or get worse.  Thank you for choosing an e-visit.  Your e-visit answers were reviewed by a board certified advanced clinical practitioner to complete your personal care plan. Depending upon the condition, your plan could have included both over the counter or prescription medications.  Please review your pharmacy choice. Make sure the pharmacy is open so you can pick up prescription now. If there is a problem, you may contact your provider through MyChart  messaging and have the prescription routed to another pharmacy.  Your safety is important to us. If you have drug allergies check your prescription carefully.   For the next 24 hours you can use MyChart to ask questions about today's visit, request a non-urgent call back, or ask for a work or school excuse. You will get an email in the next two days asking about your experience. I hope that your e-visit has been valuable and will speed your recovery  Christine Martin, FNP   5-10 minutes spent reviewing and documenting in chart.  

## 2023-04-22 NOTE — Progress Notes (Signed)
E-Visit for Sinus Problems  We are sorry that you are not feeling well.  Here is how we plan to help!  Based on what you have shared with me it looks like you have sinusitis.  Sinusitis is inflammation and infection in the sinus cavities of the head.  Based on your presentation I believe you most likely have Acute Viral Sinusitis.This is an infection most likely caused by a virus. There is not specific treatment for viral sinusitis other than to help you with the symptoms until the infection runs its course.  You may use an oral decongestant such as Mucinex D or if you have glaucoma or high blood pressure use plain Mucinex. Saline nasal spray help and can safely be used as often as needed for congestion, I have prescribed: Fluticasone nasal spray two sprays in each nostril once a day  Some authorities believe that zinc sprays or the use of Echinacea may shorten the course of your symptoms.  Sinus infections are not as easily transmitted as other respiratory infection, however we still recommend that you avoid close contact with loved ones, especially the very young and elderly.  Remember to wash your hands thoroughly throughout the day as this is the number one way to prevent the spread of infection!  Home Care: Only take medications as instructed by your medical team. Do not take these medications with alcohol. A steam or ultrasonic humidifier can help congestion.  You can place a towel over your head and breathe in the steam from hot water coming from a faucet. Avoid close contacts especially the very young and the elderly. Cover your mouth when you cough or sneeze. Always remember to wash your hands.  Get Help Right Away If: You develop worsening fever or sinus pain. You develop a severe head ache or visual changes. Your symptoms persist after you have completed your treatment plan.  Make sure you Understand these instructions. Will watch your condition. Will get help right away if you  are not doing well or get worse.   Thank you for choosing an e-visit.  Your e-visit answers were reviewed by a board certified advanced clinical practitioner to complete your personal care plan. Depending upon the condition, your plan could have included both over the counter or prescription medications.  Please review your pharmacy choice. Make sure the pharmacy is open so you can pick up prescription now. If there is a problem, you may contact your provider through MyChart messaging and have the prescription routed to another pharmacy.  Your safety is important to us. If you have drug allergies check your prescription carefully.   For the next 24 hours you can use MyChart to ask questions about today's visit, request a non-urgent call back, or ask for a work or school excuse. You will get an email in the next two days asking about your experience. I hope that your e-visit has been valuable and will speed your recovery.  Cherylee-Margaret Martin, FNP   5-10 minutes spent reviewing and documenting in chart.  

## 2023-05-09 ENCOUNTER — Telehealth: Payer: Medicaid Other | Admitting: Physician Assistant

## 2023-05-09 DIAGNOSIS — H9202 Otalgia, left ear: Secondary | ICD-10-CM | POA: Diagnosis not present

## 2023-05-10 MED ORDER — AZITHROMYCIN 250 MG PO TABS: ORAL_TABLET | ORAL | 0 refills | Status: AC

## 2023-05-10 NOTE — Addendum Note (Signed)
Addended by: Marcelline Mates C on: 05/10/2023 08:00 AM   Modules accepted: Orders, Level of Service

## 2023-05-10 NOTE — Progress Notes (Addendum)
Hi Christine Orozco,  I will start treated for suspected ear infection, but giving recent history if there is any continued symptoms or recurrence of jaw pain despite treatment, you will need to be seen in person.     E-Visit for Ear Pain - Acute Otitis Media   We are sorry that you are not feeling well. Here is how we plan to help!  Based on what you have shared with me it looks like you have Acute Otitis Media.  Acute Otitis Media is an infection of the middle or "inner" ear. This type of infection can cause redness, inflammation, and fluid buildup behind the tympanic membrane (ear drum).  The usual symptoms include: Earache/Pain Fever Upper respiratory symptoms Lack of energy/Fatigue/Malaise Slight hearing loss gradually worsening- if the inner ear fills with fluid What causes middle ear infections? Most middle ear infections occur when an infection such as a cold, leads to a build-up of mucus in the middle ear and causes the Eustachian tube (a thin tube that runs from the middle ear to the back of the nose) to become swollen or blocked.   This means mucus can't drain away properly, making it easier for an infection to spread into the middle ear.  How middle ear infections are treated: Most ear infections clear up within three to five days and don't need any specific treatment. If necessary, tylenol or ibuprofen should be used to relieve pain and a high temperature.  If you develop a fever higher than 102, or any significantly worsening symptoms, this could indicate a more serious infection moving to the middle/inner and needs face to face evaluation in an office by a provider.   Antibiotics aren't routinely used to treat middle ear infections, although they may occasionally be prescribed if symptoms persist or are particularly severe. Given your presentation,   I have prescribed Azithromycin 250 mg two tablets by mouth on day 1, then 1 tablet by mouth daily until completed   Your symptoms should  improve over the next 3 days and should resolve in about 7 days. Be sure to complete ALL of the prescription(s) given.  HOME CARE: Wash your hands frequently. If you are prescribed an ear drop, do not place the tip of the bottle on your ear or touch it with your fingers. You can take Acetaminophen 650 mg every 4-6 hours as needed for pain.  If pain is severe or moderate, you can apply a heating pad (set on low) or hot water bottle (wrapped in a towel) to outer ear for 20 minutes.  This will also increase drainage.  GET HELP RIGHT AWAY IF: Fever is over 102.2 degrees. You develop progressive ear pain or hearing loss. Ear symptoms persist longer than 3 days after treatment.  MAKE SURE YOU: Understand these instructions. Will watch your condition. Will get help right away if you are not doing well or get worse.  Thank you for choosing an e-visit.  Your e-visit answers were reviewed by a board certified advanced clinical practitioner to complete your personal care plan. Depending upon the condition, your plan could have included both over the counter or prescription medications.  Please review your pharmacy choice. Make sure the pharmacy is open so you can pick up the prescription now. If there is a problem, you may contact your provider through [employer] and have the prescription routed to another pharmacy.  Your safety is important to [place]. If you have drug allergies check your prescription carefully.   For the  next 24 hours you can use MyChart to ask questions about today's visit, request a non-urgent call back, or ask for a work or school excuse. You will get an email with a survey after your eVisit asking about your experience. We would appreciate your feedback. I hope that your e-visit has been valuable and will aid in your recovery.

## 2023-07-21 ENCOUNTER — Ambulatory Visit: Payer: Medicaid Other | Admitting: Family Medicine

## 2023-08-07 ENCOUNTER — Telehealth: Payer: Self-pay | Admitting: Family Medicine

## 2023-08-07 NOTE — Telephone Encounter (Signed)
Patient called in stating she wanted to get prenatal care with [place] because she has been getting te run around. As per patient she called the [facility] and was told she missed appointments and owe money so she can not be seen there. Patient called [place] stating she was given our number.

## 2023-08-10 ENCOUNTER — Ambulatory Visit (INDEPENDENT_AMBULATORY_CARE_PROVIDER_SITE_OTHER): Payer: Medicaid Other | Admitting: *Deleted

## 2023-08-10 DIAGNOSIS — Z3A22 22 weeks gestation of pregnancy: Secondary | ICD-10-CM | POA: Diagnosis not present

## 2023-08-10 DIAGNOSIS — O0990 Supervision of high risk pregnancy, unspecified, unspecified trimester: Secondary | ICD-10-CM | POA: Insufficient documentation

## 2023-08-10 DIAGNOSIS — O0992 Supervision of high risk pregnancy, unspecified, second trimester: Secondary | ICD-10-CM

## 2023-08-10 MED ORDER — BLOOD PRESSURE KIT DEVI: 1.0000 | 0 refills | Status: DC

## 2023-08-10 NOTE — Patient Instructions (Signed)

## 2023-08-10 NOTE — Progress Notes (Addendum)
New OB Intake  I connected with Christine Orozco  on 08/10/23 at  3:10 PM EST by Telephone Visit and verified that I am speaking with the correct person using two identifiers. Nurse is located at [facility] and pt is located at Home.  I discussed the limitations, risks, security and privacy concerns of performing an evaluation and management service by telephone and the availability of in person appointments. I also discussed with the patient that there may be a patient responsible charge related to this service. The patient expressed understanding and agreed to proceed.  I explained I am completing New OB Intake today. We discussed EDD of 12/13/2023, by Other Basis. Pt is [id number]. I reviewed her allergies, medications and Medical/Surgical/OB history.    Patient Active Problem List   Diagnosis Date Noted   Supervision of high risk pregnancy, antepartum 08/10/2023   Postpartum care following cesarean delivery 03/04/2022   History of cesarean section 03/04/2022   Unwanted fertility 03/04/2022    Concerns addressed today  Delivery Plans Plans to deliver at [facility]. Discussed the nature of our practice with multiple providers including residents and students. Due to the size of the practice, the delivering provider may not be the same as those providing prenatal care.   Patient is not a candidate for water birth. Offered upcoming OB visit with CNM to discuss further.  MyChart/Babyscripts MyChart access verified. I explained pt will have some visits in office and some virtually. Babyscripts instructions given and order placed. Patient verifies receipt of registration text/e-mail. Account successfully created and app downloaded.  Blood Pressure Cuff/Weight Scale Blood pressure cuff ordered for patient to pick-up from [employer]. Explained after first prenatal appt pt will check weekly and document in Babyscripts. Patient does not have weight scale; patient may purchase if they desire to  track weight weekly in Babyscripts.  Anatomy US Explained first scheduled [place] will be around 19 weeks. Anatomy US scheduled for TBD at TBD.  Interested in [place]? If yes, send referral and doula dot phrase.   Is patient a candidate for Babyscripts Optimization? No - High Risk  First visit review I reviewed new OB appt with patient. Explained pt will be seen by Dr. Clearance Coots at first visit. Discussed Avelina Laine genetic screening with patient. Considering Panorama and Horizon.. Routine prenatal labs  not collected/ virtual visit.    Last Pap Diagnosis  Date Value Ref Range Status  12/03/2021   Final   - Negative for intraepithelial lesion or malignancy (NILM)    Harrel Lemon, RN 08/10/2023  4:05 PM

## 2023-08-14 ENCOUNTER — Other Ambulatory Visit: Payer: Self-pay | Admitting: *Deleted

## 2023-08-15 DIAGNOSIS — O09899 Supervision of other high risk pregnancies, unspecified trimester: Secondary | ICD-10-CM | POA: Insufficient documentation

## 2023-08-15 DIAGNOSIS — O09529 Supervision of elderly multigravida, unspecified trimester: Secondary | ICD-10-CM | POA: Insufficient documentation

## 2023-08-15 DIAGNOSIS — Z8759 Personal history of other complications of pregnancy, childbirth and the puerperium: Secondary | ICD-10-CM | POA: Insufficient documentation

## 2023-08-23 ENCOUNTER — Encounter: Payer: Self-pay | Admitting: Obstetrics

## 2023-08-23 ENCOUNTER — Other Ambulatory Visit (HOSPITAL_COMMUNITY)
Admission: RE | Admit: 2023-08-23 | Discharge: 2023-08-23 | Disposition: A | Discharge status: Home / Self Care | Payer: Medicaid Other | Source: Ambulatory Visit | Attending: Obstetrics | Admitting: Obstetrics

## 2023-08-23 ENCOUNTER — Ambulatory Visit (INDEPENDENT_AMBULATORY_CARE_PROVIDER_SITE_OTHER): Payer: Medicaid Other | Admitting: Obstetrics

## 2023-08-23 VITALS — BP 126/87 | HR 95 | Wt 197.4 lb

## 2023-08-23 DIAGNOSIS — Z3A24 24 weeks gestation of pregnancy: Secondary | ICD-10-CM

## 2023-08-23 DIAGNOSIS — O0992 Supervision of high risk pregnancy, unspecified, second trimester: Secondary | ICD-10-CM | POA: Diagnosis not present

## 2023-08-23 DIAGNOSIS — O09892 Supervision of other high risk pregnancies, second trimester: Secondary | ICD-10-CM

## 2023-08-23 DIAGNOSIS — O0990 Supervision of high risk pregnancy, unspecified, unspecified trimester: Secondary | ICD-10-CM

## 2023-08-23 DIAGNOSIS — Z98891 History of uterine scar from previous surgery: Secondary | ICD-10-CM

## 2023-08-23 DIAGNOSIS — O42912 Preterm premature rupture of membranes, unspecified as to length of time between rupture and onset of labor, second trimester: Secondary | ICD-10-CM | POA: Diagnosis not present

## 2023-08-23 DIAGNOSIS — O09899 Supervision of other high risk pregnancies, unspecified trimester: Secondary | ICD-10-CM

## 2023-08-23 DIAGNOSIS — O0932 Supervision of pregnancy with insufficient antenatal care, second trimester: Secondary | ICD-10-CM

## 2023-08-23 DIAGNOSIS — O42919 Preterm premature rupture of membranes, unspecified as to length of time between rupture and onset of labor, unspecified trimester: Secondary | ICD-10-CM

## 2023-08-23 DIAGNOSIS — O0930 Supervision of pregnancy with insufficient antenatal care, unspecified trimester: Secondary | ICD-10-CM

## 2023-08-23 DIAGNOSIS — O26892 Other specified pregnancy related conditions, second trimester: Secondary | ICD-10-CM

## 2023-08-23 DIAGNOSIS — Z6791 Unspecified blood type, Rh negative: Secondary | ICD-10-CM

## 2023-08-23 NOTE — Progress Notes (Unsigned)
NOB in office, intake completed by phone on 08/10/23.  Pt states that she did not know that she was pregnant until last month, has not had u/s yet.  Anatomy U/S scheduled for 08/25/23 Pt states that she wants another c-section and BTL

## 2023-08-23 NOTE — Progress Notes (Unsigned)
Subjective:    Christine Orozco is being seen today for her first obstetrical visit.  This {is/is not:9024} a planned pregnancy. She is at [redacted]w[redacted]d gestation. Her obstetrical history is significant for {ob risk factors:10154}. Relationship with FOB: {fob:16621}. Patient {does/does not:19097} intend to breast feed. Pregnancy history fully reviewed.  The information documented in the HPI was reviewed and verified.  Menstrual History: OB History     Gravida  6   Para  3   Term  2   Preterm  1   AB  2   Living  3      SAB  2   IAB  0   Ectopic  0   Multiple  0   Live Births  3        Obstetric Comments  Short cervix w/ 2019 pregnancy         Menarche age: *** No LMP recorded. Patient is pregnant.    Past Medical History:  Diagnosis Date   Anxiety    Hepatitis B antibody positive    Miscarriage    Preterm premature rupture of membranes (PPROM) in second trimester, antepartum 01/06/2022   Rh negative status during pregnancy 06/07/2017   Will need rhogam   Vaginal Pap smear, abnormal     Past Surgical History:  Procedure Laterality Date   CERVICAL BIOPSY  W/ LOOP ELECTRODE EXCISION     CESAREAN SECTION N/A 01/16/2022   Procedure: CESAREAN SECTION;  Surgeon: Reinbeck Bing, MD;  Location: [facility] LD ORS;  Service: Obstetrics;  Laterality: N/A;   WISDOM TOOTH EXTRACTION      (Not in a hospital admission)  Allergies  Allergen Reactions   Beef-Derived Products Nausea And Vomiting and Other (See Comments)    Actively vomits. Alpha-gal allergy.     Ultram [Tramadol] Other (See Comments)    Hallucinate and heart stop   Augmentin [Amoxicillin-Pot Clavulanate] Other (See Comments)    Childhood, pt states she can take penicillin Has patient had a PCN reaction causing immediate rash, facial/tongue/throat swelling, SOB or lightheadedness with hypotension: No Has patient had a PCN reaction causing severe rash involving mucus membranes or skin necrosis: No Has patient had a  PCN reaction that required hospitalization No Has patient had a PCN reaction occurring within the last 10 years: No If all of the above answers are "NO", then may proceed with Cephalosporin use.    Sudafed [Pseudoephedrine] Palpitations and Other (See Comments)    hyperactivity     Social History   Tobacco Use   Smoking status: Former    Current packs/day: 0.00    Types: Cigarettes    Quit date: 10/2021    Years since quitting: 1.8   Smokeless tobacco: Never   Tobacco comments:    States she quit in 2020  Substance Use Topics   Alcohol use: Not Currently    Comment: not while preg    Family History  Adopted: Yes  Problem Relation Age of Onset   Hepatitis B Mother    Diabetes Sister      Review of Systems Constitutional: negative for weight loss Gastrointestinal: negative for vomiting Genitourinary:negative for genital lesions and vaginal discharge and dysuria Musculoskeletal:negative for back pain Behavioral/Psych: negative for abusive relationship, depression, illegal drug usage and tobacco use    Objective:    BP 126/87   Pulse 95   Wt 197 lb 6.4 oz (89.5 kg)   BMI 36.10 kg/m  General Appearance:    Alert, cooperative, no distress, appears  stated age  Head:    Normocephalic, without obvious abnormality, atraumatic  Eyes:    PERRL, conjunctiva/corneas clear, EOM's intact, fundi    benign, both eyes  Ears:    Normal TM's and external ear canals, both ears  Nose:   Nares normal, septum midline, mucosa normal, no drainage    or sinus tenderness  Throat:   Lips, mucosa, and tongue normal; teeth and gums normal  Neck:   Supple, symmetrical, trachea midline, no adenopathy;    thyroid:  no enlargement/tenderness/nodules; no carotid   bruit or JVD  Back:     Symmetric, no curvature, ROM normal, no CVA tenderness  Lungs:     Clear to auscultation bilaterally, respirations unlabored  Chest Wall:    No tenderness or deformity   Heart:    Regular rate and rhythm, S1 and  S2 normal, no murmur, rub   or gallop  Breast Exam:    No tenderness, masses, or nipple abnormality  Abdomen:     Soft, non-tender, bowel sounds active all four quadrants,    no masses, no organomegaly  Genitalia:    Normal female without lesion, discharge or tenderness  Extremities:   Extremities normal, atraumatic, no cyanosis or edema  Pulses:   2+ and symmetric all extremities  Skin:   Skin color, texture, turgor normal, no rashes or lesions  Lymph nodes:   Cervical, supraclavicular, and axillary nodes normal  Neurologic:   CNII-XII intact, normal strength, sensation and reflexes    throughout      Lab Review Urine pregnancy test Labs reviewed {YES NO:22349} Radiologic studies reviewed {YES NO:22349}  Assessment:    Pregnancy at [redacted]w[redacted]d weeks    Plan:      Prenatal vitamins.  Counseling provided regarding continued use of seat belts, cessation of alcohol consumption, smoking or use of illicit drugs; infection precautions i.e., influenza/TDAP immunizations, toxoplasmosis,CMV, parvovirus, listeria and varicella; workplace safety, exercise during pregnancy; routine dental care, safe medications, sexual activity, hot tubs, saunas, pools, travel, caffeine use, fish and methlymercury, potential toxins, hair treatments, varicose veins Weight gain recommendations per IOM guidelines reviewed: underweight/BMI< 18.5--> gain 28 - 40 lbs; normal weight/BMI 18.5 - 24.9--> gain 25 - 35 lbs; overweight/BMI 25 - 29.9--> gain 15 - 25 lbs; obese/BMI >30->gain  11 - 20 lbs Problem list reviewed and updated. FIRST/CF mutation testing/NIPT/QUAD SCREEN/fragile X/Ashkenazi Jewish population testing/Spinal muscular atrophy discussed: {requests/ordered/declines:14581}. Role of ultrasound in pregnancy discussed; fetal survey: {requests/ordered/declines:14581}. Amniocentesis discussed: {amniocentesis:14582}.  No orders of the defined types were placed in this encounter.  Orders Placed This Encounter   Procedures   Culture, OB Urine   CBC/D/Plt+RPR+Rh+ABO+RubIgG...   [facility] PRENATAL TEST    ==========Department Information========== ID: [id number] Department:[facility] FOR [facility] FOR [facility]ALTH[facility]ARE AT [facility] [address] [place] [address] Dept: [phone] Dept Fax: [phone]     Order Specific Question:   Method/type of collection:    Answer:   Clinic to manage sample collection    Order Specific Question:   Expected due date (MM/DD/YYYY):    Answer:   12/13/2023    Order Specific Question:   Is this a twin pregnancy? (viable, no vanished twin)    Answer:   Not Twin Pregnancy, Singleton    Order Specific Question:   Is this a surrogate or egg donor pregnancy?    Answer:   No    Order Specific  Question:   I want fetal sex included in the report:    Answer:   Yes    Order Specific Question:   Maternal Weight (lbs):    Answer:   35    Order Specific Question:   Which Microdeletion Panel should be ordered?    Answer:   22q11.2 Deletion    Order Specific Question:   What type of billing?    Answer:   Furniture conservator/restorer Question:   By placing this electronic order I confirm the testing ordered herein is medically necessary and this patient has been informed of the details of the genetic test(s) ordered, including the risks, benefits, and alternatives, and has consented to testing.    Answer:   Yes    Order Specific Question:   Select an order diagnosis: For additional options refer to [email or web address].[email or web address]/    Answer:   Supervision of elderly multigravida in second trimester [668308]   HORIZON Basic Panel    ==========Department Information========== ID: [id number] Department:[facility]ENTER FOR [facility] FOR [facility]ALTHCARE AT [facility] [address] [place] [address] Dept: [phone] Dept Fax: [phone]      Order Specific Question:   Specify the name or ID of a valid Horizon Custom Panel:    Answer:   HBASIC    Order Specific Question:   Is patient pregnant?    Answer:   Yes    Order Specific Question:   Ethnicity of patient:    Answer:   Caucasian    Order Specific Question:   Practice ensures that HIPAA consent is obtained and will make available to [facility] upon request?    Answer:   Yes    Order Specific Question:   By placing this electronic order I confirm the testing ordered herein is medically necessary and this patient has been informed of the details of the genetic test(s) ordered, including the risks, benefits, and alternatives, and has consented to testing.    Answer:   Yes    Order Specific Question:   What type of billing?    Answer:   Furniture conservator/restorer Question:   Select an order diagnosis: For additional options refer to [email or web address].[email or web address]/    Answer:   Supervision of elderly multigravida, second trimester [857986]    Order Specific Question:   Tay-Sachs add-on test?    Answer:   No    Follow up in {numbers 0-4:31231} weeks.  I have spent a total of *** minutes of face-to-face and non-face-to-face time, excluding clinical staff time, reviewing notes and preparing to see patient, ordering tests and/or medications, and counseling the patient.    Brock Bad, MD, FACOG Attending Obstetrician & Gynecologist, [facility] for [facility], [facility], [place] 08/23/2023

## 2023-08-24 LAB — CBC/D/PLT+RPR+RH+ABO+RUBIGG...
Antibody Screen: NEGATIVE
Basophils Absolute: 0.1 10*3/uL (ref 0.0–0.2)
Basos: 0 %
EOS (ABSOLUTE): 0.1 10*3/uL (ref 0.0–0.4)
Eos: 1 %
HCV Ab: NONREACTIVE
HIV Screen 4th Generation wRfx: NONREACTIVE
Hematocrit: 36.3 % (ref 34.0–46.6)
Hemoglobin: 11.7 g/dL (ref 11.1–15.9)
Hepatitis B Surface Ag: NEGATIVE
Immature Grans (Abs): 0.3 10*3/uL — ABNORMAL HIGH (ref 0.0–0.1)
Immature Granulocytes: 2 %
Lymphocytes Absolute: 2.5 10*3/uL (ref 0.7–3.1)
Lymphs: 17 %
MCH: 27.4 pg (ref 26.6–33.0)
MCHC: 32.2 g/dL (ref 31.5–35.7)
MCV: 85 fL (ref 79–97)
Monocytes Absolute: 0.6 10*3/uL (ref 0.1–0.9)
Monocytes: 4 %
Neutrophils Absolute: 11.1 10*3/uL — ABNORMAL HIGH (ref 1.4–7.0)
Neutrophils: 76 %
Platelets: 288 10*3/uL (ref 150–450)
RBC: 4.27 x10E6/uL (ref 3.77–5.28)
RDW: 15 % (ref 11.7–15.4)
RPR Ser Ql: NONREACTIVE
Rh Factor: NEGATIVE
Rubella Antibodies, IGG: 1.25 {index} (ref >0.99)
WBC: 14.5 10*3/uL — ABNORMAL HIGH (ref 3.4–10.8)

## 2023-08-24 LAB — CERVICOVAGINAL ANCILLARY ONLY
Bacterial Vaginitis (gardnerella): NEGATIVE
Candida Glabrata: NEGATIVE
Candida Vaginitis: POSITIVE — AB
Chlamydia: NEGATIVE
Comment: NEGATIVE
Comment: NEGATIVE
Comment: NEGATIVE
Comment: NEGATIVE
Comment: NEGATIVE
Comment: NORMAL
Neisseria Gonorrhea: NEGATIVE
Trichomonas: NEGATIVE

## 2023-08-24 LAB — HCV INTERPRETATION

## 2023-08-25 ENCOUNTER — Encounter: Payer: Self-pay | Admitting: *Deleted

## 2023-08-25 ENCOUNTER — Ambulatory Visit: Payer: Medicaid Other | Admitting: *Deleted

## 2023-08-25 ENCOUNTER — Other Ambulatory Visit: Payer: Self-pay | Admitting: *Deleted

## 2023-08-25 ENCOUNTER — Ambulatory Visit: Payer: Medicaid Other | Attending: Obstetrics and Gynecology

## 2023-08-25 VITALS — BP 151/77 | HR 91

## 2023-08-25 DIAGNOSIS — O0932 Supervision of pregnancy with insufficient antenatal care, second trimester: Secondary | ICD-10-CM

## 2023-08-25 DIAGNOSIS — O09522 Supervision of elderly multigravida, second trimester: Secondary | ICD-10-CM

## 2023-08-25 DIAGNOSIS — O09899 Supervision of other high risk pregnancies, unspecified trimester: Secondary | ICD-10-CM

## 2023-08-25 DIAGNOSIS — E669 Obesity, unspecified: Secondary | ICD-10-CM

## 2023-08-25 DIAGNOSIS — O99212 Obesity complicating pregnancy, second trimester: Secondary | ICD-10-CM

## 2023-08-25 DIAGNOSIS — O09529 Supervision of elderly multigravida, unspecified trimester: Secondary | ICD-10-CM | POA: Insufficient documentation

## 2023-08-25 DIAGNOSIS — O360120 Maternal care for anti-D [Rh] antibodies, second trimester, not applicable or unspecified: Secondary | ICD-10-CM

## 2023-08-25 DIAGNOSIS — O09212 Supervision of pregnancy with history of pre-term labor, second trimester: Secondary | ICD-10-CM | POA: Diagnosis not present

## 2023-08-25 DIAGNOSIS — O0990 Supervision of high risk pregnancy, unspecified, unspecified trimester: Secondary | ICD-10-CM

## 2023-08-25 DIAGNOSIS — Z8759 Personal history of other complications of pregnancy, childbirth and the puerperium: Secondary | ICD-10-CM

## 2023-08-25 DIAGNOSIS — Z3A24 24 weeks gestation of pregnancy: Secondary | ICD-10-CM

## 2023-08-25 DIAGNOSIS — O409XX0 Polyhydramnios, unspecified trimester, not applicable or unspecified: Secondary | ICD-10-CM

## 2023-08-25 LAB — CULTURE, OB URINE

## 2023-08-25 LAB — URINE CULTURE, OB REFLEX

## 2023-08-25 MED ORDER — MICONAZOLE 3 200 MG VA SUPP: 200.0000 mg | Freq: Every day | VAGINAL | 0 refills | Status: DC

## 2023-08-25 NOTE — Addendum Note (Signed)
Addended by: Reva Bores on: 08/25/2023 08:18 AM   Modules accepted: Orders

## 2023-08-29 LAB — HORIZON CUSTOM

## 2023-08-30 ENCOUNTER — Encounter: Payer: Self-pay | Admitting: Obstetrics

## 2023-08-31 LAB — PANORAMA PRENATAL TEST FULL PANEL:PANORAMA TEST PLUS 5 ADDITIONAL MICRODELETIONS: FETAL FRACTION: 5.2

## 2023-09-04 NOTE — BH Specialist Note (Signed)
Integrated Behavioral Health via Telemedicine Visit  09/15/2023 Christine Orozco [id number]  Number of Integrated Behavioral Health Clinician visits: 1- Initial Visit  Session Start time: 0915   Session End time: 1010  Total time in minutes: 55   Referring Provider: Mariel Aloe, MD Patient/Family location: Home [facility] Provider location: Center for [facility] Healthcare at [facility]  All persons participating in visit: Patient Christine Orozco and [facility] Romi Rathel   Types of Service: Individual psychotherapy and Video visit  I connected with Christine Orozco's  n/a  via  Telephone or Video Enabled Telemedicine Application  (Video is Caregility application) and verified that I am speaking with the correct person using two identifiers. Discussed confidentiality: Yes   I discussed the limitations of telemedicine and the availability of in person appointments.  Discussed there is a possibility of technology failure and discussed alternative modes of communication if that failure occurs.  I discussed that engaging in this telemedicine visit, they consent to the provision of behavioral healthcare and the services will be billed under their insurance.  Patient and/or legal guardian expressed understanding and consented to Telemedicine visit: Yes   Presenting Concerns: Patient and/or family reports the following symptoms/concerns: Ongoing depression, anxiety with panic, ADHD and life stress (brief treatment at 37yo); processing experiencing homelessness in the past; pt used to cope with anxiety by crocheting, playing video games and making TikToks, but low motivation and depression has been preventing her from doing things she used to enjoy. Pt has limited family support (lost father and grandmother, mother incarcerated, siblings in [place]); husband is her greatest support.  Duration of problem: Ongoing; Severity of problem:  moderately  severe  Patient and/or Family's Strengths/Protective Factors: Concrete supports in place (healthy food, safe environments, etc.) and Sense of purpose  Goals Addressed: Patient will:  Reduce symptoms of: anxiety, depression, and stress   Increase knowledge and/or ability of: healthy habits and stress reduction   Demonstrate ability to: Increase healthy adjustment to current life circumstances, Increase adequate support systems for patient/family, and Increase motivation to adhere to plan of care  Progress towards Goals: Ongoing  Interventions: Interventions utilized:  Solution-Focused Strategies, Psychoeducation and/or Health Education, and Link to [employer] Standardized Assessments completed: Not Needed  Patient and/or Family Response: Patient agrees with treatment plan.   Assessment: Patient currently experiencing Major depressive disorder, recurrent, moderate; Generalized anxiety disorder; ADHD; Psychosocial stress.   Patient may benefit from psychoeducation and brief therapeutic interventions regarding coping with symptoms of anxiety, depression, life stress .  Plan: Follow up with behavioral health clinician on : one month Behavioral recommendations:  -Continue taking prenatal vitamin daily as prescribed -Continue prioritizing healthy self-care (regular meals, adequate rest; allowing practical help from supportive friends and family, as needed) -Read through information on After Visit Summary; use as needed and discussed (1.apply LIEAP when applications open; 2. Obtain reduced-fare bus IDs; 3.Use [employer] to help update resume/job search; share apps with son, etc.)  Referral(s): Integrated Art gallery manager (In Clinic) and [employer]:  Transportation and [employer], [place]  I discussed the assessment and treatment plan with the patient and/or parent/guardian. They were provided an opportunity to ask questions and all were  answered. They agreed with the plan and demonstrated an understanding of the instructions.   They were advised to call back or seek an in-person evaluation if the symptoms worsen or if the condition fails to improve as anticipated.  Valetta Close Rony Ratz,  LCSW     08/10/2023    3:46 PM 12/03/2021   10:26 AM  Depression screen PHQ 2/9  Decreased Interest 1 0  Down, Depressed, Hopeless 1 0  PHQ - 2 Score 2 0  Altered sleeping 3 3  Tired, decreased energy 3 2  Change in appetite 2 0  Feeling bad or failure about yourself  1 0  Trouble concentrating 2 2  Moving slowly or fidgety/restless 3 2  Suicidal thoughts 0 0  PHQ-9 Score 16 9      08/10/2023    3:48 PM 12/03/2021   10:27 AM  GAD 7 : Generalized Anxiety Score  Nervous, Anxious, on Edge 1 2  Control/stop worrying 1 3  Worry too much - different things 1 3  Trouble relaxing 3 2  Restless 3 3  Easily annoyed or irritable 3 3  Afraid - awful might happen 1 1  Total GAD 7 Score 13 17

## 2023-09-15 ENCOUNTER — Ambulatory Visit: Payer: Medicaid Other | Admitting: Clinical

## 2023-09-15 DIAGNOSIS — F331 Major depressive disorder, recurrent, moderate: Secondary | ICD-10-CM | POA: Diagnosis not present

## 2023-09-15 DIAGNOSIS — Z658 Other specified problems related to psychosocial circumstances: Secondary | ICD-10-CM

## 2023-09-15 DIAGNOSIS — F411 Generalized anxiety disorder: Secondary | ICD-10-CM

## 2023-09-15 DIAGNOSIS — F909 Attention-deficit hyperactivity disorder, unspecified type: Secondary | ICD-10-CM

## 2023-09-15 NOTE — Patient Instructions (Addendum)
Center for [facility] at [facility] [address] [place], [place] [address] [phone] (main office) [phone] (Skylur Fuston's office)  [facility] [email or web address] Goltz (Low Income Risk manager) [email or web address].nl  [employer] (Low Income Automotive engineer) LittleDVDs.dk  [employer]portation [employer] ([place]) [address] [place] [place], [place], [place] [address] https://www.[place].gov/departments/transportation/gdot-divisions/[place]-transit-agency-public-transportation-division     Fixed-route bus services, including regional fare cards for [place], [place], [place], and [place] buses.  Reduced fare bus ID's available for Medicaid, Medicare, and "orange card" recipients.  [employer] offers curb-to-curb and door-to-door bus services for people with disabilities who are unable to use a fixed-bus route; also offers a shared-ride program.   Helpful tips:  -Routes available online and physical maps available at the main bus hub lobby (each for a specific route) -Smartphone directions often include bus routes (see the "bus" icon, next to the "car" and "walk" icons) -Routes differ on weekends, evenings and holidays, so plan ahead!  -If you have Medicaid, Medicare, or orange card, plan to obtain a reduced-fare ID to save 50% on rides. Check days and times to obtain an ID, and bring all necessary documents.   [employer] [address], [place], [place] [address] [phone] www.wheels4hope.org **REFERRAL NEEDED by specific agencies (see website), after meeting specified criteria only   /Emotional Wellbeing Apps and Websites Here are a few free apps meant to help you to  help yourself.  To find, try searching on the internet to see if the app is offered on Apple/Android devices. If your first choice doesn't come up on your device, the good news is that there are many choices! Play around with different apps to see which ones are helpful to you.    Calm This is an app meant to help increase calm feelings. Includes info, strategies, and tools for tracking your feelings.      Calm Harm  This app is meant to help with self-harm. Provides many 5-minute or 15-min coping strategies for doing instead of hurting yourself.       Healthy Minds Health Minds is a problem-solving tool to help deal with emotions and cope with stress you encounter wherever you are.      MindShift This app can help people cope with anxiety. Rather than trying to avoid anxiety, you can make an important shift and face it.      MY3  MY3 features a support system, safety plan and resources with the goal of offering a tool to use in a time of need.       My Life My Voice  This mood journal offers a simple solution for tracking your thoughts, feelings and moods. Animated emoticons can help identify your mood.       Relax Melodies Designed to help with sleep, on this app you can mix sounds and meditations for relaxation.      Smiling Mind Smiling Mind is meditation made easy: it's a simple tool that helps put a smile on your mind.        Stop, Breathe & Think  A friendly, simple guide for people through meditations for mindfulness and compassion.  Stop, Breathe and Think Kids Enter your current feelings and choose a "mission" to help you cope. Offers videos for certain moods instead of just sound recordings.       Team Orange The goal of this tool is to help teens change how they think, act, and react. This  app helps you focus on your own good feelings and experiences.      The United Station[employer] Box The United Stationers Box (VHB) contains simple tools to help patients  with coping, relaxation, distraction, and positive thinking.

## 2023-09-22 ENCOUNTER — Encounter: Payer: Self-pay | Admitting: Obstetrics and Gynecology

## 2023-09-22 ENCOUNTER — Other Ambulatory Visit: Payer: Medicaid Other

## 2023-09-22 ENCOUNTER — Ambulatory Visit (INDEPENDENT_AMBULATORY_CARE_PROVIDER_SITE_OTHER): Payer: Medicaid Other | Admitting: Obstetrics and Gynecology

## 2023-09-22 VITALS — BP 138/81 | HR 98 | Wt 204.0 lb

## 2023-09-22 DIAGNOSIS — Z3A28 28 weeks gestation of pregnancy: Secondary | ICD-10-CM | POA: Diagnosis not present

## 2023-09-22 DIAGNOSIS — O0990 Supervision of high risk pregnancy, unspecified, unspecified trimester: Secondary | ICD-10-CM

## 2023-09-22 DIAGNOSIS — O360130 Maternal care for anti-D [Rh] antibodies, third trimester, not applicable or unspecified: Secondary | ICD-10-CM

## 2023-09-22 DIAGNOSIS — O0993 Supervision of high risk pregnancy, unspecified, third trimester: Secondary | ICD-10-CM

## 2023-09-22 DIAGNOSIS — Z6791 Unspecified blood type, Rh negative: Secondary | ICD-10-CM | POA: Diagnosis not present

## 2023-09-22 DIAGNOSIS — Z23 Encounter for immunization: Secondary | ICD-10-CM

## 2023-09-22 DIAGNOSIS — O24419 Gestational diabetes mellitus in pregnancy, unspecified control: Secondary | ICD-10-CM

## 2023-09-22 DIAGNOSIS — O26899 Other specified pregnancy related conditions, unspecified trimester: Secondary | ICD-10-CM

## 2023-09-22 DIAGNOSIS — Z8759 Personal history of other complications of pregnancy, childbirth and the puerperium: Secondary | ICD-10-CM | POA: Diagnosis not present

## 2023-09-22 DIAGNOSIS — Z98891 History of uterine scar from previous surgery: Secondary | ICD-10-CM

## 2023-09-22 DIAGNOSIS — O09529 Supervision of elderly multigravida, unspecified trimester: Secondary | ICD-10-CM

## 2023-09-22 MED ORDER — RHO D IMMUNE GLOBULIN 1500 UNIT/2ML IJ SOSY
300.0000 ug | PREFILLED_SYRINGE | Freq: Once | INTRAMUSCULAR | Status: AC
  Administered 2023-09-22: 300 ug via INTRAMUSCULAR

## 2023-09-22 MED ORDER — SERTRALINE HCL 50 MG PO TABS: 50.0000 mg | ORAL_TABLET | Freq: Every day | ORAL | 1 refills | Status: DC

## 2023-09-22 NOTE — Progress Notes (Signed)
Pt states she has had hx of rectocele issues, pt is now currently having problems. Pt also feels that she may need Zoloft Rx. Pt states she had evisit last week to discuss. Pt has family hx of mental instability. PHQ-9 score 16 today.

## 2023-09-22 NOTE — Progress Notes (Signed)
   PRENATAL VISIT NOTE  Subjective:  Christine Orozco is a 37 y.o. [id number] at [redacted]w[redacted]d being seen today for ongoing prenatal care.  She is currently monitored for the following issues for this high-risk pregnancy and has Rh negative state in antepartum period; History of cesarean section; Supervision of high risk pregnancy, antepartum; AMA (advanced maternal age) multigravida 35+, unspecified trimester; Hx preterm delivery at [redacted]w[redacted]d; and History of preterm premature rupture of membranes (PPROM) on their problem list.  Patient reports no complaints.  Contractions: Not present. Vag. Bleeding: None.  Movement: Present. Denies leaking of fluid.   The following portions of the patient's history were reviewed and updated as appropriate: allergies, current medications, past family history, past medical history, past social history, past surgical history and problem list.   Objective:   Vitals:   09/22/23 0853  BP: 138/81  Pulse: 98  Weight: 204 lb (92.5 kg)    Fetal Status: Fetal Heart Rate (bpm): 130 Fundal Height: 28 cm Movement: Present     General:  Alert, oriented and cooperative. Patient is in no acute distress.  Skin: Skin is warm and dry. No rash noted.   Cardiovascular: Normal heart rate noted  Respiratory: Normal respiratory effort, no problems with respiration noted  Abdomen: Soft, gravid, appropriate for gestational age.  Pain/Pressure: Absent     Pelvic: Cervical exam deferred      Small rectocele on exam  Extremities: Normal range of motion.     Mental Status: Normal mood and affect. Normal behavior. Normal judgment and thought content.   Assessment and Plan:  Pregnancy: [id number] at [redacted]w[redacted]d 1. Supervision of high risk pregnancy, antepartum (Primary) Patient is doing well without complaints Third trimester labs and glucola next visit Rx Zoloft provided per patient request. She has taken it in the past to help with depression and anxiety - Glucose Tolerance, 2 Hours w/1 Hour -  RPR - CBC - HIV antibody (with reflex) - Tdap vaccine greater than or equal to 7yo IM - Flu vaccine trivalent PF, 6mos and older(Flulaval,Afluria,Fluarix,Fluzone)  2. [redacted] weeks gestation of pregnancy  - Glucose Tolerance, 2 Hours w/1 Hour - RPR - CBC - HIV antibody (with reflex) - Tdap vaccine greater than or equal to 7yo IM - Flu vaccine trivalent PF, 6mos and older(Flulaval,Afluria,Fluarix,Fluzone)  3. Rh negative state in antepartum period Rhogam today  4. History of preterm premature rupture of membranes (PPROM)   5. History of cesarean section Patient undecided on repeat c-section vs TOLAC. She expressed concerns regarding rectocele preventing a vaginal birth Reassuring findings on exam. Patient informed that she is a candidate for TOLAC if desired. Rectocele will be addressed postpartum Patient plans BTL consent signed today  6. AMA (advanced maternal age) multigravida 35+, unspecified trimester   Preterm labor symptoms and general obstetric precautions including but not limited to vaginal bleeding, contractions, leaking of fluid and fetal movement were reviewed in detail with the patient. Please refer to After Visit Summary for other counseling recommendations.   Return in about 2 weeks (around 10/06/2023) for in person, ROB, High risk.  Future Appointments  Date Time Provider Department Center  10/03/2023  2:15 PM [facility] NURSE [facility]  10/03/2023  2:30 PM [facility] US1 [facility]  10/13/2023 10:45 AM [facility] CLINICIAN [facility]    Catalina Antigua, MD

## 2023-09-23 LAB — CBC
Hematocrit: 33 % — ABNORMAL LOW (ref 34.0–46.6)
Hemoglobin: 10.4 g/dL — ABNORMAL LOW (ref 11.1–15.9)
MCH: 27.2 pg (ref 26.6–33.0)
MCHC: 31.5 g/dL (ref 31.5–35.7)
MCV: 86 fL (ref 79–97)
Platelets: 287 10*3/uL (ref 150–450)
RBC: 3.83 x10E6/uL (ref 3.77–5.28)
RDW: 14.8 % (ref 11.7–15.4)
WBC: 14.8 10*3/uL — ABNORMAL HIGH (ref 3.4–10.8)

## 2023-09-23 LAB — GLUCOSE TOLERANCE, 2 HOURS W/ 1HR
Glucose, 1 hour: 245 mg/dL — ABNORMAL HIGH (ref 70–179)
Glucose, 2 hour: 185 mg/dL — ABNORMAL HIGH (ref 70–152)
Glucose, Fasting: 111 mg/dL — ABNORMAL HIGH (ref 70–91)

## 2023-09-23 LAB — HIV ANTIBODY (ROUTINE TESTING W REFLEX): HIV Screen 4th Generation wRfx: NONREACTIVE

## 2023-09-23 LAB — RPR: RPR Ser Ql: NONREACTIVE

## 2023-09-25 DIAGNOSIS — O24419 Gestational diabetes mellitus in pregnancy, unspecified control: Secondary | ICD-10-CM | POA: Insufficient documentation

## 2023-09-25 MED ORDER — FERROUS SULFATE 325 (65 FE) MG PO TBEC: 325.0000 mg | DELAYED_RELEASE_TABLET | ORAL | 3 refills | Status: DC

## 2023-09-25 NOTE — Addendum Note (Signed)
Addended by: Catalina Antigua on: 09/25/2023 09:46 AM   Modules accepted: Orders

## 2023-09-29 ENCOUNTER — Telehealth: Payer: Self-pay

## 2023-09-29 MED ORDER — ACCU-CHEK GUIDE TEST VI STRP: ORAL_STRIP | 12 refills | Status: DC

## 2023-09-29 MED ORDER — ACCU-CHEK GUIDE W/DEVICE KIT: 1.0000 | PACK | Freq: Four times a day (QID) | 0 refills | Status: DC

## 2023-09-29 MED ORDER — ACCU-CHEK SOFTCLIX LANCETS MISC: 1.0000 | Freq: Four times a day (QID) | 12 refills | Status: DC

## 2023-09-29 NOTE — BH Specialist Note (Signed)
 Pt did not arrive to video visit and did not answer the phone; Left HIPPA-compliant message to call back Asher Muir from [employer] for [employer] at [facility] at  [phone] [facility] office).  ?; left MyChart message for patient.  ? ?

## 2023-09-29 NOTE — Telephone Encounter (Signed)
S/w pt and advised of results, rx, and diabetic teaching

## 2023-10-03 ENCOUNTER — Other Ambulatory Visit: Payer: Self-pay

## 2023-10-03 ENCOUNTER — Ambulatory Visit (HOSPITAL_BASED_OUTPATIENT_CLINIC_OR_DEPARTMENT_OTHER): Payer: Medicaid Other | Admitting: Maternal & Fetal Medicine

## 2023-10-03 ENCOUNTER — Encounter: Payer: Self-pay | Admitting: *Deleted

## 2023-10-03 ENCOUNTER — Other Ambulatory Visit: Payer: Self-pay | Admitting: *Deleted

## 2023-10-03 ENCOUNTER — Ambulatory Visit: Payer: Medicaid Other | Admitting: *Deleted

## 2023-10-03 ENCOUNTER — Ambulatory Visit: Payer: Medicaid Other | Attending: Obstetrics and Gynecology

## 2023-10-03 VITALS — BP 130/71 | HR 86

## 2023-10-03 DIAGNOSIS — O0933 Supervision of pregnancy with insufficient antenatal care, third trimester: Secondary | ICD-10-CM

## 2023-10-03 DIAGNOSIS — O0990 Supervision of high risk pregnancy, unspecified, unspecified trimester: Secondary | ICD-10-CM

## 2023-10-03 DIAGNOSIS — O409XX0 Polyhydramnios, unspecified trimester, not applicable or unspecified: Secondary | ICD-10-CM | POA: Insufficient documentation

## 2023-10-03 DIAGNOSIS — O99213 Obesity complicating pregnancy, third trimester: Secondary | ICD-10-CM

## 2023-10-03 DIAGNOSIS — O24410 Gestational diabetes mellitus in pregnancy, diet controlled: Secondary | ICD-10-CM | POA: Insufficient documentation

## 2023-10-03 DIAGNOSIS — Z3A29 29 weeks gestation of pregnancy: Secondary | ICD-10-CM

## 2023-10-03 DIAGNOSIS — O09523 Supervision of elderly multigravida, third trimester: Secondary | ICD-10-CM

## 2023-10-03 DIAGNOSIS — O09522 Supervision of elderly multigravida, second trimester: Secondary | ICD-10-CM | POA: Diagnosis present

## 2023-10-03 DIAGNOSIS — O360130 Maternal care for anti-D [Rh] antibodies, third trimester, not applicable or unspecified: Secondary | ICD-10-CM

## 2023-10-03 DIAGNOSIS — E669 Obesity, unspecified: Secondary | ICD-10-CM

## 2023-10-03 DIAGNOSIS — O09899 Supervision of other high risk pregnancies, unspecified trimester: Secondary | ICD-10-CM | POA: Diagnosis present

## 2023-10-03 DIAGNOSIS — Z8759 Personal history of other complications of pregnancy, childbirth and the puerperium: Secondary | ICD-10-CM | POA: Insufficient documentation

## 2023-10-03 DIAGNOSIS — O09213 Supervision of pregnancy with history of pre-term labor, third trimester: Secondary | ICD-10-CM

## 2023-10-03 MED ORDER — ACCU-CHEK GUIDE TEST VI STRP: ORAL_STRIP | 12 refills | Status: DC

## 2023-10-03 NOTE — Progress Notes (Signed)
 Patient information  Patient Name: Christine Orozco  Patient MRN:   [id number]  Referring practice: MFM Referring Provider: [facility]  MFM CONSULT  Christine Orozco is a 37 y.o. [id number] at [redacted]w[redacted]d here for ultrasound and consultation. Patient Active Problem List   Diagnosis Date Noted   Gestational diabetes mellitus (GDM) in third trimester 09/25/2023   AMA (advanced maternal age) multigravida 35+, unspecified trimester 08/15/2023   Hx preterm delivery at [redacted]w[redacted]d 08/15/2023   History of preterm premature rupture of membranes (PPROM) 08/15/2023   Supervision of high risk pregnancy, antepartum 08/10/2023   History of cesarean section 03/04/2022   Rh negative state in antepartum period 06/07/2017   RE GMDA1: The patient reports that she has yet to start checking her blood sugars because her prescription was not correct.  I encouraged her to contact her OB/GYN to correct this.  We discussed the importance of blood sugar control during pregnancy to limit adverse outcomes such as abnormal fetal growth, low levels of fetal oxygen that may result in stillbirth as well as need for early delivery due to uncontrolled blood sugars.  She reports ported that she understood and will call her OB today or the pharmacy to correct her prescription.  Also discussed the blood sugar goals of at least 50% of the time having a fasting blood sugar of less than 95 and a 2-hour postprandial blood sugar less than 120.  Serial growth ultrasounds to be needed every 4 to 6 weeks with possibly antenatal testing starting around 32 weeks if she requires medicine to achieve proper glycemic control.  Also discussed the importance of diet and limiting carbohydrates and sugars.  Sonographic findings Single intrauterine pregnancy at 29w 6d.  Fetal cardiac activity:  Observed and appears normal. Presentation: Breech. Interval fetal anatomy appears normal. Fetal biometry shows the estimated fetal weight at the 29  percentile. Amniotic fluid volume: Within normal limits. MVP: 5.38 cm. Placenta: Fundal.  Recommendations - Serial growth ultrasounds every 4-6 weeks until delivery - Antenatal testing starting around 32 weeks if she requires medicine to achieve proper glycemic control.   Review of Systems: A review of systems was performed and was negative except per HPI   Vitals and Physical Exam    10/03/2023    1:54 PM 09/22/2023    8:53 AM 08/25/2023    1:22 PM  Vitals with BMI  Weight  204 lbs   Systolic 130 138 848  Diastolic 71 81 77  Pulse 86 98 91    Sitting comfortably on the sonogram table Nonlabored breathing Normal rate and rhythm Abdomen is nontender  Past pregnancies OB History  Gravida Para Term Preterm AB Living  6 3 2 1 2 3   SAB IAB Ectopic Multiple Live Births  2 0 0 0 3    # Outcome Date GA Lbr Len/2nd Weight Sex Type Anes PTL Lv  6 Current           5 Preterm 01/16/22 100w5d  1.321 kg F CS-LTranv Spinal  LIV  4 SAB 01/16/19          3 Term 11/22/17 [redacted]w[redacted]d 12:30 / 05:52 3.099 kg M Vag-Spont EPI  LIV  2 SAB 02/25/09 [redacted]w[redacted]d         1 Term 10/26/07 [redacted]w[redacted]d  2.551 kg M Vag-Vacuum EPI  LIV    Obstetric Comments  Short cervix w/ 2019 pregnancy    I spent 20 minutes reviewing the patients chart, including labs and images as  well as counseling the patient about her medical conditions. Greater than 50% of the time was spent in direct face-to-face patient counseling.  Delora Smaller  MFM, [facility]   10/03/2023  2:59 PM

## 2023-10-03 NOTE — Progress Notes (Signed)
Reorder test strips as initial directions were not clear for ins coverage.

## 2023-10-04 ENCOUNTER — Encounter: Payer: Self-pay | Admitting: Obstetrics and Gynecology

## 2023-10-13 ENCOUNTER — Ambulatory Visit (INDEPENDENT_AMBULATORY_CARE_PROVIDER_SITE_OTHER): Payer: Medicaid Other | Admitting: Obstetrics and Gynecology

## 2023-10-13 ENCOUNTER — Ambulatory Visit: Payer: Medicaid Other | Admitting: Clinical

## 2023-10-13 DIAGNOSIS — Z91199 Patient's noncompliance with other medical treatment and regimen due to unspecified reason: Secondary | ICD-10-CM

## 2023-10-13 NOTE — Progress Notes (Deleted)
   PRENATAL VISIT NOTE  Subjective:  Christine Orozco is a 38 y.o. H3E7876 at 102w2d being seen today for ongoing prenatal care.  She is currently monitored for the following issues for this high-risk pregnancy and has Rh negative state in antepartum period; History of cesarean section; Supervision of high risk pregnancy, antepartum; AMA (advanced maternal age) multigravida 35+, unspecified trimester; Hx preterm delivery at [redacted]w[redacted]d; History of preterm premature rupture of membranes (PPROM); and Gestational diabetes mellitus (GDM) in third trimester on their problem list.  Patient reports {sx:14538}.   .  .   . Denies leaking of fluid.   The following portions of the patient's history were reviewed and updated as appropriate: allergies, current medications, past family history, past medical history, past social history, past surgical history and problem list.   Objective:  There were no vitals filed for this visit.  Fetal Status:           General:  Alert, oriented and cooperative. Patient is in no acute distress.  Skin: Skin is warm and dry. No rash noted.   Cardiovascular: Normal heart rate noted  Respiratory: Normal respiratory effort, no problems with respiration noted  Abdomen: Soft, gravid, appropriate for gestational age.         Assessment and Plan:  Pregnancy: [id number] at [redacted]w[redacted]d 1. Supervision of high risk pregnancy, antepartum (Primary) 2. [redacted] weeks gestation of pregnancy  3. Gestational diabetes mellitus (GDM) in third trimester, gestational diabetes method of control unspecified BG reviewed - was able to pick up supplies on 1/2***? @29 /6 1426g (29%), AC 26%, breech, fundal, 18.2 Next growth scheduled 2/7  4. History of cesarean section G5 breech & PTL at [redacted]w[redacted]d Hx VAVD (G1) & SVD (G3), pelvis proven to 3099g Pt desires ***  5. Request for sterilization Consent signed 09/22/23  6. Hx preterm delivery at [redacted]w[redacted]d 7. History of preterm premature rupture of membranes  (PPROM) Normal CL on anatomy  8. AMA (advanced maternal age) multigravida 35+, unspecified trimester LR NIPS  9. Rh negative state in antepartum period S/p rhogam 12/20  10. Anxiety/depression Mood*** since starting zoloft   {Routine Plan:28585}  {Blank single:19197::Term,Preterm} labor symptoms and general obstetric precautions including but not limited to vaginal bleeding, contractions, leaking of fluid and fetal movement were reviewed in detail with the patient. Please refer to After Visit Summary for other counseling recommendations.   No follow-ups on file.  Future Appointments  Date Time Provider Department Center  10/13/2023 10:45 AM [facility] HEALTH CLINICIAN [facility]  10/13/2023 11:15 AM Erik Kieth BROCKS, MD [facility] None  11/03/2023  8:00 AM Lovely Glenis LABOR, RD [facility] NDM  11/10/2023  3:15 PM [facility] NURSE [facility]  11/10/2023  3:30 PM [facility] US1 [facility]    Kieth BROCKS Erik, MD

## 2023-10-14 NOTE — Progress Notes (Signed)
 Patient rescheduled appointment.

## 2023-10-17 ENCOUNTER — Ambulatory Visit (INDEPENDENT_AMBULATORY_CARE_PROVIDER_SITE_OTHER): Payer: Medicaid Other | Admitting: Family Medicine

## 2023-10-17 VITALS — BP 120/76 | HR 89 | Wt 207.0 lb

## 2023-10-17 DIAGNOSIS — Z3A31 31 weeks gestation of pregnancy: Secondary | ICD-10-CM

## 2023-10-17 DIAGNOSIS — O09523 Supervision of elderly multigravida, third trimester: Secondary | ICD-10-CM | POA: Diagnosis not present

## 2023-10-17 DIAGNOSIS — Z6791 Unspecified blood type, Rh negative: Secondary | ICD-10-CM

## 2023-10-17 DIAGNOSIS — O0990 Supervision of high risk pregnancy, unspecified, unspecified trimester: Secondary | ICD-10-CM

## 2023-10-17 DIAGNOSIS — O24419 Gestational diabetes mellitus in pregnancy, unspecified control: Secondary | ICD-10-CM | POA: Diagnosis not present

## 2023-10-17 DIAGNOSIS — O34219 Maternal care for unspecified type scar from previous cesarean delivery: Secondary | ICD-10-CM

## 2023-10-17 DIAGNOSIS — Z98891 History of uterine scar from previous surgery: Secondary | ICD-10-CM

## 2023-10-17 DIAGNOSIS — O09529 Supervision of elderly multigravida, unspecified trimester: Secondary | ICD-10-CM

## 2023-10-17 DIAGNOSIS — Z8759 Personal history of other complications of pregnancy, childbirth and the puerperium: Secondary | ICD-10-CM

## 2023-10-17 MED ORDER — METFORMIN HCL ER 500 MG PO TB24: 1000.0000 mg | ORAL_TABLET | Freq: Two times a day (BID) | ORAL | 3 refills | Status: DC

## 2023-10-17 NOTE — Progress Notes (Signed)
   PRENATAL VISIT NOTE  Subjective:  Christine Orozco is a 38 y.o. H3E7876 at [redacted]w[redacted]d being seen today for ongoing prenatal care.  She is currently monitored for the following issues for this high-risk pregnancy and has Rh negative state in antepartum period; History of cesarean section; Supervision of high risk pregnancy, antepartum; AMA (advanced maternal age) multigravida 35+, unspecified trimester; Hx preterm delivery at [redacted]w[redacted]d; History of preterm premature rupture of membranes (PPROM); and Gestational diabetes mellitus (GDM) in third trimester on their problem list.  Patient reports no complaints.  Contractions: Not present. Vag. Bleeding: None.  Movement: Present. Denies leaking of fluid.   The following portions of the patient's history were reviewed and updated as appropriate: allergies, current medications, past family history, past medical history, past social history, past surgical history and problem list.   Objective:   Vitals:   10/17/23 1515  BP: 120/76  Pulse: 89  Weight: 207 lb (93.9 kg)    Fetal Status: Fetal Heart Rate (bpm): 150   Movement: Present     General:  Alert, oriented and cooperative. Patient is in no acute distress.  Skin: Skin is warm and dry. No rash noted.   Cardiovascular: Normal heart rate noted  Respiratory: Normal respiratory effort, no problems with respiration noted  Abdomen: Soft, gravid, appropriate for gestational age.  Pain/Pressure: Present     Pelvic: Cervical exam deferred        Extremities: Normal range of motion.  Edema: Trace  Mental Status: Normal mood and affect. Normal behavior. Normal judgment and thought content.   Assessment and Plan:  Pregnancy: [id number] at [redacted]w[redacted]d 1. Supervision of high risk pregnancy, antepartum (Primary) FHR and BP appropriate today - US  MFM FETAL BPP WO NON STRESS; Future  2. Rh negative state in antepartum period Patient will need RhoGAM eval postdelivery  3. History of preterm premature rupture of  membranes (PPROM) No signs of premature delivery today  4. History of cesarean section Patient adamant that she desires a repeat cesarean section.  5. AMA (advanced maternal age) multigravida 35+, unspecified trimester  6. Gestational diabetes mellitus (GDM) in third trimester, gestational diabetes method of control unspecified Patient did not bring blood sugar log today but reports that her fasting blood sugars are between 95 and 105 and her postprandials are all around 150.  Will start metformin  and she will bring blood glucose log to visit in 1 week.  Has appointment with MFM on 2/7. - US  MFM FETAL BPP WO NON STRESS; Future  7. [redacted] weeks gestation of pregnancy   Preterm labor symptoms and general obstetric precautions including but not limited to vaginal bleeding, contractions, leaking of fluid and fetal movement were reviewed in detail with the patient. Please refer to After Visit Summary for other counseling recommendations.   No follow-ups on file.  Future Appointments  Date Time Provider Department Center  11/03/2023  8:00 AM Lovely Glenis LABOR, RD [facility] NDM  11/10/2023  3:15 PM [facility] NURSE [facility]  11/10/2023  3:30 PM [facility] US1 [facility]    Norleen LULLA Rover, MD

## 2023-10-19 ENCOUNTER — Encounter (HOSPITAL_COMMUNITY)
Admission: AD | Disposition: A | Discharge status: Home / Self Care | Payer: Self-pay | Source: Home / Self Care | Attending: Obstetrics and Gynecology

## 2023-10-19 ENCOUNTER — Inpatient Hospital Stay (HOSPITAL_COMMUNITY)
Admission: AD | Admit: 2023-10-19 | Discharge: 2023-10-22 | DRG: 784 | Disposition: A | Discharge status: Home / Self Care | Payer: Medicaid Other | Attending: Obstetrics and Gynecology | Admitting: Obstetrics and Gynecology

## 2023-10-19 ENCOUNTER — Other Ambulatory Visit: Payer: Self-pay

## 2023-10-19 ENCOUNTER — Encounter (HOSPITAL_COMMUNITY): Payer: Self-pay | Admitting: Family Medicine

## 2023-10-19 ENCOUNTER — Inpatient Hospital Stay (HOSPITAL_COMMUNITY): Payer: Medicaid Other

## 2023-10-19 DIAGNOSIS — O09529 Supervision of elderly multigravida, unspecified trimester: Secondary | ICD-10-CM

## 2023-10-19 DIAGNOSIS — O24419 Gestational diabetes mellitus in pregnancy, unspecified control: Secondary | ICD-10-CM | POA: Diagnosis present

## 2023-10-19 DIAGNOSIS — O321XX0 Maternal care for breech presentation, not applicable or unspecified: Secondary | ICD-10-CM | POA: Diagnosis present

## 2023-10-19 DIAGNOSIS — Z3A32 32 weeks gestation of pregnancy: Secondary | ICD-10-CM

## 2023-10-19 DIAGNOSIS — O24420 Gestational diabetes mellitus in childbirth, diet controlled: Secondary | ICD-10-CM | POA: Diagnosis not present

## 2023-10-19 DIAGNOSIS — Z833 Family history of diabetes mellitus: Secondary | ICD-10-CM

## 2023-10-19 DIAGNOSIS — Z888 Allergy status to other drugs, medicaments and biological substances status: Secondary | ICD-10-CM | POA: Diagnosis not present

## 2023-10-19 DIAGNOSIS — O4693 Antepartum hemorrhage, unspecified, third trimester: Principal | ICD-10-CM

## 2023-10-19 DIAGNOSIS — O26893 Other specified pregnancy related conditions, third trimester: Secondary | ICD-10-CM | POA: Diagnosis present

## 2023-10-19 DIAGNOSIS — O4593 Premature separation of placenta, unspecified, third trimester: Secondary | ICD-10-CM | POA: Diagnosis not present

## 2023-10-19 DIAGNOSIS — O26899 Other specified pregnancy related conditions, unspecified trimester: Secondary | ICD-10-CM

## 2023-10-19 DIAGNOSIS — Z302 Encounter for sterilization: Secondary | ICD-10-CM | POA: Diagnosis not present

## 2023-10-19 DIAGNOSIS — O34211 Maternal care for low transverse scar from previous cesarean delivery: Secondary | ICD-10-CM | POA: Diagnosis present

## 2023-10-19 DIAGNOSIS — R58 Hemorrhage, not elsewhere classified: Secondary | ICD-10-CM | POA: Diagnosis present

## 2023-10-19 DIAGNOSIS — Z6791 Unspecified blood type, Rh negative: Secondary | ICD-10-CM | POA: Diagnosis not present

## 2023-10-19 DIAGNOSIS — O4590 Premature separation of placenta, unspecified, unspecified trimester: Secondary | ICD-10-CM | POA: Diagnosis present

## 2023-10-19 DIAGNOSIS — Z880 Allergy status to penicillin: Secondary | ICD-10-CM | POA: Diagnosis not present

## 2023-10-19 DIAGNOSIS — O09899 Supervision of other high risk pregnancies, unspecified trimester: Secondary | ICD-10-CM

## 2023-10-19 DIAGNOSIS — O0990 Supervision of high risk pregnancy, unspecified, unspecified trimester: Secondary | ICD-10-CM

## 2023-10-19 DIAGNOSIS — Z23 Encounter for immunization: Secondary | ICD-10-CM

## 2023-10-19 DIAGNOSIS — R030 Elevated blood-pressure reading, without diagnosis of hypertension: Secondary | ICD-10-CM

## 2023-10-19 DIAGNOSIS — Z8759 Personal history of other complications of pregnancy, childbirth and the puerperium: Secondary | ICD-10-CM

## 2023-10-19 DIAGNOSIS — Z87891 Personal history of nicotine dependence: Secondary | ICD-10-CM | POA: Diagnosis not present

## 2023-10-19 DIAGNOSIS — O368130 Decreased fetal movements, third trimester, not applicable or unspecified: Secondary | ICD-10-CM | POA: Diagnosis present

## 2023-10-19 DIAGNOSIS — O133 Gestational [pregnancy-induced] hypertension without significant proteinuria, third trimester: Secondary | ICD-10-CM | POA: Diagnosis present

## 2023-10-19 DIAGNOSIS — Z98891 History of uterine scar from previous surgery: Secondary | ICD-10-CM

## 2023-10-19 DIAGNOSIS — O09523 Supervision of elderly multigravida, third trimester: Secondary | ICD-10-CM | POA: Diagnosis not present

## 2023-10-19 LAB — CBC
HCT: 28.2 % — ABNORMAL LOW (ref 36.0–46.0)
Hemoglobin: 9.2 g/dL — ABNORMAL LOW (ref 12.0–15.0)
MCH: 27.1 pg (ref 26.0–34.0)
MCHC: 32.6 g/dL (ref 30.0–36.0)
MCV: 82.9 fL (ref 80.0–100.0)
Platelets: 275 10*3/uL (ref 150–400)
RBC: 3.4 MIL/uL — ABNORMAL LOW (ref 3.87–5.11)
RDW: 15.5 % (ref 11.5–15.5)
WBC: 13.6 10*3/uL — ABNORMAL HIGH (ref 4.0–10.5)
nRBC: 0 % (ref 0.0–0.2)

## 2023-10-19 LAB — COMPREHENSIVE METABOLIC PANEL
ALT: 15 U/L (ref 0–44)
AST: 13 U/L — ABNORMAL LOW (ref 15–41)
Albumin: 2.3 g/dL — ABNORMAL LOW (ref 3.5–5.0)
Alkaline Phosphatase: 126 U/L (ref 38–126)
Anion gap: 8 (ref 5–15)
BUN: 8 mg/dL (ref 6–20)
CO2: 17 mmol/L — ABNORMAL LOW (ref 22–32)
Calcium: 8.2 mg/dL — ABNORMAL LOW (ref 8.9–10.3)
Chloride: 109 mmol/L (ref 98–111)
Creatinine, Ser: 0.6 mg/dL (ref 0.44–1.00)
GFR, Estimated: >60 mL/min (ref >60)
Glucose, Bld: 95 mg/dL (ref 70–99)
Potassium: 3.8 mmol/L (ref 3.5–5.1)
Sodium: 134 mmol/L — ABNORMAL LOW (ref 135–145)
Total Bilirubin: 0.5 mg/dL (ref 0.0–1.2)
Total Protein: 5.5 g/dL — ABNORMAL LOW (ref 6.5–8.1)

## 2023-10-19 LAB — POCT I-STAT EG7
Acid-base deficit: 8 mmol/L — ABNORMAL HIGH (ref 0.0–2.0)
Bicarbonate: 17.1 mmol/L — ABNORMAL LOW (ref 20.0–28.0)
Calcium, Ion: 1.18 mmol/L (ref 1.15–1.40)
HCT: 26 % — ABNORMAL LOW (ref 36.0–46.0)
Hemoglobin: 8.8 g/dL — ABNORMAL LOW (ref 12.0–15.0)
O2 Saturation: 75 %
Potassium: 4 mmol/L (ref 3.5–5.1)
Sodium: 136 mmol/L (ref 135–145)
TCO2: 18 mmol/L — ABNORMAL LOW (ref 22–32)
pCO2, Ven: 33.4 mm[Hg] — ABNORMAL LOW (ref 44–60)
pH, Ven: 7.316 (ref 7.25–7.43)
pO2, Ven: 43 mm[Hg] (ref 32–45)

## 2023-10-19 LAB — PROTEIN / CREATININE RATIO, URINE
Creatinine, Urine: 230 mg/dL
Protein Creatinine Ratio: 0.32 mg/mg{creat} — ABNORMAL HIGH (ref 0.00–0.15)
Total Protein, Urine: 74 mg/dL

## 2023-10-19 LAB — KLEIHAUER-BETKE STAIN
# Vials RhIg: 1
Fetal Cells %: 0 %
Quantitation Fetal Hemoglobin: 0 mL

## 2023-10-19 LAB — PROTIME-INR
INR: 1.1 (ref 0.8–1.2)
Prothrombin Time: 14 s (ref 11.4–15.2)

## 2023-10-19 LAB — WET PREP, GENITAL
Clue Cells Wet Prep HPF POC: NONE SEEN
Sperm: NONE SEEN
Trich, Wet Prep: NONE SEEN
WBC, Wet Prep HPF POC: <10 (ref <10)
Yeast Wet Prep HPF POC: NONE SEEN

## 2023-10-19 LAB — URINALYSIS, ROUTINE W REFLEX MICROSCOPIC
Bilirubin Urine: NEGATIVE
Glucose, UA: NEGATIVE mg/dL
Ketones, ur: NEGATIVE mg/dL
Leukocytes,Ua: NEGATIVE
Nitrite: NEGATIVE
Protein, ur: 100 mg/dL — AB
RBC / HPF: >50 RBC/hpf (ref 0–5)
Specific Gravity, Urine: 1.027 (ref 1.005–1.030)
pH: 5 (ref 5.0–8.0)

## 2023-10-19 LAB — APTT: aPTT: 26 s (ref 24–36)

## 2023-10-19 LAB — GLUCOSE, CAPILLARY
Glucose-Capillary: 91 mg/dL (ref 70–99)
Glucose-Capillary: 84 mg/dL (ref 70–99)

## 2023-10-19 LAB — HIV ANTIBODY (ROUTINE TESTING W REFLEX): HIV Screen 4th Generation wRfx: NONREACTIVE

## 2023-10-19 LAB — FIBRINOGEN: Fibrinogen: 601 mg/dL — ABNORMAL HIGH (ref 210–475)

## 2023-10-19 LAB — PREPARE RBC (CROSSMATCH)

## 2023-10-19 LAB — POCT FERN TEST: POCT Fern Test: NEGATIVE

## 2023-10-19 SURGERY — Surgical Case
Anesthesia: Spinal

## 2023-10-19 MED ORDER — STERILE WATER FOR IRRIGATION IR SOLN
Status: DC | PRN
  Administered 2023-10-19: 1

## 2023-10-19 MED ORDER — NALOXONE HCL 4 MG/10ML IJ SOLN: 1.0000 ug/kg/h | INTRAVENOUS | Status: DC | PRN

## 2023-10-19 MED ORDER — DIPHENHYDRAMINE HCL 50 MG/ML IJ SOLN: 12.5000 mg | INTRAMUSCULAR | Status: DC | PRN

## 2023-10-19 MED ORDER — MISOPROSTOL 200 MCG PO TABS
ORAL_TABLET | ORAL | Status: AC
  Filled 2023-10-19: qty 1

## 2023-10-19 MED ORDER — ACETAMINOPHEN 10 MG/ML IV SOLN
INTRAVENOUS | Status: DC | PRN
  Administered 2023-10-19: 1000 mg via INTRAVENOUS

## 2023-10-19 MED ORDER — METHYLERGONOVINE MALEATE 0.2 MG PO TABS
0.2000 mg | ORAL_TABLET | Freq: Four times a day (QID) | ORAL | Status: AC
  Administered 2023-10-19 – 2023-10-20 (×3): 0.2 mg via ORAL
  Filled 2023-10-19 (×3): qty 1

## 2023-10-19 MED ORDER — ZOLPIDEM TARTRATE 5 MG PO TABS
5.0000 mg | ORAL_TABLET | Freq: Every evening | ORAL | Status: DC | PRN
  Administered 2023-10-22: 5 mg via ORAL
  Filled 2023-10-19: qty 1

## 2023-10-19 MED ORDER — SENNOSIDES-DOCUSATE SODIUM 8.6-50 MG PO TABS
2.0000 | ORAL_TABLET | Freq: Every day | ORAL | Status: DC
  Filled 2023-10-19: qty 2

## 2023-10-19 MED ORDER — WITCH HAZEL-GLYCERIN EX PADS: 1.0000 | MEDICATED_PAD | CUTANEOUS | Status: DC | PRN

## 2023-10-19 MED ORDER — DIPHENOXYLATE-ATROPINE 2.5-0.025 MG PO TABS
ORAL_TABLET | ORAL | Status: AC
  Filled 2023-10-19: qty 2

## 2023-10-19 MED ORDER — MORPHINE SULFATE (PF) 0.5 MG/ML IJ SOLN
INTRAMUSCULAR | Status: AC
  Filled 2023-10-19: qty 10

## 2023-10-19 MED ORDER — ENOXAPARIN SODIUM 40 MG/0.4ML IJ SOSY
40.0000 mg | PREFILLED_SYRINGE | INTRAMUSCULAR | Status: DC
  Administered 2023-10-20 – 2023-10-22 (×3): 40 mg via SUBCUTANEOUS
  Filled 2023-10-19 (×3): qty 0.4

## 2023-10-19 MED ORDER — FERROUS SULFATE 325 (65 FE) MG PO TABS
325.0000 mg | ORAL_TABLET | ORAL | Status: DC
  Administered 2023-10-20 – 2023-10-22 (×2): 325 mg via ORAL
  Filled 2023-10-19 (×2): qty 1

## 2023-10-19 MED ORDER — ONDANSETRON HCL 4 MG/2ML IJ SOLN
INTRAMUSCULAR | Status: DC | PRN
  Administered 2023-10-19: 4 mg via INTRAVENOUS

## 2023-10-19 MED ORDER — TRANEXAMIC ACID-NACL 1000-0.7 MG/100ML-% IV SOLN
1000.0000 mg | Freq: Once | INTRAVENOUS | Status: AC
  Administered 2023-10-19 (×2): 1000 mg via INTRAVENOUS

## 2023-10-19 MED ORDER — GABAPENTIN 100 MG PO CAPS
100.0000 mg | ORAL_CAPSULE | Freq: Three times a day (TID) | ORAL | Status: DC
  Administered 2023-10-19 – 2023-10-22 (×8): 100 mg via ORAL
  Filled 2023-10-19 (×8): qty 1

## 2023-10-19 MED ORDER — MISOPROSTOL 25 MCG QUARTER TABLET
ORAL_TABLET | ORAL | Status: DC | PRN
  Administered 2023-10-19: 800 ug via BUCCAL

## 2023-10-19 MED ORDER — FENTANYL CITRATE (PF) 100 MCG/2ML IJ SOLN
INTRAMUSCULAR | Status: DC | PRN
  Administered 2023-10-19: 15 ug via INTRATHECAL

## 2023-10-19 MED ORDER — OXYTOCIN-SODIUM CHLORIDE 30-0.9 UT/500ML-% IV SOLN
INTRAVENOUS | Status: DC | PRN
  Administered 2023-10-19: 500 mL via INTRAVENOUS

## 2023-10-19 MED ORDER — SIMETHICONE 80 MG PO CHEW: 80.0000 mg | CHEWABLE_TABLET | ORAL | Status: DC | PRN

## 2023-10-19 MED ORDER — MISOPROSTOL 200 MCG PO TABS
ORAL_TABLET | ORAL | Status: AC
  Filled 2023-10-19: qty 4

## 2023-10-19 MED ORDER — KETOROLAC TROMETHAMINE 30 MG/ML IJ SOLN
30.0000 mg | Freq: Four times a day (QID) | INTRAMUSCULAR | Status: AC
  Administered 2023-10-19 – 2023-10-20 (×3): 30 mg via INTRAVENOUS
  Filled 2023-10-19 (×3): qty 1

## 2023-10-19 MED ORDER — SCOPOLAMINE 1 MG/3DAYS TD PT72
1.0000 | MEDICATED_PATCH | Freq: Once | TRANSDERMAL | Status: DC
  Administered 2023-10-19: 1.5 mg via TRANSDERMAL
  Filled 2023-10-19: qty 1

## 2023-10-19 MED ORDER — ONDANSETRON HCL 4 MG/2ML IJ SOLN: 4.0000 mg | Freq: Once | INTRAMUSCULAR | Status: DC | PRN

## 2023-10-19 MED ORDER — DIPHENHYDRAMINE HCL 25 MG PO CAPS: 25.0000 mg | ORAL_CAPSULE | ORAL | Status: DC | PRN

## 2023-10-19 MED ORDER — COCONUT OIL OIL
1.0000 | TOPICAL_OIL | Status: DC | PRN
  Administered 2023-10-21 (×2): 1 via TOPICAL

## 2023-10-19 MED ORDER — CARBOPROST TROMETHAMINE 250 MCG/ML IM SOLN
INTRAMUSCULAR | Status: DC | PRN
  Administered 2023-10-19: 250 ug

## 2023-10-19 MED ORDER — ACETAMINOPHEN 500 MG PO TABS: 1000.0000 mg | ORAL_TABLET | Freq: Four times a day (QID) | ORAL | Status: DC

## 2023-10-19 MED ORDER — SERTRALINE HCL 50 MG PO TABS
50.0000 mg | ORAL_TABLET | Freq: Every day | ORAL | Status: DC
  Administered 2023-10-20 – 2023-10-22 (×3): 50 mg via ORAL
  Filled 2023-10-19 (×4): qty 1

## 2023-10-19 MED ORDER — SODIUM CHLORIDE 0.9% FLUSH
3.0000 mL | INTRAVENOUS | Status: DC | PRN
  Administered 2023-10-19: 3 mL via INTRAVENOUS

## 2023-10-19 MED ORDER — METHYLERGONOVINE MALEATE 0.2 MG/ML IJ SOLN
INTRAMUSCULAR | Status: DC | PRN
  Administered 2023-10-19: .2 mg via INTRAMUSCULAR

## 2023-10-19 MED ORDER — SOD CITRATE-CITRIC ACID 500-334 MG/5ML PO SOLN: 30.0000 mL | ORAL | Status: DC

## 2023-10-19 MED ORDER — IBUPROFEN 800 MG PO TABS
800.0000 mg | ORAL_TABLET | Freq: Three times a day (TID) | ORAL | Status: DC
  Administered 2023-10-20 – 2023-10-22 (×6): 800 mg via ORAL
  Filled 2023-10-19 (×6): qty 1

## 2023-10-19 MED ORDER — OXYCODONE HCL 5 MG/5ML PO SOLN: 5.0000 mg | Freq: Once | ORAL | Status: DC | PRN

## 2023-10-19 MED ORDER — ACETAMINOPHEN 500 MG PO TABS
1000.0000 mg | ORAL_TABLET | Freq: Three times a day (TID) | ORAL | Status: DC
  Administered 2023-10-19 – 2023-10-22 (×8): 1000 mg via ORAL
  Filled 2023-10-19 (×8): qty 2

## 2023-10-19 MED ORDER — ACETAMINOPHEN 10 MG/ML IV SOLN
INTRAVENOUS | Status: AC
  Filled 2023-10-19: qty 100

## 2023-10-19 MED ORDER — LACTATED RINGERS IV SOLN: INTRAVENOUS | Status: DC

## 2023-10-19 MED ORDER — MISOPROSTOL 25 MCG QUARTER TABLET
ORAL_TABLET | ORAL | Status: DC | PRN
  Administered 2023-10-19: 800 ug via RECTAL

## 2023-10-19 MED ORDER — OXYTOCIN-SODIUM CHLORIDE 30-0.9 UT/500ML-% IV SOLN
INTRAVENOUS | Status: AC
  Filled 2023-10-19: qty 500

## 2023-10-19 MED ORDER — SODIUM CHLORIDE 0.9 % IV SOLN
500.0000 mg | INTRAVENOUS | Status: DC
  Filled 2023-10-19: qty 5

## 2023-10-19 MED ORDER — NALOXONE HCL 0.4 MG/ML IJ SOLN: 0.4000 mg | INTRAMUSCULAR | Status: DC | PRN

## 2023-10-19 MED ORDER — LACTATED RINGERS IV SOLN: INTRAVENOUS | Status: DC | PRN

## 2023-10-19 MED ORDER — MEDROXYPROGESTERONE ACETATE 150 MG/ML IM SUSP: 150.0000 mg | INTRAMUSCULAR | Status: DC | PRN

## 2023-10-19 MED ORDER — TRANEXAMIC ACID-NACL 1000-0.7 MG/100ML-% IV SOLN
INTRAVENOUS | Status: AC
  Filled 2023-10-19: qty 100

## 2023-10-19 MED ORDER — SODIUM CHLORIDE 0.9% IV SOLUTION: Freq: Once | INTRAVENOUS | Status: DC

## 2023-10-19 MED ORDER — FENTANYL CITRATE (PF) 100 MCG/2ML IJ SOLN
INTRAMUSCULAR | Status: AC
  Filled 2023-10-19: qty 2

## 2023-10-19 MED ORDER — CEFAZOLIN SODIUM-DEXTROSE 2-4 GM/100ML-% IV SOLN
2.0000 g | INTRAVENOUS | Status: AC
  Administered 2023-10-19: 2 g via INTRAVENOUS

## 2023-10-19 MED ORDER — KETOROLAC TROMETHAMINE 30 MG/ML IJ SOLN: 30.0000 mg | Freq: Once | INTRAMUSCULAR | Status: DC | PRN

## 2023-10-19 MED ORDER — DIPHENOXYLATE-ATROPINE 2.5-0.025 MG PO TABS
2.0000 | ORAL_TABLET | Freq: Four times a day (QID) | ORAL | Status: DC
  Administered 2023-10-19 (×3): 2 via ORAL
  Filled 2023-10-19 (×3): qty 2

## 2023-10-19 MED ORDER — DEXAMETHASONE SODIUM PHOSPHATE 10 MG/ML IJ SOLN
INTRAMUSCULAR | Status: DC | PRN
  Administered 2023-10-19: 10 mg via INTRAVENOUS

## 2023-10-19 MED ORDER — DIBUCAINE (PERIANAL) 1 % EX OINT: 1.0000 | TOPICAL_OINTMENT | CUTANEOUS | Status: DC | PRN

## 2023-10-19 MED ORDER — PRENATAL MULTIVITAMIN CH
1.0000 | ORAL_TABLET | Freq: Every day | ORAL | Status: DC
  Administered 2023-10-20 – 2023-10-22 (×3): 1 via ORAL
  Filled 2023-10-19 (×3): qty 1

## 2023-10-19 MED ORDER — SIMETHICONE 80 MG PO CHEW
80.0000 mg | CHEWABLE_TABLET | Freq: Three times a day (TID) | ORAL | Status: DC
  Administered 2023-10-20 – 2023-10-22 (×7): 80 mg via ORAL
  Filled 2023-10-19 (×7): qty 1

## 2023-10-19 MED ORDER — KETOROLAC TROMETHAMINE 30 MG/ML IJ SOLN
30.0000 mg | Freq: Four times a day (QID) | INTRAMUSCULAR | Status: DC | PRN
  Administered 2023-10-19: 30 mg via INTRAVENOUS

## 2023-10-19 MED ORDER — OXYCODONE HCL 5 MG PO TABS
5.0000 mg | ORAL_TABLET | ORAL | Status: DC | PRN
Start: 2023-10-19 — End: 2023-10-22
  Administered 2023-10-19 – 2023-10-22 (×9): 5 mg via ORAL
  Filled 2023-10-19 (×9): qty 1

## 2023-10-19 MED ORDER — DIPHENHYDRAMINE HCL 50 MG/ML IJ SOLN
INTRAMUSCULAR | Status: AC
  Filled 2023-10-19: qty 1

## 2023-10-19 MED ORDER — MEPERIDINE HCL 25 MG/ML IJ SOLN: 6.2500 mg | INTRAMUSCULAR | Status: DC | PRN

## 2023-10-19 MED ORDER — KETOROLAC TROMETHAMINE 30 MG/ML IJ SOLN: 30.0000 mg | Freq: Four times a day (QID) | INTRAMUSCULAR | Status: DC | PRN

## 2023-10-19 MED ORDER — PHENYLEPHRINE HCL-NACL 20-0.9 MG/250ML-% IV SOLN
INTRAVENOUS | Status: DC | PRN
  Administered 2023-10-19: 60 ug/min via INTRAVENOUS

## 2023-10-19 MED ORDER — DIPHENHYDRAMINE HCL 50 MG/ML IJ SOLN
INTRAMUSCULAR | Status: DC | PRN
  Administered 2023-10-19: 12.5 mg via INTRAVENOUS

## 2023-10-19 MED ORDER — KETOROLAC TROMETHAMINE 30 MG/ML IJ SOLN
INTRAMUSCULAR | Status: AC
  Filled 2023-10-19: qty 1

## 2023-10-19 MED ORDER — HYDROMORPHONE HCL 1 MG/ML IJ SOLN: 0.2500 mg | INTRAMUSCULAR | Status: DC | PRN

## 2023-10-19 MED ORDER — METHYLERGONOVINE MALEATE 0.2 MG/ML IJ SOLN
INTRAMUSCULAR | Status: AC
  Filled 2023-10-19: qty 1

## 2023-10-19 MED ORDER — OXYTOCIN-SODIUM CHLORIDE 30-0.9 UT/500ML-% IV SOLN
2.5000 [IU]/h | INTRAVENOUS | Status: AC
  Administered 2023-10-19: 2.5 [IU]/h via INTRAVENOUS

## 2023-10-19 MED ORDER — FENTANYL CITRATE (PF) 100 MCG/2ML IJ SOLN
INTRAMUSCULAR | Status: DC | PRN
  Administered 2023-10-19: 85 ug via INTRAVENOUS

## 2023-10-19 MED ORDER — OXYCODONE HCL 5 MG PO TABS: 5.0000 mg | ORAL_TABLET | Freq: Once | ORAL | Status: DC | PRN

## 2023-10-19 MED ORDER — AMISULPRIDE (ANTIEMETIC) 5 MG/2ML IV SOLN: 10.0000 mg | Freq: Once | INTRAVENOUS | Status: DC | PRN

## 2023-10-19 MED ORDER — ONDANSETRON HCL 4 MG/2ML IJ SOLN: 4.0000 mg | Freq: Three times a day (TID) | INTRAMUSCULAR | Status: DC | PRN

## 2023-10-19 MED ORDER — CARBOPROST TROMETHAMINE 250 MCG/ML IM SOLN
INTRAMUSCULAR | Status: AC
  Filled 2023-10-19: qty 1

## 2023-10-19 MED ORDER — DIPHENHYDRAMINE HCL 25 MG PO CAPS: 25.0000 mg | ORAL_CAPSULE | Freq: Four times a day (QID) | ORAL | Status: DC | PRN

## 2023-10-19 MED ORDER — MORPHINE SULFATE (PF) 0.5 MG/ML IJ SOLN
INTRAMUSCULAR | Status: DC | PRN
  Administered 2023-10-19: 150 ug via INTRATHECAL

## 2023-10-19 MED ORDER — MEPERIDINE HCL 25 MG/ML IJ SOLN
6.2500 mg | INTRAMUSCULAR | Status: DC | PRN
Start: 2023-10-19 — End: 2023-10-19

## 2023-10-19 MED ORDER — MENTHOL 3 MG MT LOZG: 1.0000 | LOZENGE | OROMUCOSAL | Status: DC | PRN

## 2023-10-19 SURGICAL SUPPLY — 27 items
BENZOIN TINCTURE PRP APPL 2/3 (GAUZE/BANDAGES/DRESSINGS) IMPLANT
CHLORAPREP W/TINT 26 (MISCELLANEOUS) ×2 IMPLANT
CLAMP UMBILICAL CORD (MISCELLANEOUS) ×1 IMPLANT
CLOTH BEACON ORANGE TIMEOUT ST (SAFETY) ×1 IMPLANT
DERMABOND ADVANCED .7 DNX12 (GAUZE/BANDAGES/DRESSINGS) ×1 IMPLANT
DRSG OPSITE POSTOP 4X10 (GAUZE/BANDAGES/DRESSINGS) ×1 IMPLANT
ELECT REM PT RETURN 9FT ADLT (ELECTROSURGICAL) ×1
ELECTRODE REM PT RTRN 9FT ADLT (ELECTROSURGICAL) ×1 IMPLANT
EXTRACTOR VACUUM KIWI (MISCELLANEOUS) ×1 IMPLANT
GAUZE SPONGE 4X4 12PLY STRL LF (GAUZE/BANDAGES/DRESSINGS) IMPLANT
GLOVE BIOGEL PI IND STRL 7.0 (GLOVE) ×3 IMPLANT
GLOVE ECLIPSE 6.5 STRL STRAW (GLOVE) ×1 IMPLANT
GOWN STRL REUS W/ TWL LRG LVL3 (GOWN DISPOSABLE) ×2 IMPLANT
NS IRRIG 1000ML POUR BTL (IV SOLUTION) ×1 IMPLANT
PAD OB MATERNITY 4.3X12.25 (PERSONAL CARE ITEMS) ×1 IMPLANT
PAD PREP 24X48 CUFFED NSTRL (MISCELLANEOUS) ×1 IMPLANT
RETRACTOR WND ALEXIS 25 LRG (MISCELLANEOUS) IMPLANT
RTRCTR WOUND ALEXIS 25CM LRG (MISCELLANEOUS)
SUT MNCRL AB 0 CT1 27 (SUTURE) ×1 IMPLANT
SUT PLAIN 2 0 XLH (SUTURE) ×1 IMPLANT
SUT VIC AB 0 CT1 36 (SUTURE) ×1 IMPLANT
SUT VIC AB 0 CTX36XBRD ANBCTRL (SUTURE) ×1 IMPLANT
SUT VIC AB 2-0 CT1 TAPERPNT 27 (SUTURE) ×1 IMPLANT
SUT VIC AB 4-0 KS 27 (SUTURE) ×1 IMPLANT
TOWEL OR 17X24 6PK STRL BLUE (TOWEL DISPOSABLE) ×3 IMPLANT
TRAY FOLEY W/BAG SLVR 16FR ST (SET/KITS/TRAYS/PACK) ×1 IMPLANT
WATER STERILE IRR 1000ML POUR (IV SOLUTION) ×1 IMPLANT

## 2023-10-19 NOTE — Anesthesia Preprocedure Evaluation (Signed)
Anesthesia Evaluation  Patient identified by MRN, date of birth, ID band Patient awake    Reviewed: Allergy & Precautions, NPO status , Patient's Chart, lab work & pertinent test results  Airway Mallampati: II  TM Distance: >3 FB Neck ROM: Full    Dental no notable dental hx. (+) Teeth Intact, Dental Advisory Given   Pulmonary neg pulmonary ROS, former smoker   Pulmonary exam normal breath sounds clear to auscultation       Cardiovascular hypertension, Normal cardiovascular exam Rhythm:Regular Rate:Normal     Neuro/Psych   Anxiety        GI/Hepatic negative GI ROS, Neg liver ROS,,,  Endo/Other  diabetes    Renal/GU negative Renal ROS     Musculoskeletal negative musculoskeletal ROS (+)    Abdominal   Peds  Hematology   Anesthesia Other Findings Hx of preterm del 42 wk  New acute onset vaginal bleeding, u/s with intrauterine clots   Reproductive/Obstetrics (+) Pregnancy                              Anesthesia Physical Anesthesia Plan  ASA: 3  Anesthesia Plan: Spinal   Post-op Pain Management:    Induction:   PONV Risk Score and Plan: Treatment may vary due to age or medical condition and Ondansetron  Airway Management Planned: Natural Airway and Nasal Cannula  Additional Equipment:   Intra-op Plan:   Post-operative Plan:   Informed Consent: I have reviewed the patients History and Physical, chart, labs and discussed the procedure including the risks, benefits and alternatives for the proposed anesthesia with the patient or authorized representative who has indicated his/her understanding and acceptance.     Dental advisory given  Plan Discussed with: CRNA  Anesthesia Plan Comments: (27.5 Wk G5P2 w Breech for C/S)         Anesthesia Quick Evaluation

## 2023-10-19 NOTE — Anesthesia Postprocedure Evaluation (Signed)
Anesthesia Post Note  Patient: Christine Orozco  Procedure(s) Performed: CESAREAN SECTION     Anesthesia Type: Spinal Anesthetic complications: no   No notable events documented.  Last Vitals:  Vitals:   10/19/23 1815 10/19/23 1953  BP: (!) 113/52 (!) 127/56  Pulse: 73 67  Resp: 17 16  Temp:  36.6 C  SpO2: 96% 96%    Last Pain:  Vitals:   10/19/23 1953  TempSrc: Oral  PainSc:                  [place]

## 2023-10-19 NOTE — Lactation Note (Signed)
This note was copied from a baby's chart.  NICU Lactation Consultation Note  Patient Name: Boy Lensey Alvarado [id number] Date: 10/19/2023 Age:38 years  Reason for consult: Preterm <34wks; Initial assessment; NICU baby; Infant < 6lbs; Maternal endocrine disorder; Other (Comment) (AMA) Type of Endocrine Disorder?: Diabetes (GDM)  SUBJECTIVE Spoke to [facility] RN [place] and she confirmed that feeding choice after NICU admission is formula only. Inquired about baby's H&P where breastfeeding is mentioned but she voiced that even though she did breastfeed her last child, she got engorged and also dealt with mastitis and has decided to do donor milk but won't be pumping or putting baby to breast. Revised lactation suppression with RN. LC services are completed at this time.  OBJECTIVE Infant data: Mother's Current Feeding Choice: -- (NPO)  O2 Device: CPAP FiO2 (%): 65 %  Maternal data: [id number] C-Section, Low Transverse Current breast feeding challenges:: NICU admission Previous breastfeeding challenges?: Breast / nipple pain (She had mastitis) Does the patient have breastfeeding experience prior to this delivery?: Yes Risk factor for low/delayed milk supply:: C/S, prematurity, GDM, AMA, infant separation  ASSESSMENT Infant: Feeding Status: NPO  Maternal: No data recorded  INTERVENTIONS/PLAN Plan: Consult Status: Complete   Aoki Wedemeyer S Brody Bonneau 10/19/2023, 5:52 PM

## 2023-10-19 NOTE — Addendum Note (Signed)
Addendum  created 10/19/23 2224 by Artist Beach, RN   Child order released for a procedure order, Clinical Note Signed, Intraprocedure Blocks edited, Order Canceled from Note, SmartForm saved

## 2023-10-19 NOTE — H&P (Signed)
[facility] ANTEPARTUM ADMISSION HISTORY AND PHYSICAL NOTE   History of Present Illness: Christine Orozco is a 38 y.o. [id number] at [redacted]w[redacted]d admitted for vaginal bleeding.  Vaginal bleeding started at about 1030 this morning. She was standing in the kitchen when she had a large gush of bright red blood. States it was enough to fill up a diaper. Denies abdominal pain or recent fall/trauma. Decreased fetal movement this morning.   Reports history of hypertension in previous pregnancy. Denies hypertension outside of pregnancy. Denies headache, visual disturbance, or epigastric pain.   Patient reports the fetal movement as decreased . Patient reports uterine contraction  activity as none. Patient reports  vaginal bleeding as heavier than a normal period Patient describes fluid per vagina as None. Fetal presentation is breech.  Patient Active Problem List   Diagnosis Date Noted   Gestational diabetes mellitus (GDM) in third trimester 09/25/2023   AMA (advanced maternal age) multigravida 35+, unspecified trimester 08/15/2023   Hx preterm delivery at [redacted]w[redacted]d 08/15/2023   History of preterm premature rupture of membranes (PPROM) 08/15/2023   Supervision of high risk pregnancy, antepartum 08/10/2023   History of cesarean section 03/04/2022   Rh negative state in antepartum period 06/07/2017    Past Medical History:  Diagnosis Date   Anxiety    Hepatitis B antibody positive    Miscarriage    Preterm premature rupture of membranes (PPROM) in second trimester, antepartum 01/06/2022   Rh negative status during pregnancy 06/07/2017   Will need rhogam   Vaginal Pap smear, abnormal     Past Surgical History:  Procedure Laterality Date   CERVICAL BIOPSY  W/ LOOP ELECTRODE EXCISION     CESAREAN SECTION N/A 01/16/2022   Procedure: CESAREAN SECTION;  Surgeon: [place] Bing, MD;  Location: [facility] LD ORS;  Service: Obstetrics;  Laterality: N/A;   WISDOM TOOTH EXTRACTION      OB History  Gravida Para  Term Preterm AB Living  6 3 2 1 2 3   SAB IAB Ectopic Multiple Live Births  2 0 0 0 3    # Outcome Date GA Lbr Len/2nd Weight Sex Type Anes PTL Lv  6 Current           5 Preterm 01/16/22 [redacted]w[redacted]d  1321 g F CS-LTranv Spinal  LIV  4 SAB 01/16/19          3 Term 11/22/17 [redacted]w[redacted]d 12:30 / 05:52 3099 g M Vag-Spont EPI  LIV  2 SAB 02/25/09 [redacted]w[redacted]d         1 Term 10/26/07 [redacted]w[redacted]d  2551 g Judie Petit Vag-Vacuum EPI  LIV    Obstetric Comments  Short cervix w/ 2019 pregnancy    Social History   Socioeconomic History   Marital status: Married    Spouse name: Materials engineer   Number of children: 2   Years of education: Not on file   Highest education level: Not on file  Occupational History   Not on file  Tobacco Use   Smoking status: Former    Current packs/day: 0.00    Types: Cigarettes    Quit date: 10/2021    Years since quitting: 2.0   Smokeless tobacco: Never   Tobacco comments:    States she quit in 2020  Vaping Use   Vaping status: Some Days   Last attempt to quit: 10/03/2021   Substances: Nicotine  Substance and Sexual Activity   Alcohol use: Not Currently    Comment: not while preg   Drug use: Not  Currently    Types: Marijuana    Comment: last used three months ago as of 01/06/2022   Sexual activity: Yes    Partners: Male    Birth control/protection: None  Other Topics Concern   Not on file  Social History Narrative   Not on file   Social Drivers of Health   Financial Resource Strain: Not on File (02/26/2022)   [facility] [employer] [facility] [employer] [facility]   [facility] [employer]    Financial Resource Strain: 0  Food Insecurity: Not on File (02/26/2022)   Received from [place], [facility] Insecurity    Food: 0  Transportation Needs: Not on File (02/26/2022)   Received from [employer] Needs    Transportation: 0  Recent Concern: Transportation Needs - At Risk (02/26/2022)   Received from [place], [employer] Needs    Transportation: 2  Physical Activity: Not on File  (02/26/2022)   Received from [place], [place]   Physical Activity    Physical Activity: 0  Stress: Not on File (02/26/2022)   Received from [facility], [place]   Stress    Stress: 0  Social Connections: Not on File (06/12/2023)   Received from [employer]   Social Connections    Connectedness: 0    Family History  Adopted: Yes  Problem Relation Age of Onset   Hepatitis B Mother    Diabetes Sister     Allergies  Allergen Reactions   Beef-Derived Drug Products Nausea And Vomiting and Other (See Comments)    Actively vomits. Alpha-gal allergy.     Ultram [Tramadol] Other (See Comments)    Hallucinate and heart stop   Augmentin [Amoxicillin-Pot Clavulanate] Other (See Comments)    Childhood, pt states she can take penicillin Has patient had a PCN reaction causing immediate rash, facial/tongue/throat swelling, SOB or lightheadedness with hypotension: No Has patient had a PCN reaction causing severe rash involving mucus membranes or skin necrosis: No Has patient had a PCN reaction that required hospitalization No Has patient had a PCN reaction occurring within the last 10 years: No If all of the above answers are "NO", then may proceed with Cephalosporin use.    Sudafed [Pseudoephedrine] Palpitations and Other (See Comments)    hyperactivity     Medications Prior to Admission  Medication Sig Dispense Refill Last Dose/Taking   Accu-Chek Softclix Lancets lancets 1 each by Other route 4 (four) times daily. Use as instructed 100 each 12    Blood Glucose Monitoring Suppl (ACCU-CHEK GUIDE) w/Device KIT 1 kit by Does not apply route 4 (four) times daily. 1 kit 0    Blood Pressure Monitoring (BLOOD PRESSURE KIT) DEVI 1 Device by Does not apply route once a week. (Patient not taking: Reported on 08/23/2023) 1 each 0    cetirizine (ZYRTEC) 10 MG tablet Take 10 mg by mouth daily. (Patient not taking: Reported on 08/23/2023)      ferrous sulfate 325 (65 FE) MG EC tablet Take 1 tablet (325 mg total) by mouth  every other day. 30 tablet 3    glucose blood (ACCU-CHEK GUIDE TEST) test strip Use 1 test strip to check blood sugar 4 times daily 100 each 12    metFORMIN (GLUCOPHAGE-XR) 500 MG 24 hr tablet Take 2 tablets (1,000 mg total) by mouth 2 (two) times daily with a meal. 120 tablet 3    Prenatal Vit-Fe Fumarate-FA (PRENATAL VITAMIN PO) Take 1 tablet by mouth daily.      sertraline (ZOLOFT) 50 MG  tablet Take 1 tablet (50 mg total) by mouth daily. 30 tablet 1     Review of Systems - Negative except vaginal bleeding  Vitals:  BP (!) 148/83 (BP Location: Right Arm)   Pulse 86   Resp 18   SpO2 100%  Physical Examination: CONSTITUTIONAL: Well-developed, well-nourished female in no acute distress.  HENT:  Normocephalic, atraumatic, External right and left ear normal. Oropharynx is clear and moist EYES: Conjunctivae and EOM are normal. Pupils are equal, round, and reactive to light. No scleral icterus.  NECK: Normal range of motion, supple, no masses SKIN: Skin is warm and dry. No rash noted. Not diaphoretic. No erythema. No pallor. NEUROLGIC: Alert and oriented to person, place, and time. Normal reflexes, muscle tone coordination. No cranial nerve deficit noted. PSYCHIATRIC: Normal mood and affect. Normal behavior. Normal judgment and thought content. CARDIOVASCULAR: Normal heart rate noted, regular rhythm RESPIRATORY: Effort and breath sounds normal, no problems with respiration noted ABDOMEN: Soft, nontender, nondistended, gravid. MUSCULOSKELETAL: Normal range of motion. No edema and no tenderness. 2+ distal pulses. PELVIC: Large amount of watery blood. Unable to visualize cervix due to vaginal wall prolapse.   Cervix: Dilation: Closed Effacement (%): Thick Cervical Position: Posterior Station: -3 Presentation: Vertex Exam by:: Estanislado Spire NP Membranes:intact Fetal Monitoring:Baseline: 135 bpm, Variability: Good {> 6 bpm), Accelerations: Reactive, and Decelerations: Absent Tocometer:  Flat  Labs:  Results for orders placed or performed during the hospital encounter of 10/19/23 (from the past 24 hours)  Wet prep, genital   Collection Time: 10/19/23 11:08 AM  Result Value Ref Range   Yeast Wet Prep HPF POC NONE SEEN NONE SEEN   Trich, Wet Prep NONE SEEN NONE SEEN   Clue Cells Wet Prep HPF POC NONE SEEN NONE SEEN   WBC, Wet Prep HPF POC <10 <10   Sperm NONE SEEN   Fern Test   Collection Time: 10/19/23 11:21 AM  Result Value Ref Range   POCT Fern Test Negative = intact amniotic membranes     Imaging Studies: No results found.   Assessment and Plan: 1. Vaginal bleeding in pregnancy, third trimester  -Moderate amount of watery blood noted on exam. Cat 1 fetal tracing. Abdomen soft & non tender, toco quiet.  -Coags & KB pending -Ultrasound shows blood clots in amniotic fluid. Continues to have moderate bleeding. Dr. Vergie Living at bedside to discussed delivery.   2. Gestational diabetes mellitus (GDM) in third trimester, gestational diabetes method of control unspecified  -Currently no meds. Did not bring BS log to most recent OB visit.  -CBG in [facility]U   3. History of cesarean section  -Desires RCS with BTL  4. Rh negative state in antepartum period  -Received rhogam 12/20 -KB ordered  5. Elevated BP without diagnosis of hypertension  -MRBP in triage. Asymptomatic. Preeclampsia labs ordered    Judeth Horn, NP 10/19/2023 11:34 AM

## 2023-10-19 NOTE — MAU Note (Addendum)
SALEEMAH DEVENNEY is a 38 y.o. at [redacted]w[redacted]d here in MAU reporting: arrived via ems with reports of vaginal bleeding that started at 1015 while she was standing and making coffee. Denies any pain currently and denies any cramping/contracting. Denies any previous episodes of bleeding or LOF. Reports feeling no fetal movement this am. States baby is more active in the evenings. Hx of PPROM at 26 weeks. Del at 27&5.   Estanislado Spire NP at bedside at 1055.  Pelvic exam at 1100.  External os 1cm internal os closed    NPO status-   Twix bar at 0630 no fluids this am.   LMP: n/a Onset of complaint: 1015 Pain score: 0 Vitals:   10/19/23 1100  BP: (!) 148/83  Pulse: 86  Resp: 18  SpO2: 100%     FHT:126 Lab orders placed from triage:

## 2023-10-19 NOTE — Op Note (Signed)
Christine Orozco PROCEDURE DATE: 10/19/2023  PREOPERATIVE DIAGNOSES: Intrauterine pregnancy at [redacted]w[redacted]d weeks gestation; abruptio placenta  POSTOPERATIVE DIAGNOSES: The same. PPH/Uterine Atony  PROCEDURE: Repeat Low Transverse Cesarean Section with Bilateral tubal sterilization/Salingectomy  SURGEON:  Federico Flake   ASSISTANT:  Wylene Simmer MD An experienced assistant was required given the standard of surgical care given the complexity of the case.  This assistant was needed for exposure, dissection, suctioning, retraction, instrument exchange,  assisting with delivery with administration of fundal pressure, and for overall help during the procedure.  ANESTHESIOLOGY TEAM: Anesthesiologist: [place] Nation, MD CRNA: Renford Dills, CRNA Student Nurse Anesthetist: Artist Beach, RN  INDICATIONS: Christine Orozco is a 38 y.o. [phone] at [redacted]w[redacted]d here for cesarean section secondary to the indications listed under preoperative diagnoses; please see preoperative note for further details.  The risks of cesarean section were discussed with the patient including but were not limited to: bleeding which may require transfusion or reoperation; infection which may require antibiotics; injury to bowel, bladder, ureters or other surrounding organs; injury to the fetus; need for additional procedures including hysterectomy in the event of a life-threatening hemorrhage; placental abnormalities wth subsequent pregnancies, incisional problems, thromboembolic phenomenon and other postoperative/anesthesia complications.   The patient concurred with the proposed plan, giving informed written consent for the procedure.    FINDINGS:  Viable female infant in breech presentation.   Apgars APGAR (1 MIN):   APGAR (5 MINS):   APGAR (10 MINS):  Port wine amniotic fluid.  Ratty placenta with evidence of about 40% abruption, three vessel cord.  Normal uterus, fallopian tubes and ovaries bilaterally.  ANESTHESIA:  Spinal INTRAVENOUS FLUIDS: 3000 ml   ESTIMATED BLOOD LOSS: 557 ml URINE OUTPUT:  150 ml SPECIMENS: Placenta sent topathology COMPLICATIONS: PPH due to uterine atony. Received multiple medications- Pitocin bolus, TXA 1314&1357, Methergine 1345, Cytotec 800mg  PO 1340 but vomited 13:53 all tablets, Cytotec 800mg  Rectal 14:11, Hemabate 1402.   PROCEDURE IN DETAIL:  The patient preoperatively received intravenous antibiotics and had sequential compression devices applied to her lower extremities.  She was then taken to the operating room where spinal anesthesia was administered was dosed up to surgical level and was found to be adequate. She was then placed in a dorsal supine position with a leftward tilt, and prepped and draped in a sterile manner.  A foley catheter was placed into her bladder and attached to constant gravity.  After an adequate timeout was performed, a Pfannenstiel skin incision was made with scalpel on her preexisting scar and carried through to the underlying layer of fascia. The fascia was incised in the midline, and this incision was extended bilaterally using the Mayo scissors.  Kocher clamps were applied to the superior aspect of the fascial incision and the underlying rectus muscles were dissected off bluntly and sharply.  A similar process was carried out on the inferior aspect of the fascial incision. The rectus muscles were separated in the midline and the peritoneum was entered bluntly. The Alexis self-retaining retractor was introduced into the abdominal cavity.  Attention was turned to the lower uterine segment where a low transverse hysterotomy was made with a scalpel and extended bilaterally bluntly.  The infant was successfully delivered in the typical fashion for breech presentation, the cord was clamped and cut immediately, and the infant was handed over to the awaiting neonatology team. Uterine massage was then administered, and the placenta delivered intact with a  three-vessel cord. The uterus was then cleared of clots  and debris.  The hysterotomy was closed with 0 Monocryl in a running locked fashion.     Attention turned to left fallopian tube was identified and followed out to the fimbriated end.  Ligasure device was used to cauterize and cut the mesosalpinx to proximal end of the fallopian tube, removing ~6cm of tube. A similar process was carried out on the right side allowing for bilateral tubal sterilization.    Good hemostasis was noted overall.   The pelvis was cleared of all clot and debris. Hemostasis was confirmed on all surfaces.  Uterus was atonic and we intervened with multiple medication and manual compression of the uterus.  Once tone improved, the retractor was removed.    The peritoneum was closed with a 0 Vicryl running stitch and the rectus muscles were reapproximated using 0 Vicryl interrupted stitches. The fascia was then closed using 0 Vicryl in a running fashion.  The subcutaneous layer was irrigated, reapproximated with 2-0 plain gut interrupted stitches, and the skin was closed with a 4-0 Vicryl subcuticular stitch. The patient tolerated the procedure well. Sponge, instrument and needle counts were correct x 3.  She was taken to the recovery room in stable condition.   Federico Flake, MD [facility]

## 2023-10-19 NOTE — Anesthesia Procedure Notes (Cosign Needed)
Spinal  Patient location during procedure: OR Start time: 10/19/2023 1:07 PM End time: 10/19/2023 1:11 PM Reason for block: surgical anesthesia Staffing Performed: other anesthesia staff  Anesthesiologist: [place] Nation, MD Resident/CRNA: Renford Dills, CRNA Other anesthesia staff: Artist Beach, RN Performed by: Artist Beach, RN Authorized by: [place] Nation, MD   Preanesthetic Checklist Completed: patient identified, IV checked, site marked, risks and benefits discussed, surgical consent, monitors and equipment checked, pre-op evaluation and timeout performed Spinal Block Patient position: sitting Prep: DuraPrep Patient monitoring: heart rate, cardiac monitor, continuous pulse ox and blood pressure Approach: midline Location: L3-4 Injection technique: single-shot Needle Needle type: Pencan  Needle gauge: 24 G Needle length: 10 cm Assessment Sensory level: T4 Events: CSF return

## 2023-10-19 NOTE — Transfer of Care (Signed)
Immediate Anesthesia Transfer of Care Note  Patient: Christine Orozco  Procedure(s) Performed: CESAREAN SECTION  Patient Location: PACU  Anesthesia Type:Spinal  Level of Consciousness: awake  Airway & Oxygen Therapy: Patient Spontanous Breathing  Post-op Assessment: Report given to RN and Post -op Vital signs reviewed and stable  Post vital signs: Reviewed and stable  Last Vitals:  Vitals Value Taken Time  BP    Temp    Pulse    Resp    SpO2      Last Pain:  Vitals:   10/19/23 1100  TempSrc: Oral         Complications: No notable events documented.

## 2023-10-19 NOTE — Discharge Summary (Signed)
Postpartum Discharge Summary  Date of Service updated-1/19     Patient Name: Christine Orozco DOB: 07-08-1986 MRN: [id number]  Date of admission: 10/19/2023 Delivery date:10/19/2023 Delivering provider: Federico Flake Date of discharge: 10/22/2023  Admitting diagnosis: Antepartum placental abruption [O45.90] Bleeding [R58] Intrauterine pregnancy: [redacted]w[redacted]d     Secondary diagnosis:  Principal Problem:   Status post repeat low transverse cesarean section Active Problems:   Rh negative state in antepartum period   Supervision of high risk pregnancy, antepartum   AMA (advanced maternal age) multigravida 35+, unspecified trimester   Hx preterm delivery at [redacted]w[redacted]d   History of preterm premature rupture of membranes (PPROM)   Gestational diabetes mellitus (GDM) in third trimester   Transient hypertension of pregnancy in third trimester   Antepartum placental abruption   Bleeding  Additional problems: none    Discharge diagnosis: Preterm Pregnancy Delivered and placental abruption                                               Post partum procedures: none Augmentation: N/A Complications: Placental Abruption  Hospital course: Sceduled C/S   38 y.o. yo [id number] at [redacted]w[redacted]d was admitted to the hospital 10/19/2023 for cesarean section with the following indication: placental abruption . Patient presented to [facility] for vaginal bleeding. Found to have clots and continued bleeding on evaluation. Ultrasound with concern for abruption as well. Repeat cesarean with BTL performed. Delivery details are as follows:  Membrane Rupture Time/Date: 1:33 PM,10/19/2023  Delivery Method:C-Section, Low Transverse Operative Delivery:N/A Details of operation can be found in separate operative note.  Patient had a postpartum course was uncomplicated.  She is ambulating, tolerating a regular diet, passing flatus, and urinating well. Patient is discharged home in stable condition on  10/22/23        Newborn  Data: Birth date:10/19/2023 Birth time:1:34 PM Gender:Female Living status:Living Apgars:6 ,9  Weight:1940 g    Magnesium Sulfate received: No BMZ received: No Rhophylac:work up in process MMR:No T-DaP:Given prenatally Flu: Yes RSV Vaccine received: No Transfusion:No  Immunizations received: Immunization History  Administered Date(s) Administered   Influenza, Seasonal, Injecte, Preservative Fre 09/22/2023   Influenza,inj,Quad PF,6+ Mos 06/28/2017   MMR 11/24/2017   Tdap 09/17/2015, 09/13/2017, 01/14/2022, 09/22/2023    Physical exam  Vitals:   10/21/23 1522 10/21/23 1930 10/21/23 2321 10/22/23 0450  BP: 127/70 134/64 125/71 121/70  Pulse: 87 91 84 85  Resp: 17 17 18 18   Temp: 97.9 F (36.6 C) 98 F (36.7 C) 98.6 F (37 C) 98.6 F (37 C)  TempSrc: Oral Oral Oral Oral  SpO2: 99% 100% 100% 100%   General: alert, cooperative, and no distress Lochia: appropriate Uterine Fundus: firm Incision: Dressing is clean, dry, and intact DVT Evaluation: No evidence of DVT seen on physical exam. Labs: Lab Results  Component Value Date   WBC 13.8 (H) 10/20/2023   HGB 8.2 (L) 10/20/2023   HCT 25.5 (L) 10/20/2023   MCV 83.1 10/20/2023   PLT 241 10/20/2023      Latest Ref Rng & Units 10/19/2023    2:13 PM  CMP  Sodium 135 - 145 mmol/L 136   Potassium 3.5 - 5.1 mmol/L 4.0    Edinburgh Score:    10/20/2023   10:00 AM  Edinburgh Postnatal Depression Scale Screening Tool  I have been able to laugh and see  the funny side of things. 1  I have looked forward with enjoyment to things. 0  I have blamed myself unnecessarily when things went wrong. 2  I have been anxious or worried for no good reason. 2  I have felt scared or panicky for no good reason. 2  Things have been getting on top of me. 1  I have been so unhappy that I have had difficulty sleeping. 2  I have felt sad or miserable. 1  I have been so unhappy that I have been crying. 1  The thought of harming myself has  occurred to me. 0  Edinburgh Postnatal Depression Scale Total 12   No data recorded  After visit meds:  Allergies as of 10/22/2023       Reactions   Beef-derived Drug Products Nausea And Vomiting, Other (See Comments)   Actively vomits. Alpha-gal allergy.     Ultram [tramadol] Other (See Comments)   Hallucinate and heart stop   Augmentin [amoxicillin-pot Clavulanate] Other (See Comments)   Childhood, pt states she can take penicillin Has patient had a PCN reaction causing immediate rash, facial/tongue/throat swelling, SOB or lightheadedness with hypotension: No Has patient had a PCN reaction causing severe rash involving mucus membranes or skin necrosis: No Has patient had a PCN reaction that required hospitalization No Has patient had a PCN reaction occurring within the last 10 years: No If all of the above answers are "NO", then may proceed with Cephalosporin use.   Sudafed [pseudoephedrine] Palpitations, Other (See Comments)   hyperactivity         Medication List     STOP taking these medications    Accu-Chek Guide Test test strip Generic drug: glucose blood   Accu-Chek Guide w/Device Kit   Accu-Chek Softclix Lancets lancets   Blood Pressure Kit Devi   metFORMIN 500 MG 24 hr tablet Commonly known as: GLUCOPHAGE-XR       TAKE these medications    acetaminophen 325 MG tablet Commonly known as: Tylenol Take 2 tablets (650 mg total) by mouth every 6 (six) hours as needed.   cetirizine 10 MG tablet Commonly known as: ZYRTEC Take 10 mg by mouth daily.   docusate sodium 100 MG capsule Commonly known as: Colace Take 1-2 tablets daily for constipation if needed   ferrous sulfate 325 (65 FE) MG EC tablet Take 1 tablet (325 mg total) by mouth every other day.   ibuprofen 800 MG tablet Commonly known as: ADVIL Take 1 tablet (800 mg total) by mouth every 8 (eight) hours.   oxyCODONE 5 MG immediate release tablet Commonly known as: Oxy IR/ROXICODONE Take 1  tablet (5 mg total) by mouth every 6 (six) hours as needed for up to 7 days for moderate pain (pain score 4-6).   PRENATAL VITAMIN PO Take 1 tablet by mouth daily.   sertraline 50 MG tablet Commonly known as: Zoloft Take 1 tablet (50 mg total) by mouth daily.         Discharge home in stable condition Infant Feeding:  Both Infant Disposition:NICU Discharge instruction: per After Visit Summary and Postpartum booklet. Activity: Advance as tolerated. Pelvic rest for 6 weeks.  Diet: routine diet Future Appointments: Future Appointments  Date Time Provider Department Center  11/22/2023 10:55 AM Nobie Putnam, Cyndi Lennert, MD [facility] None   Follow up Visit:  Follow-up Information     [facility] for [facility] at [facility]. Go in 1 week(s).   Specialty: Obstetrics and Gynecology Why: Please follow up  in one week for an incision check Contact information: [address] [place] [address] [phone]                Message sent to [facility] 1/16  Please schedule this patient for a In person postpartum visit in 6 weeks with the following provider: Any provider. Additional Postpartum F/U:Incision check 1 week  High risk pregnancy complicated by:  hx PPROM, placental abruption Delivery mode:  C-Section, Low Transverse Anticipated Birth Control:  BTL done [facility]   10/22/2023 Sharon Seller, DO

## 2023-10-20 LAB — CULTURE, OB URINE
Culture: NO GROWTH
Special Requests: NORMAL

## 2023-10-20 LAB — GC/CHLAMYDIA PROBE AMP (~~LOC~~) NOT AT ARMC
Chlamydia: NEGATIVE
Comment: NEGATIVE
Comment: NORMAL
Neisseria Gonorrhea: NEGATIVE

## 2023-10-20 LAB — CBC
HCT: 23.9 % — ABNORMAL LOW (ref 36.0–46.0)
HCT: 25.5 % — ABNORMAL LOW (ref 36.0–46.0)
Hemoglobin: 7.7 g/dL — ABNORMAL LOW (ref 12.0–15.0)
Hemoglobin: 8.2 g/dL — ABNORMAL LOW (ref 12.0–15.0)
MCH: 26.6 pg (ref 26.0–34.0)
MCH: 26.7 pg (ref 26.0–34.0)
MCHC: 32.2 g/dL (ref 30.0–36.0)
MCHC: 32.2 g/dL (ref 30.0–36.0)
MCV: 82.4 fL (ref 80.0–100.0)
MCV: 83.1 fL (ref 80.0–100.0)
Platelets: 236 10*3/uL (ref 150–400)
Platelets: 241 10*3/uL (ref 150–400)
RBC: 2.9 MIL/uL — ABNORMAL LOW (ref 3.87–5.11)
RBC: 3.07 MIL/uL — ABNORMAL LOW (ref 3.87–5.11)
RDW: 14.8 % (ref 11.5–15.5)
RDW: 15 % (ref 11.5–15.5)
WBC: 15.2 10*3/uL — ABNORMAL HIGH (ref 4.0–10.5)
WBC: 13.8 10*3/uL — ABNORMAL HIGH (ref 4.0–10.5)
nRBC: 0 % (ref 0.0–0.2)
nRBC: 0 % (ref 0.0–0.2)

## 2023-10-20 LAB — RPR: RPR Ser Ql: NONREACTIVE

## 2023-10-20 MED ORDER — DIPHENOXYLATE-ATROPINE 2.5-0.025 MG PO TABS: 2.0000 | ORAL_TABLET | Freq: Four times a day (QID) | ORAL | Status: DC | PRN

## 2023-10-20 MED ORDER — RHO D IMMUNE GLOBULIN 1500 UNIT/2ML IJ SOSY
300.0000 ug | PREFILLED_SYRINGE | Freq: Once | INTRAMUSCULAR | Status: AC
  Administered 2023-10-20: 300 ug via INTRAVENOUS
  Filled 2023-10-20: qty 2

## 2023-10-20 NOTE — Progress Notes (Signed)
Patient ID: Christine Orozco, female   DOB: 1986-08-10, 38 y.o.   MRN: [id number] 1 Day Post-Op Procedure(s) (LRB): CESAREAN SECTION (N/A)  Christine Orozco is a 38 y.o. female patient.   1. Vaginal bleeding in pregnancy, third trimester   2. Gestational diabetes mellitus (GDM) in third trimester, gestational diabetes method of control unspecified   3. History of cesarean section   4. Rh negative state in antepartum period   5. Elevated BP without diagnosis of hypertension     Past Medical History:  Diagnosis Date   Anxiety    Hepatitis B antibody positive    Miscarriage    Preterm premature rupture of membranes (PPROM) in second trimester, antepartum 01/06/2022   Rh negative status during pregnancy 06/07/2017   Will need rhogam   Vaginal Pap smear, abnormal     No past surgical history pertinent negatives on file.  Scheduled Meds:  acetaminophen  1,000 mg Oral Q8H   diphenoxylate-atropine  2 tablet Oral QID   enoxaparin (LOVENOX) injection  40 mg Subcutaneous Q24H   ferrous sulfate  325 mg Oral QODAY   gabapentin  100 mg Oral Q8H   ketorolac  30 mg Intravenous Q6H   Followed by   ibuprofen  800 mg Oral Q8H   methylergonovine  0.2 mg Oral Q6H   prenatal multivitamin  1 tablet Oral Q1200   scopolamine  1 patch Transdermal Once   senna-docusate  2 tablet Oral Daily   sertraline  50 mg Oral Daily   simethicone  80 mg Oral TID PC    Continuous Infusions:  naloxone HCl (NARCAN) 2 mg in dextrose 5 % 250 mL infusion     oxytocin Stopped (10/20/23 0559)    PRN Meds:coconut oil, witch hazel-glycerin **AND** dibucaine, diphenhydrAMINE, diphenhydrAMINE **OR** diphenhydrAMINE, medroxyPROGESTERone, menthol-cetylpyridinium, naloxone **AND** sodium chloride flush, naloxone HCl (NARCAN) 2 mg in dextrose 5 % 250 mL infusion, ondansetron (ZOFRAN) IV, oxyCODONE, simethicone, zolpidem  Allergies  Allergen Reactions   Beef-Derived Drug Products Nausea And Vomiting and Other (See Comments)     Actively vomits. Alpha-gal allergy.     Ultram [Tramadol] Other (See Comments)    Hallucinate and heart stop   Augmentin [Amoxicillin-Pot Clavulanate] Other (See Comments)    Childhood, pt states she can take penicillin Has patient had a PCN reaction causing immediate rash, facial/tongue/throat swelling, SOB or lightheadedness with hypotension: No Has patient had a PCN reaction causing severe rash involving mucus membranes or skin necrosis: No Has patient had a PCN reaction that required hospitalization No Has patient had a PCN reaction occurring within the last 10 years: No If all of the above answers are "NO", then may proceed with Cephalosporin use.    Sudafed [Pseudoephedrine] Palpitations and Other (See Comments)    hyperactivity     Principal Problem:   Status post repeat low transverse cesarean section Active Problems:   Rh negative state in antepartum period   Supervision of high risk pregnancy, antepartum   AMA (advanced maternal age) multigravida 35+, unspecified trimester   Hx preterm delivery at [redacted]w[redacted]d   History of preterm premature rupture of membranes (PPROM)   Gestational diabetes mellitus (GDM) in third trimester   Transient hypertension of pregnancy in third trimester   Antepartum placental abruption   Bleeding   Subjective   Pt without complaints Hungry  Pain well managed voiding  Objective   Vitals:   10/19/23 2205 10/19/23 2208 10/20/23 0213 10/20/23 0614  BP: 114/64 114/76 98/63 (!) 99/58  Pulse: Marland Kitchen)  117 88 (!) 113 76  Resp:   17 18  Temp:   98.3 F (36.8 C) 98 F (36.7 C)  TempSrc:   Oral Oral  SpO2: 99%  100% 100%   Vitals:   10/19/23 1545 10/19/23 1600 10/19/23 1627 10/19/23 1711  BP: (!) 114/46 107/74 116/70 124/71   10/19/23 1815 10/19/23 1953 10/19/23 2200 10/19/23 2202  BP: (!) 113/52 (!) 127/56 107/86 111/85   10/19/23 2205 10/19/23 2208 10/20/23 0213 10/20/23 0614  BP: 114/64 114/76 98/63 (!) 99/58     Subjective Objective: Vital  signs (most recent): Blood pressure (!) 99/58, pulse 76, temperature 98 F (36.7 C), temperature source Oral, resp. rate 18, SpO2 100%, unknown if currently breastfeeding.   Gen  WDWN NAD Abdomen  soft benign post op  Incision  dressing is clean dry intact     Latest Ref Rng & Units 10/20/2023    4:09 AM 10/19/2023    2:13 PM 10/19/2023   11:09 AM  CBC  WBC 4.0 - 10.5 K/uL 15.2   13.6   Hemoglobin 12.0 - 15.0 g/dL 7.7  8.8  9.2   Hematocrit 36.0 - 46.0 % 23.9  26.0  28.2   Platelets 150 - 400 K/uL 236   275        Latest Ref Rng & Units 10/19/2023    2:13 PM 10/19/2023   11:09 AM 10/14/2022   12:43 PM  CMP  Glucose 70 - 99 mg/dL  95  696   BUN 6 - 20 mg/dL  8  10   Creatinine 2.95 - 1.00 mg/dL  2.84  1.32   Sodium 440 - 145 mmol/L 136  134  138   Potassium 3.5 - 5.1 mmol/L 4.0  3.8  3.8   Chloride 98 - 111 mmol/L  109  108   CO2 22 - 32 mmol/L  17  19   Calcium 8.9 - 10.3 mg/dL  8.2  10.2   Total Protein 6.5 - 8.1 g/dL  5.5    Total Bilirubin 0.0 - 1.2 mg/dL  0.5    Alkaline Phos 38 - 126 U/L  126    AST 15 - 41 U/L  13    ALT 0 - 44 U/L  15       Assessment & Plan POD#1 repeat LTCS Normal post op course Recheck CBC in am  Lazaro Arms, MD 10/20/2023, 7:42 AM

## 2023-10-20 NOTE — Plan of Care (Signed)
  Problem: Education: ?Goal: Knowledge of General Education information will improve ?Description: Including pain rating scale, medication(s)/side effects and non-pharmacologic comfort measures ?Outcome: Progressing ?  ?Problem: Health Behavior/Discharge Planning: ?Goal: Ability to manage health-related needs will improve ?Outcome: Progressing ?  ?Problem: Clinical Measurements: ?Goal: Diagnostic test results will improve ?Outcome: Progressing ?Goal: Respiratory complications will improve ?Outcome: Progressing ?  ?Problem: Activity: ?Goal: Risk for activity intolerance will decrease ?Outcome: Progressing ?  ?Problem: Nutrition: ?Goal: Adequate nutrition will be maintained ?Outcome: Progressing ?  ?

## 2023-10-20 NOTE — Clinical Social Work Maternal (Signed)
CLINICAL SOCIAL WORK MATERNAL/CHILD NOTE  Patient Details  Name: Christine Orozco MRN: [id number] Date of Birth: 09-13-1986  Date:  10/20/2023  Clinical Social Worker Initiating Note:  Celso Sickle, [place] Date/Time: Initiated:  10/20/23/1332     Child's Name:  Dorna Leitz   Biological Parents:  Mother, Father (Father: Criss Rosales)   Need for Interpreter:  None   Reason for Referral:  Behavioral Health Concerns, Parental Support of Premature Babies < 32 weeks/or Critically Ill babies (Edinburgh:12)   Address:  [address] [place] [address]    Phone number:  [phone] (FOB's number)   Additional phone number:   Household Members/Support Persons (HM/SP):   Household Member/Support Person 1, Household Member/Support Person 2, Household Member/Support Person 3, Household Member/Support Person 4   HM/SP Name Relationship DOB or Age  HM/SP -1 Criss Rosales FOB    HM/SP -2 Fransico Michael Haslacker son 10/26/07  HM/SP -3 Learta Codding. son 11/22/17  HM/SP -4 Burnell Blanks daughter 01/16/22  HM/SP -5        HM/SP -6        HM/SP -7        HM/SP -8          Natural Supports (not living in the home):  Friends   Professional Supports: Paramedic   Employment: Unemployed   Type of Work:     Education:  Engineer, agricultural   Homebound arranged:    Surveyor, quantity Resources:  [employer]   Other Resources:  Archivist Considerations Which May Impact Care:    Strengths:  Ability to meet basic needs  , Understanding of illness, Pediatrician chosen   Psychotropic Medications:         Pediatrician:    Armed forces operational officer area  Pediatrician List:   [facility]  [place]    [place]    [facility]      Pediatrician Fax Number:    Risk Factors/Current Problems:  Scientist, product/process development:  Able to Concentrate  , Alert  , Goal Oriented  , Linear Thinking  , Insightful      Mood/Affect:  Calm  , Happy  , Interested  , Comfortable     CSW Assessment: CSW met with MOB at bedside to complete psychosocial assessment, FOB present. CSW introduced self and explained role. MOB was welcoming, pleasant, and remained engaged during assessment. MOB reported that she resides with FOB and older children. MOB reported that they have some clothes for infant and shared that a friend is gifting a pack n play. MOB reported that she has a car seat and has to ensure that it is not expired. CSW informed parents about [employer]'s [employer] if any assistance is needed obtaining items for infant. Parents reported that a referral for all essential items would be helpful, CSW agreed to complete referral. MOB reported that she was interested in a [facility] referral, CSW agreed to complete referral. CSW inquired about MOB's support system, MOB reported that her Husband/FOB and friend Malachi Bonds) are supports.   CSW and parents discussed infant's NICU admission. CSW informed parents about the NICU, what to expect, and resources/supports while infant is admitted to the NICU. Parents reported that they were familiar with NICU as their daughter was also in the NICU. MOB reported that the NICU admission has been pretty good so far and they feel well informed  about infant's care. MOB endorsed transportation barriers and shared that they don't currently have a car. MOB reported that 30 day bus passes would be helpful, CSW agreed to provide. MOB reported that meal vouchers would also be helpful, CSW provided 3 meal vouchers and explained the process. MOB thanked CSW. Parents denied any questions/concerns regarding the NICU.   CSW asked to speak with MOB privately, FOB left the room.   CSW inquired about MOB's mental health history. MOB reported that she has experienced anxiety and depression on and off since age 48. MOB reported that she is currently taking Zoloft which is helpful. MOB denied any  current symptoms. MOB reported that she is also participating in monthly telehealth appointments with the Integrated Behavioral Health Specialist at her OBGYN office. MOB shared that she also crochets to cope at times. MOB denied any additional mental health history. CSW and MOB discussed elevated edinburgh score 12. MOB attributed elevated edinburgh score to being stuck at home due to the weather. MOB shared that the season also impacts her mood. CSW inquired about how MOB was feeling emotionally since giving birth, MOB reported that she was feeling pretty good, better than expected. MOB presented calm and did not demonstrate any acute mental health signs/symptoms. CSW assessed for safety, MOB denied any SI, HI, and domestic violence.   CSW provided education regarding the baby blues period vs. perinatal mood disorders, discussed treatment and gave resources for mental health follow up if concerns arise.  CSW recommends self-evaluation during the postpartum time period using the New Mom Checklist from Postpartum Progress and encouraged MOB to contact a medical professional if symptoms are noted at any time.    CSW provided review of Sudden Infant Death Syndrome (SIDS) precautions.    CSW completed [facility] referral for requested items.   CSW completed [facility] referral.   CSW provided parents with (2) 30 day bus passes.   CSW identifies no further need for intervention and no barriers to discharge at this time. MOB opted to call CSW if any needs arise versus CSW checking in.   CSW Plan/Description:  Sudden Infant Death Syndrome (SIDS) Education, Psychosocial Support and Ongoing Assessment of Needs, Perinatal Mood and Anxiety Disorder (PMADs) Education, Other Information/Referral to [employer], Other Patient/Family Education, CSW Will Continue to Monitor Umbilical Cord Tissue Drug Screen Results and Make Report if Jamelle Rushing, LCSW 10/20/2023, 1:34 PM

## 2023-10-21 ENCOUNTER — Encounter (HOSPITAL_COMMUNITY): Payer: Self-pay | Admitting: Obstetrics and Gynecology

## 2023-10-21 LAB — RH IG WORKUP (INCLUDES ABO/RH)
Fetal Screen: NEGATIVE
Gestational Age(Wks): 32.1
Unit division: 0

## 2023-10-21 NOTE — Progress Notes (Signed)
POSTPARTUM PROGRESS NOTE  POD #2  Subjective:  Christine Orozco is a 38 y.o. [phone] s/p rLTCS and bilateral salpingectomy at [redacted]w[redacted]d. Today she notes she is doing well. She denies any problems with ambulating, voiding or po intake. Denies nausea or vomiting. She has passed flatus, no BM.  Pain is tolerable with medication.  Lochia minimal Denies fever/chills/chest pain/SOB.  No acute complaints  Objective: Blood pressure 114/64, pulse 81, temperature 97.6 F (36.4 C), temperature source Oral, resp. rate 16, SpO2 100%, unknown if currently breastfeeding.  Physical Exam:  General: alert, cooperative and no distress Chest: no respiratory distress Heart: regular rate and rhythm Abdomen: soft, nontender, +BS Uterine Fundus: firm, appropriately tender Incision: C/D/I with honeycomb DVT Evaluation: No calf swelling or tenderness Extremities: no edema Skin: warm, dry  No results found for this or any previous visit (from the past 24 hours).  Assessment/Plan: Christine Orozco is a 38 y.o. [phone] s/p rLTCS, bilateral salpingectomy at [redacted]w[redacted]d POD#2  -pain controlled -encourage ambulation -Lovenox for DVT prophylaxis  Contraception: salpingectomy Feeding: in NICU  Dispo: continue routine postop care   LOS: 2 days   Myna Hidalgo, DO Faculty Attending, Center for [facility] 10/21/2023, 8:55 AM

## 2023-10-22 ENCOUNTER — Other Ambulatory Visit: Payer: Self-pay | Admitting: Obstetrics & Gynecology

## 2023-10-22 MED ORDER — OXYCODONE HCL 5 MG PO TABS: 5.0000 mg | ORAL_TABLET | Freq: Four times a day (QID) | ORAL | 0 refills | Status: AC | PRN

## 2023-10-22 MED ORDER — ACETAMINOPHEN 325 MG PO TABS: 650.0000 mg | ORAL_TABLET | Freq: Four times a day (QID) | ORAL | 0 refills | Status: DC | PRN

## 2023-10-22 MED ORDER — IBUPROFEN 800 MG PO TABS: 800.0000 mg | ORAL_TABLET | Freq: Three times a day (TID) | ORAL | 0 refills | Status: DC

## 2023-10-22 MED ORDER — DOCUSATE SODIUM 100 MG PO CAPS: ORAL_CAPSULE | ORAL | 0 refills | Status: DC

## 2023-10-23 ENCOUNTER — Telehealth (HOSPITAL_COMMUNITY): Payer: Self-pay | Admitting: *Deleted

## 2023-10-23 DIAGNOSIS — Z1331 Encounter for screening for depression: Secondary | ICD-10-CM

## 2023-10-23 LAB — TYPE AND SCREEN
ABO/RH(D): O NEG
Antibody Screen: POSITIVE
Unit division: 0
Unit division: 0

## 2023-10-23 LAB — BPAM RBC
Blood Product Expiration Date: 202502032359
Blood Product Expiration Date: 202502142359
ISSUE DATE / TIME: 202501111245
Unit Type and Rh: 9500
Unit Type and Rh: 9500

## 2023-10-23 LAB — SURGICAL PATHOLOGY

## 2023-10-23 NOTE — Telephone Encounter (Signed)
EPDS score in the hospital was 12 (answer to question ten was "0-Never").  Placed order for [facility] referral.  Dr. Adrian Blackwater notified by [facility] order.  Salena Saner, RN 10/23/2023 13:38

## 2023-10-24 ENCOUNTER — Encounter: Payer: Medicaid Other | Admitting: Obstetrics and Gynecology

## 2023-10-24 LAB — CULTURE, BETA STREP (GROUP B ONLY)

## 2023-10-31 ENCOUNTER — Telehealth (HOSPITAL_COMMUNITY): Payer: Self-pay | Admitting: *Deleted

## 2023-10-31 NOTE — Telephone Encounter (Signed)
10/31/2023  Name: Christine Orozco MRN: [id number] DOB: [birth year]  Reason for Call:  Transition of Care Hospital Discharge Call  Contact Status: Patient Contact Status: Unable to contact ("call cannot be completed at this time")  Language assistant needed:          Follow-Up Questions:    Inocente Salles Postnatal Depression Scale:  In the Past 7 Days:    PHQ2-9 Depression Scale:     Discharge Follow-up:    Post-discharge interventions: NA  Salena Saner, RN 10/31/2023 15:21

## 2023-11-03 ENCOUNTER — Ambulatory Visit: Payer: Medicaid Other | Admitting: Dietician

## 2023-11-10 ENCOUNTER — Ambulatory Visit: Payer: Medicaid Other

## 2023-11-22 ENCOUNTER — Ambulatory Visit: Payer: Medicaid Other | Admitting: Family Medicine

## 2024-01-30 ENCOUNTER — Encounter

## 2024-02-02 ENCOUNTER — Telehealth: Admitting: Nurse Practitioner

## 2024-02-02 DIAGNOSIS — K047 Periapical abscess without sinus: Secondary | ICD-10-CM | POA: Diagnosis not present

## 2024-02-03 MED ORDER — NAPROXEN 500 MG PO TABS: 500.0000 mg | ORAL_TABLET | Freq: Two times a day (BID) | ORAL | 0 refills | Status: DC

## 2024-02-03 MED ORDER — PENICILLIN V POTASSIUM 500 MG PO TABS: 500.0000 mg | ORAL_TABLET | Freq: Three times a day (TID) | ORAL | 0 refills | Status: AC

## 2024-02-03 NOTE — Progress Notes (Signed)
 I have spent 5 minutes in review of e-visit questionnaire, review and updating patient chart, medical decision making and response to patient.   Claiborne Rigg, NP

## 2024-02-03 NOTE — Progress Notes (Signed)

## 2024-03-28 ENCOUNTER — Telehealth: Admitting: Physician Assistant

## 2024-03-28 DIAGNOSIS — K047 Periapical abscess without sinus: Secondary | ICD-10-CM | POA: Diagnosis not present

## 2024-03-29 MED ORDER — CLINDAMYCIN HCL 300 MG PO CAPS: 300.0000 mg | ORAL_CAPSULE | Freq: Three times a day (TID) | ORAL | 0 refills | Status: AC

## 2024-03-29 NOTE — Progress Notes (Signed)
 E-Visit for Dental Pain  We are sorry that you are not feeling well.  Here is how we plan to help!  Based on what you have shared with me in the questionnaire, it sounds like you have a dental infection.   I have prescribed Clindamycin 300mg  3 times a day for 7 days  It is imperative that you see a dentist within 10 days of this eVisit to determine the cause of the dental pain and be sure it is adequately treated  A toothache or tooth pain is caused when the nerve in the root of a tooth or surrounding a tooth is irritated. Dental (tooth) infection, decay, injury, or loss of a tooth are the most common causes of dental pain. Pain may also occur after an extraction (tooth is pulled out). Pain sometimes originates from other areas and radiates to the jaw, thus appearing to be tooth pain.Bacteria growing inside your mouth can contribute to gum disease and dental decay, both of which can cause pain. A toothache occurs from inflammation of the central portion of the tooth called pulp. The pulp contains nerve endings that are very sensitive to pain. Inflammation to the pulp or pulpitis may be caused by dental cavities, trauma, and infection.    HOME CARE:   For toothaches: Over-the-counter pain medications such as acetaminophen or ibuprofen may be used. Take these as directed on the package while you arrange for a dental appointment. Avoid very cold or hot foods, because they may make the pain worse. You may get relief from biting on a cotton ball soaked in oil of cloves. You can get oil of cloves at most drug stores.  For jaw pain:  Aspirin may be helpful for problems in the joint of the jaw in adults. If pain happens every time you open your mouth widely, the temporomandibular joint (TMJ) may be the source of the pain. Yawning or taking a large bite of food may worsen the pain. An appointment with your doctor or dentist will help you find the cause.     GET HELP RIGHT AWAY IF:  You have a high  fever or chills If you have had a recent head or face injury and develop headache, light headedness, nausea, vomiting, or other symptoms that concern you after an injury to your face or mouth, you could have a more serious injury in addition to your dental injury. A facial rash associated with a toothache: This condition may improve with medication. Contact your doctor for them to decide what is appropriate. Any jaw pain occurring with chest pain: Although jaw pain is most commonly caused by dental disease, it is sometimes referred pain from other areas. People with heart disease, especially people who have had stents placed, people with diabetes, or those who have had heart surgery may have jaw pain as a symptom of heart attack or angina. If your jaw or tooth pain is associated with lightheadedness, sweating, or shortness of breath, you should see a doctor as soon as possible. Trouble swallowing or excessive pain or bleeding from gums: If you have a history of a weakened immune system, diabetes, or steroid use, you may be more susceptible to infections. Infections can often be more severe and extensive or caused by unusual organisms. Dental and gum infections in people with these conditions may require more aggressive treatment. An abscess may need draining or IV antibiotics, for example.  MAKE SURE YOU   Understand these instructions. Will watch your condition. Will get help  right away if you are not doing well or get worse.  Thank you for choosing an e-visit.  Your e-visit answers were reviewed by a board certified advanced clinical practitioner to complete your personal care plan. Depending upon the condition, your plan could have included both over the counter or prescription medications.  Please review your pharmacy choice. Make sure the pharmacy is open so you can pick up prescription now. If there is a problem, you may contact your provider through [employer] and have the prescription  routed to another pharmacy.  Your safety is important to [place]. If you have drug allergies check your prescription carefully.   For the next 24 hours you can use MyChart to ask questions about today's visit, request a non-urgent call back, or ask for a work or school excuse. You will get an email in the next two days asking about your experience. I hope that your e-visit has been valuable and will speed your recovery.  I have spent 5 minutes in review of e-visit questionnaire, review and updating patient chart, medical decision making and response to patient.   Margaretann Loveless, PA-C
